# Patient Record
Sex: Male | Born: 1950 | Race: White | Hispanic: No | Marital: Single | Smoking: Former smoker
Health system: Southern US, Community
[De-identification: ages and names within clinical notes are randomized; demographics above are authoritative.]

## PROBLEM LIST (undated history)

## (undated) DIAGNOSIS — J449 Chronic obstructive pulmonary disease, unspecified: Secondary | ICD-10-CM

## (undated) DIAGNOSIS — I639 Cerebral infarction, unspecified: Secondary | ICD-10-CM

## (undated) DIAGNOSIS — E119 Type 2 diabetes mellitus without complications: Secondary | ICD-10-CM

## (undated) DIAGNOSIS — Z8619 Personal history of other infectious and parasitic diseases: Secondary | ICD-10-CM

## (undated) DIAGNOSIS — I2699 Other pulmonary embolism without acute cor pulmonale: Secondary | ICD-10-CM

## (undated) DIAGNOSIS — I10 Essential (primary) hypertension: Secondary | ICD-10-CM

## (undated) HISTORY — PX: CATARACT EXTRACTION: SUR2

## (undated) HISTORY — PX: BACK SURGERY: SHX140

## (undated) HISTORY — PX: KNEE SURGERY: SHX244

## (undated) HISTORY — PX: CERVICAL FUSION: SHX112

## (undated) HISTORY — PX: HIP PINNING: SHX1757

---

## 2006-06-12 ENCOUNTER — Ambulatory Visit: Payer: Self-pay | Admitting: Cardiology

## 2008-03-19 ENCOUNTER — Inpatient Hospital Stay (HOSPITAL_COMMUNITY): Admission: EM | Admit: 2008-03-19 | Discharge: 2008-03-21 | Payer: Self-pay | Admitting: Emergency Medicine

## 2008-03-21 ENCOUNTER — Encounter (INDEPENDENT_AMBULATORY_CARE_PROVIDER_SITE_OTHER): Payer: Self-pay | Admitting: Emergency Medicine

## 2010-01-07 IMAGING — CR DG CHEST 1V PORT
1 series · 1 of 1 positions shown · non-contrast
Comparison: None.

CLINICAL DATA: Chest pain.  History of hypertension and diabetes.
Smoker.

PORTABLE CHEST - 1 VIEW [DATE]/2110 1111 hours:

[AP]
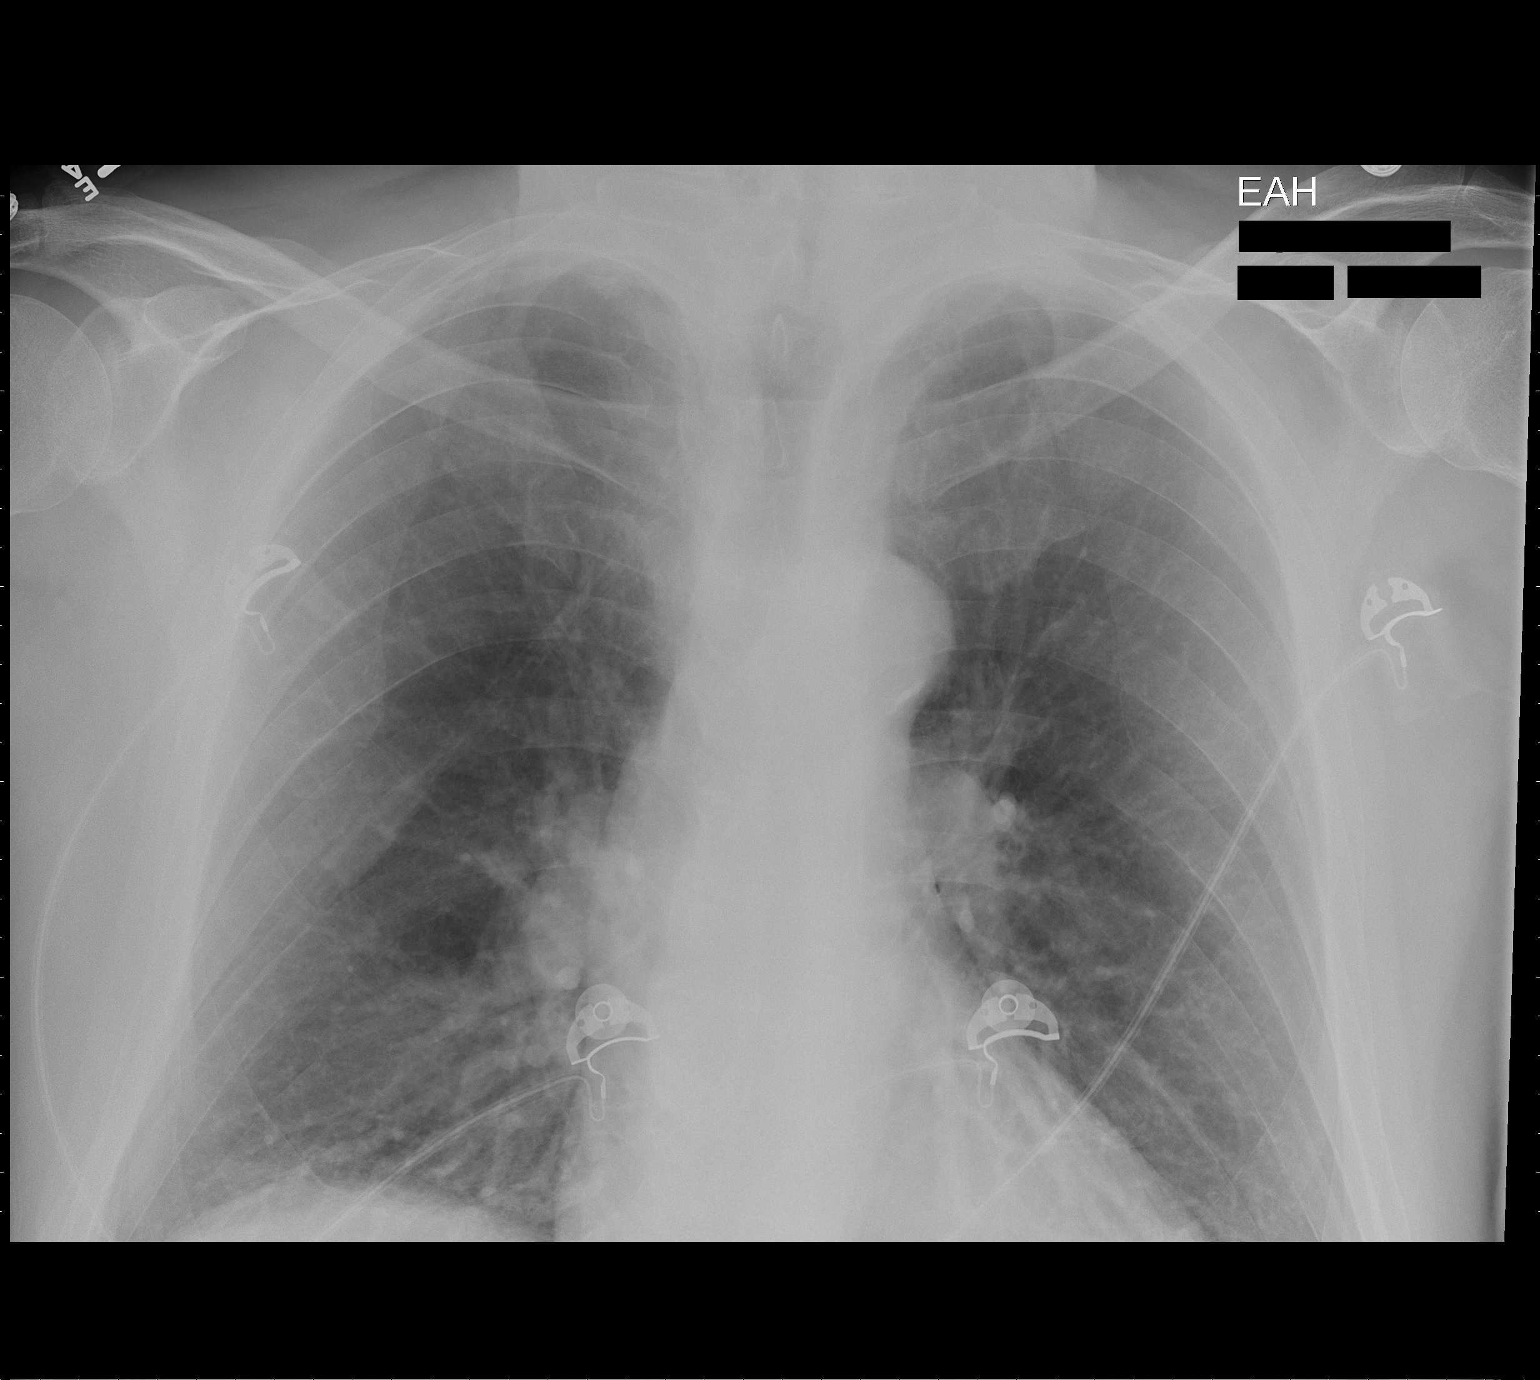

[1 of 1 positions shown; findings below may reference images not displayed]

FINDINGS: Heart size upper normal to perhaps slightly enlarged for
the AP portable technique.  Thoracic aorta mildly atherosclerotic.
Hilar and mediastinal contours otherwise unremarkable.  Pulmonary
vascularity normal.  Early emphysematous changes suspected in the
upper lobes, right greater than left, with vascular redistribution
to the mid and lower lungs.  No localized airspace consolidation.
No visible pleural effusions.
IMPRESSION: Probable COPD/emphysema.  No acute cardiopulmonary disease.

## 2010-08-07 NOTE — H&P (Signed)
NAME:  KYLAR, LEONHARDT                 ACCOUNT NO.:  [id number]   MEDICAL RECORD NO.:  [id number]          PATIENT TYPE:  INP   LOCATION:  2001                         FACILITY:  [facility]   PHYSICIAN:  Lonia Blood, M.D.      DATE OF BIRTH:  02-Mar-1951   DATE OF ADMISSION:  03/18/2008  DATE OF DISCHARGE:                              HISTORY & PHYSICAL   PRIMARY CARE PHYSICIAN:  Production assistant, radio.   PRESENTING COMPLAINT:  Chest pain.   HISTORY OF PRESENT ILLNESS:  The patient is a 60 year old gentleman with  multiple risk factors for coronary artery disease including  hypertension, dyslipidemia, diabetes according to the patient as well as  some tobacco abuse who presented with bilateral chest pain.  Pain was  rated as 6 to 7 out of 10.  Involved left and right side of the chest.  No aggravating or relieving factor.  Also associated with some shortness  of breath.  No fever, no cough.  He reportedly had a similar episode 14  years ago where he was cathed, we have no records of that.  He denied  any nausea, vomiting or diaphoresis.  He denied any abdominal pain, he  denied any diarrhea, constipation.  Patient has history of restless leg  syndrome and apparently has had problem with that with some swelling of  his lower extremities.  His chest pain has been relieved with the use of  aspirin and sublingual nitroglycerin.   PAST MEDICAL HISTORY:  1. Dyslipidemia.  2. Hypertension.  3. Tobacco abuse.  4. Prior history of previous coronary artery disease although the      patient said there was no stents or any blockage seen back 14 years      ago when he had a cath.   ALLERGIES:  He has NO KNOWN DRUG ALLERGIES.   MEDICATIONS:  Include simvastatin and some blood pressure medicine the  patient is not aware of.  The patient also reportedly has diabetes but  not on any medication.   SOCIAL HISTORY:  Patient is married and lives in [place] with his wife.  He smokes about a half to  one pack per day.  Denied any alcohol or IV  drug use.   FAMILY HISTORY:  No significant history of coronary artery disease.   REVIEW OF SYSTEMS:  A 14-point review of system was negative except per  HPI.   EXAM:  Temperature 97.4, blood pressure 125/76, pulse 78, respiratory  rate 18, sats 96% on room air.  GENERAL:  He is awake, alert, oriented, pleasant man in no acute  distress.  HEENT:  PERRL, EOMI.  NECK:  Supple, no JVD, no lymphadenopathy.  RESPIRATORY:  He has good air entry bilaterally, no wheezes, no rales.  CARDIOVASCULAR:  He has S1-S2, no murmur.  ABDOMEN:  Soft and nontender with positive bowel sounds.  EXTREMITIES:  No edema, cyanosis or clubbing.   LABS:  White count 7.2, hemoglobin 14.5, platelet count 317.  PT 12.4,  INR 0.9.  Sodium 138, potassium 4, chloride 105, CO2 26, glucose 100,  BUN 14, creatinine  0.17 and calcium 9.2.  CK 133, MB 2.8.  Magnesium  2.1, troponin 0.02.  Urine drug screen is negative.  Chest x-ray shows  probable COPD and emphysema, no acute cardiopulmonary disease.  EKG  showed no acute changes.   ASSESSMENT:  Therefore this a 60 year old gentleman with risk factors  significant for coronary artery disease presenting with what appears to  be chest pain.  Other possible differentials is pulmonary embolism or  esophagitis.   PLAN:  1. Chest pain.  Will admit the patient for MI rule out.  Will keep him      as an observation.  I will also rule out PE by doing a chest CT      with contrast for PE protocol.  Also put on nitroglycerin, pain      control.  I will also put him some Lovenox.  If patient's enzymes      are negative he is a high risk patient that still will require a      stress test at this point at minimum.  2. Tobacco abuse.  The patient has been counseled and will be offered      nicotine patch as needed.  3. Dyslipidemia.  Will check fasting lipid panel and continue with      simvastatin in the hospital.  4. Hypertension,  blood pressure seems well controlled now.  Not clear      what the patient is taking at home but we will get his drug list      and continue with his home therapy as much as possible.  In the      meantime I will put him some beta blockers.  Otherwise, further      treatment will depend on how the patient does in the hospital.      Lonia Blood, M.D.  Electronically Signed     LG/MEDQ  D:  03/19/2008  T:  03/19/2008  Job:  [id number]

## 2010-08-07 NOTE — Discharge Summary (Signed)
Aaron Leon, Aaron Leon                 ACCOUNT NO.:  [id number]   MEDICAL RECORD NO.:  [id number]          PATIENT TYPE:  INP   LOCATION:  2001                         FACILITY:  [facility]   PHYSICIAN:  Richarda Overlie, MD       DATE OF BIRTH:  08-Jul-1950   DATE OF ADMISSION:  03/18/2008  DATE OF DISCHARGE:  03/21/2008                               DISCHARGE SUMMARY   DISCHARGE DIAGNOSES:  1. Chest pain, ruled out for acute coronary syndrome.  2. Nicotine dependence.  3. Dyslipidemia.  4. Hypertension.   SUBJECTIVE:  This is a 60 year old male with multiple cardiovascular  risk factors who presents to the ER with a chief complaint of pain 7/10,  in the left and the right side of his chest, described as crampy  throbbing pain without any aggravating or relieving factors, not  associated with any fever.  He did not describe any increasing frequency  or duration of this pain.  He did state that he had a similar episode 14  years ago for which he had a cardiac cath of which no records are  available.  In the ER, the patient was found to be hemodynamically  stable.  Chest x-ray showed COPD with emphysema.  No acute  cardiopulmonary disease.  EKG was normal.  Because of his history of  smoking, the patient had a CT angio that did not show any evidence of  pulmonary embolism, just mild cardiomegaly and a small hiatal hernia.  [facility] was consulted who recommended a  cardiac cath because of his multiple risk factors.  The patient was  found to have an ejection fraction of 55%, no mitral regurgitation or  aortic stenosis.  No significant coronary artery disease, just moderate  RCA/LAD disease and mild disease of the LAD and the diagonal.  Medical  management was recommended and the patient was advised to be observed  for 4-5 hours and then to be sent home.  The patient has done well post  cardiac cath without any obvious bleeding or complications.  He is  hemodynamically stable at the time of discharge.  Discharge vital signs:  139/77, pulse 68, respirations 18, temperature 98.4, 92% on room air.   DISCHARGE MEDICATIONS:  1. Simvastatin 80 mg p.o. daily.  2. Norvasc 10 mg p.o. daily.  3. Nicotine patch 21 mg per day x6 weeks, 40 mg per day x2 weeks and 7      mg per day x2 weeks.  4. Toprol XL 50 mg p.o. daily.  5. Diclofenac 75 mg p.o. twice a day.  6. Zolpidem 10 mg p.o. daily.  7. Gabapentin 300 mg four times a day.  8. Oxybutynin 5 mg p.o. daily.  9. Nexium 40 mg p.o. daily.  10.Plavix 75 mg p.o. daily.  11.Zoloft 10 mg p.o. daily.  12.Famotidine 40 mg p.o. daily.  13.Moexipril 50 mg twice a day.  14.ProAir as needed.  15.Hold glyburide and metformin until March 24, 2008.   Follow up with [facility] in 5-7 days.  The patient  advised to call his  PCP and set up this appointment because of late  discharge.      Richarda Overlie, MD  Electronically Signed     NA/MEDQ  D:  03/21/2008  T:  03/21/2008  Job:  [id number]

## 2010-08-07 NOTE — Cardiovascular Report (Signed)
NAME:  HART, HAAS NO.:  [id number]   MEDICAL RECORD NO.:  [id number]          PATIENT TYPE:  INP   LOCATION:  2001                         FACILITY:  [facility]   PHYSICIAN:  Antonieta Iba, MD   DATE OF BIRTH:  1950/12/19   DATE OF PROCEDURE:  03/19/2008  DATE OF DISCHARGE:  03/21/2008                            CARDIAC CATHETERIZATION   REFERRING PHYSICIAN:  Vonna Kotyk R. Jacinto Halim, MD   PRIMARY CARE PHYSICIAN:  Ursula Beath, MD   PERFORMING PHYSICIAN:  Antonieta Iba, MD   INDICATIONS FOR PROCEDURE:  Aaron Leon is a very pleasant 60 year old  gentleman with a history of diabetes, hyperlipidemia with recent  episodes of chest discomfort.  He presents to the cardiac  catheterization lab for evaluation of his coronary anatomy.   PROCEDURE DETAILS:  The risks and benefits of the procedure was  discussed with [name] and consent was obtained.  He was brought to  the catheterization lab and prepped and draped in usual sterile fashion.  A modified Seldinger technique was used to engage the right femoral  artery and a 6-French Judkins left #4 and right #4 catheter were used to  engage the left main and right coronary artery respectively.  Hand  injection of contrast was used to visualize the coronary anatomy and  recorded with cinematography.  A 6-French pigtail catheter was used to  cross the aortic valve into the left ventricle and injection under  cinematography was used to record the left ventricular systolic  function.  At the end of the case, the catheters and sheaths were  removed and manual compression was held to obtain hemostasis.  No  complications were reported.   CORONARY ANATOMY:  Left main:  The left main is a moderate-to-large size  vessel that bifurcates into the LAD and the left circumflex.  There is  no significant disease noted.   Left anterior descending:  The LAD is a moderate-to-large sized vessel  that extends to the apex.  There is one  diagonal branch.  The LAD has  30% mid and 60% mid to distal LAD disease.  There is 40% proximal  diagonal disease that is a long tubular lesion.   Left circumflex:  The left circumflex is a moderate-sized vessel that  has 2 obtuse marginal branches.  There is no significant disease noted.   Ramus/high obtuse marginal branch:  Moderate-sized vessel that has no  significant disease.   Right coronary artery:  The RCA is a dominant branch that extends  distally and bifurcates with a PDA and PO branch.  There is 50% mid to  distal RCA disease prior to bifurcation of the PO and PD branches.   On LV gram:  The ejection fraction estimated at 55%.  There is no  significant mitral regurgitation or aortic stenosis.   FINAL IMPRESSION:  Mild-to-moderate right coronary artery and left  anterior descending disease with mid left anterior descending and the  proximal diagonal disease.  Medical management is recommended.  The  patient will remain flat for 4-5 hours and will be discharged today.  He  will followup in 1 weeks' time with Dr. Jacinto Halim for further evaluation of  his medications.      Antonieta Iba, MD  Electronically Signed     TJG/MEDQ  D:  03/23/2008  T:  03/24/2008  Job:  [id number]   cc:   Ursula Beath, MD

## 2010-12-28 LAB — CBC
HCT: 44.6 % (ref 39.0–52.0)
Hemoglobin: 14.5 g/dL (ref 13.0–17.0)
MCHC: 32.9 g/dL (ref 30.0–36.0)
MCHC: 33.2 g/dL (ref 30.0–36.0)
MCV: 80 fL (ref 78.0–100.0)
MCV: 80.5 fL (ref 78.0–100.0)
Platelets: 281 10*3/uL (ref 150–400)
Platelets: 317 10*3/uL (ref 150–400)
RBC: 5.2 MIL/uL (ref 4.22–5.81)
RDW: 18.2 % — ABNORMAL HIGH (ref 11.5–15.5)
WBC: 7.2 10*3/uL (ref 4.0–10.5)

## 2010-12-28 LAB — GLUCOSE, CAPILLARY
Glucose-Capillary: 132 mg/dL — ABNORMAL HIGH (ref 70–99)
Glucose-Capillary: 137 mg/dL — ABNORMAL HIGH (ref 70–99)
Glucose-Capillary: 155 mg/dL — ABNORMAL HIGH (ref 70–99)
Glucose-Capillary: 163 mg/dL — ABNORMAL HIGH (ref 70–99)
Glucose-Capillary: 207 mg/dL — ABNORMAL HIGH (ref 70–99)

## 2010-12-28 LAB — CARDIAC PANEL(CRET KIN+CKTOT+MB+TROPI)
Relative Index: INVALID (ref 0.0–2.5)
Troponin I: 0.01 ng/mL (ref 0.00–0.06)

## 2010-12-28 LAB — COMPREHENSIVE METABOLIC PANEL
AST: 14 U/L (ref 0–37)
CO2: 25 mEq/L (ref 19–32)
Calcium: 8.5 mg/dL (ref 8.4–10.5)
Creatinine, Ser: 0.73 mg/dL (ref 0.4–1.5)
GFR calc Af Amer: >60 mL/min (ref >60)
GFR calc non Af Amer: >60 mL/min (ref >60)

## 2010-12-28 LAB — HEMOGLOBIN A1C
Hgb A1c MFr Bld: 6.8 % — ABNORMAL HIGH (ref 4.6–6.1)
Mean Plasma Glucose: 148 mg/dL

## 2010-12-28 LAB — POCT I-STAT, CHEM 8
BUN: 16 mg/dL (ref 6–23)
HCT: 46 % (ref 39.0–52.0)
Hemoglobin: 15.6 g/dL (ref 13.0–17.0)
Sodium: 140 mEq/L (ref 135–145)
TCO2: 27 mmol/L (ref 0–100)

## 2010-12-28 LAB — BASIC METABOLIC PANEL
BUN: 7 mg/dL (ref 6–23)
CO2: 25 mEq/L (ref 19–32)
Chloride: 108 mEq/L (ref 96–112)
Creatinine, Ser: 0.65 mg/dL (ref 0.4–1.5)
GFR calc Af Amer: >60 mL/min (ref >60)
GFR calc non Af Amer: >60 mL/min (ref >60)
Glucose, Bld: 142 mg/dL — ABNORMAL HIGH (ref 70–99)
Potassium: 4 mEq/L (ref 3.5–5.1)
Sodium: 138 mEq/L (ref 135–145)

## 2010-12-28 LAB — CK TOTAL AND CKMB (NOT AT ARMC)
CK, MB: 1.9 ng/mL (ref 0.3–4.0)
Relative Index: 2.1 (ref 0.0–2.5)
Relative Index: 2.3 (ref 0.0–2.5)
Relative Index: INVALID (ref 0.0–2.5)
Total CK: 87 U/L (ref 7–232)

## 2010-12-28 LAB — POCT CARDIAC MARKERS: Myoglobin, poc: 49.8 ng/mL (ref 12–200)

## 2010-12-28 LAB — TSH: TSH: 1.694 u[IU]/mL (ref 0.350–4.500)

## 2010-12-28 LAB — RAPID URINE DRUG SCREEN, HOSP PERFORMED
Cocaine: NOT DETECTED
Tetrahydrocannabinol: NOT DETECTED

## 2010-12-28 LAB — TROPONIN I: Troponin I: <0.01 ng/mL (ref 0.00–0.06)

## 2010-12-28 LAB — DIFFERENTIAL
Eosinophils Relative: 2 % (ref 0–5)
Lymphocytes Relative: 23 % (ref 12–46)
Lymphs Abs: 1.8 10*3/uL (ref 0.7–4.0)
Neutrophils Relative %: 65 % (ref 43–77)

## 2010-12-28 LAB — B-NATRIURETIC PEPTIDE (CONVERTED LAB): Pro B Natriuretic peptide (BNP): <30 pg/mL (ref 0.0–100.0)

## 2010-12-28 LAB — APTT: aPTT: 28 seconds (ref 24–37)

## 2010-12-28 LAB — MAGNESIUM
Magnesium: 1.9 mg/dL (ref 1.5–2.5)
Magnesium: 2.1 mg/dL (ref 1.5–2.5)

## 2018-06-01 ENCOUNTER — Other Ambulatory Visit: Payer: Self-pay | Admitting: Internal Medicine

## 2018-06-01 DIAGNOSIS — Z0189 Encounter for other specified special examinations: Secondary | ICD-10-CM

## 2018-07-08 ENCOUNTER — Other Ambulatory Visit: Payer: Self-pay

## 2018-07-08 ENCOUNTER — Ambulatory Visit (INDEPENDENT_AMBULATORY_CARE_PROVIDER_SITE_OTHER): Payer: No Typology Code available for payment source

## 2018-07-08 DIAGNOSIS — Z0189 Encounter for other specified special examinations: Secondary | ICD-10-CM | POA: Diagnosis not present

## 2019-06-19 ENCOUNTER — Encounter (HOSPITAL_COMMUNITY): Payer: Self-pay | Admitting: Emergency Medicine

## 2019-06-19 ENCOUNTER — Other Ambulatory Visit: Payer: Self-pay

## 2019-06-19 ENCOUNTER — Emergency Department (HOSPITAL_COMMUNITY)
Admission: EM | Admit: 2019-06-19 | Discharge: 2019-06-19 | Disposition: A | Discharge status: Home / Self Care | Payer: No Typology Code available for payment source | Attending: Emergency Medicine | Admitting: Emergency Medicine

## 2019-06-19 DIAGNOSIS — I10 Essential (primary) hypertension: Secondary | ICD-10-CM | POA: Diagnosis not present

## 2019-06-19 DIAGNOSIS — Z87891 Personal history of nicotine dependence: Secondary | ICD-10-CM | POA: Insufficient documentation

## 2019-06-19 DIAGNOSIS — Z794 Long term (current) use of insulin: Secondary | ICD-10-CM | POA: Diagnosis not present

## 2019-06-19 DIAGNOSIS — R1031 Right lower quadrant pain: Secondary | ICD-10-CM | POA: Diagnosis present

## 2019-06-19 DIAGNOSIS — J449 Chronic obstructive pulmonary disease, unspecified: Secondary | ICD-10-CM | POA: Insufficient documentation

## 2019-06-19 DIAGNOSIS — K439 Ventral hernia without obstruction or gangrene: Secondary | ICD-10-CM | POA: Insufficient documentation

## 2019-06-19 DIAGNOSIS — Z79899 Other long term (current) drug therapy: Secondary | ICD-10-CM | POA: Insufficient documentation

## 2019-06-19 DIAGNOSIS — E119 Type 2 diabetes mellitus without complications: Secondary | ICD-10-CM | POA: Diagnosis not present

## 2019-06-19 DIAGNOSIS — Z7982 Long term (current) use of aspirin: Secondary | ICD-10-CM | POA: Diagnosis not present

## 2019-06-19 HISTORY — DX: Chronic obstructive pulmonary disease, unspecified: J44.9

## 2019-06-19 HISTORY — DX: Essential (primary) hypertension: I10

## 2019-06-19 HISTORY — DX: Type 2 diabetes mellitus without complications: E11.9

## 2019-06-19 HISTORY — DX: Cerebral infarction, unspecified: I63.9

## 2019-06-19 NOTE — ED Triage Notes (Signed)
Pt c/o right sided groin pain that started 1 month ago. Pt states the pain has gotten worse the last 2 weeks. Pt has known hernia. Pt stating that it is tender to touch.

## 2019-06-19 NOTE — Discharge Instructions (Signed)
Follow up with the surgeon on Monday as scheduled

## 2019-06-19 NOTE — ED Provider Notes (Signed)
[facility] EMERGENCY DEPARTMENT Provider Note   CSN: [id number] Arrival date & time: 06/19/19  2011     History Chief Complaint  Patient presents with  . Groin Pain    Aaron Leon is a 69 y.o. male.  The history is provided by the patient. No language interpreter was used.  Groin Pain This is a recurrent problem. The problem occurs constantly. The problem has been gradually worsening. Nothing aggravates the symptoms. Nothing relieves the symptoms. He has tried nothing for the symptoms. The treatment provided no relief.   Pt complains of a hernia to right groin.  Pt reports if he stands up it come out.  If he lays down it goes back in.  Pt is scheduled for a consultation with the surgeon at the [facility] on Monday.  Pt recently released from hospital after a fall.     Past Medical History:  Diagnosis Date  . COPD (chronic obstructive pulmonary disease) (HCC)   . Diabetes mellitus without complication (HCC)    "type 2"  . Hypertension   . Stroke [facility])     There are no problems to display for this patient.   Past Surgical History:  Procedure Laterality Date  . BACK SURGERY    . CATARACT EXTRACTION    . CERVICAL FUSION    . KNEE SURGERY         No family history on file.  Social History   Tobacco Use  . Smoking status: Former Smoker    Types: E-cigarettes  . Smokeless tobacco: Never Used  Substance Use Topics  . Alcohol use: Not Currently  . Drug use: Never    Home Medications Prior to Admission medications   Medication Sig Start Date End Date Taking? Authorizing Provider  acetaminophen (TYLENOL) 500 MG tablet Take 500 mg by mouth in the morning and at bedtime.   Yes [provider]  albuterol (PROVENTIL) (2.5 MG/3ML) 0.083% nebulizer solution Take 2.5 mg by nebulization every 6 (six) hours as needed for wheezing or shortness of breath.   Yes [provider]  albuterol (VENTOLIN HFA) 108 (90 Base) MCG/ACT inhaler Inhale 1 puff into the  lungs every 6 (six) hours as needed for wheezing or shortness of breath.   Yes [provider]  amLODipine (NORVASC) 5 MG tablet Take 5 mg by mouth daily.   Yes [provider]  ascorbic acid (VITAMIN C) 500 MG tablet Take 500 mg by mouth daily.   Yes [provider]  aspirin 325 MG tablet Take 325 mg by mouth daily.   Yes [provider]  atorvastatin (LIPITOR) 80 MG tablet Take 80 mg by mouth at bedtime.    Yes [provider]  budesonide-formoterol (SYMBICORT) 80-4.5 MCG/ACT inhaler Inhale 2 puffs into the lungs 2 (two) times daily.   Yes [provider]  cetirizine (ZYRTEC) 10 MG tablet Take 10 mg by mouth in the morning.    Yes [provider]  cholecalciferol (VITAMIN D3) 25 MCG (1000 UNIT) tablet Take 1,000 Units by mouth in the morning and at bedtime.   Yes [provider]  ferrous sulfate 325 (65 FE) MG EC tablet Take 325 mg by mouth in the morning.    Yes [provider]  flunisolide (NASALIDE) 25 MCG/ACT (0.025%) SOLN Place 2 sprays into the nose 2 (two) times daily.   Yes [provider]  gabapentin (NEURONTIN) 300 MG capsule Take 600-900 mg by mouth See admin instructions. 600mg  in the morning and  at noon, then take 900mg  in the evening   Yes [provider]  insulin aspart (NOVOLOG) 100 UNIT/ML injection Inject 1-2 Units into the skin 3 (three) times daily before meals. Per sliding scale   Yes [provider]  insulin detemir (LEVEMIR) 100 UNIT/ML injection Inject 40-50 Units into the skin at bedtime.   Yes [provider]  lisinopril (ZESTRIL) 10 MG tablet Take 10 mg by mouth daily.   Yes [provider]  melatonin 3 MG TABS tablet Take 3 mg by mouth at bedtime.   Yes [provider]  metFORMIN (GLUCOPHAGE) 1000 MG tablet Take 1,000 mg by mouth 2 (two) times daily with a meal.   Yes [provider]  methocarbamol (ROBAXIN) 500 MG tablet Take  500 mg by mouth 4 (four) times daily.   Yes [provider]  metoprolol tartrate (LOPRESSOR) 25 MG tablet Take 25 mg by mouth 2 (two) times daily.   Yes [provider]  omeprazole (PRILOSEC) 20 MG capsule Take 20 mg by mouth in the morning.    Yes [provider]  oxybutynin (DITROPAN) 5 MG tablet Take 5 mg by mouth at bedtime.    Yes [provider]  oxyCODONE-acetaminophen (PERCOCET) 10-325 MG tablet Take 1 tablet by mouth every 6 (six) hours as needed for pain.   Yes [provider]  polyethylene glycol powder (GLYCOLAX/MIRALAX) 17 GM/SCOOP powder Take 17 g by mouth daily as needed for mild constipation or moderate constipation.   Yes [provider]  sertraline (ZOLOFT) 100 MG tablet Take 100 mg by mouth in the morning.    Yes [provider]  tamsulosin (FLOMAX) 0.4 MG CAPS capsule Take 0.4 mg by mouth at bedtime.   Yes [provider]  tiotropium (SPIRIVA) 18 MCG inhalation capsule Place 18 mcg into inhaler and inhale in the morning.    Yes [provider]    Allergies    Barbiturates, Seroquel  [quetiapine], and Shellfish allergy  Review of Systems   Review of Systems  All other systems reviewed and are negative.   Physical Exam Updated Vital Signs BP (!) 149/78 (BP Location: Left Arm)   Pulse 73   Temp 98.2 F (36.8 C) (Oral)   Resp 15   Ht 6' (1.829 m)   Wt 74.8 kg   SpO2 95%   BMI 22.38 kg/m   Physical Exam Vitals and nursing note reviewed.  Constitutional:      Appearance: He is well-developed.  HENT:     Head: Normocephalic and atraumatic.  Eyes:     Conjunctiva/sclera: Conjunctivae normal.  Cardiovascular:     Rate and Rhythm: Normal rate and regular rhythm.  Pulmonary:     Effort: Pulmonary effort is normal. No respiratory distress.  Abdominal:     Palpations: Abdomen is soft.     Tenderness: There is abdominal tenderness.     Comments: Palpable defect right lower abdomen,   No evidence of incarceration   Musculoskeletal:        General: Normal range of motion.  Skin:    General: Skin is warm.  Neurological:     General: No focal deficit present.     Mental Status: He is alert.  Psychiatric:        Mood and Affect: Mood normal.     ED Results / Procedures / Treatments   Labs (all labs ordered are listed, but only abnormal results are displayed) Labs Reviewed - No data to display  EKG None  Radiology No results found.  Procedures Procedures (including critical care time)  Medications Ordered in ED Medications - No data to display  ED Course  I have reviewed the triage vital signs and the nursing notes.  Pertinent labs & imaging results that were available during my care of the patient were reviewed by me and considered in my medical decision making (see chart for details).    MDM Rules/Calculators/A&P                       Final Clinical Impression(s) / ED Diagnoses Final diagnoses:  Hernia of abdominal wall    Rx / DC Orders ED Discharge Orders    None    An After Visit Summary was printed and given to the patient.    Osie Cheeks 06/19/19 2130    Raeford Razor, MD 06/20/19 [id number]

## 2019-06-19 NOTE — ED Notes (Signed)
Pt ambulated out of room before signing.

## 2019-06-30 ENCOUNTER — Other Ambulatory Visit (HOSPITAL_COMMUNITY): Payer: Self-pay | Admitting: Specialist

## 2019-06-30 DIAGNOSIS — G20 Parkinson's disease: Secondary | ICD-10-CM

## 2019-06-30 DIAGNOSIS — R2689 Other abnormalities of gait and mobility: Secondary | ICD-10-CM

## 2019-07-22 ENCOUNTER — Ambulatory Visit (HOSPITAL_COMMUNITY)
Admission: RE | Admit: 2019-07-22 | Discharge: 2019-07-22 | Disposition: A | Discharge status: Home / Self Care | Payer: No Typology Code available for payment source | Source: Ambulatory Visit | Attending: Specialist | Admitting: Specialist

## 2019-07-22 ENCOUNTER — Other Ambulatory Visit: Payer: Self-pay

## 2019-07-22 DIAGNOSIS — R2689 Other abnormalities of gait and mobility: Secondary | ICD-10-CM | POA: Diagnosis not present

## 2019-07-22 DIAGNOSIS — G20 Parkinson's disease: Secondary | ICD-10-CM | POA: Diagnosis present

## 2019-07-22 MED ORDER — IODINE STRONG (LUGOLS) 5 % PO SOLN
0.8000 mL | Freq: Once | ORAL | Status: AC
  Administered 2019-07-22: 09:00:00 0.8 mL via ORAL

## 2019-07-22 MED ORDER — IOFLUPANE I 123 185 MBQ/2.5ML IV SOLN
4.6000 | Freq: Once | INTRAVENOUS | Status: AC | PRN
  Administered 2019-07-22: 15:00:00 4.6 via INTRAVENOUS
  Filled 2019-07-22: qty 5

## 2019-07-22 MED ORDER — IODINE STRONG (LUGOLS) 5 % PO SOLN
ORAL | Status: AC
  Filled 2019-07-22: qty 1

## 2021-02-22 HISTORY — PX: TOTAL HIP ARTHROPLASTY: SHX124

## 2021-05-11 IMAGING — NM NM DATSCAN
4 series · 33 of 40 positions shown · non-contrast
Comparison: None.

CLINICAL DATA: 69-year-old male with RIGHT hand tremors. Short-term
memory deficit

EXAM:
NUCLEAR MEDICINE BRAIN IMAGING WITH SPECT  (DaTscan )
TECHNIQUE: SPECT images of the brain were obtained after intravenous injection
of radiopharmaceutical. 4 hour post injection imaging. Appropriate
positioning. 0.8 ml lugols solution administered in a.m
RADIOPHARMACEUTICALS:  4.6 millicuries I 123 Ioflupane

[Series 1: spect - (id) _(id)_cor · 4.1mm · 4.14mm/px · 5 of 128 frames shown]
[frame 11/128]
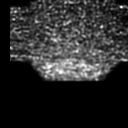
[frame 32/128]
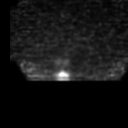
[frame 75/128]
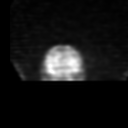
[frame 96/128]
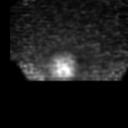
[frame 118/128]
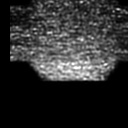

[Series 1: spect - (id) _(id) · 4.1mm · 4.14mm/px · 5 of 128 frames shown]
[frame 11/128]
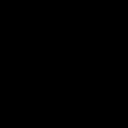
[frame 32/128]
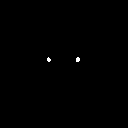
[frame 75/128]
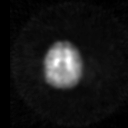
[frame 96/128]
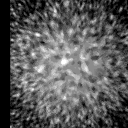
[frame 118/128]
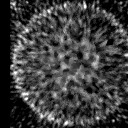

[Series 1: brain spect · 4.14mm/px · 5 of 120 frames shown]
[frame 11/120  full-range]
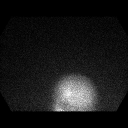
[frame 31/120  full-range]
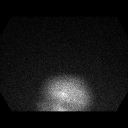
[frame 71/120  full-range]
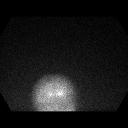
[frame 91/120  full-range]
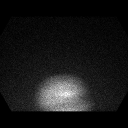
[frame 111/120  full-range]
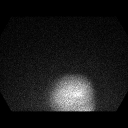

[Series 1013: mpr tra (id) range · 0.97mm/px · 18 of 28 slices shown]
[im 1/28]
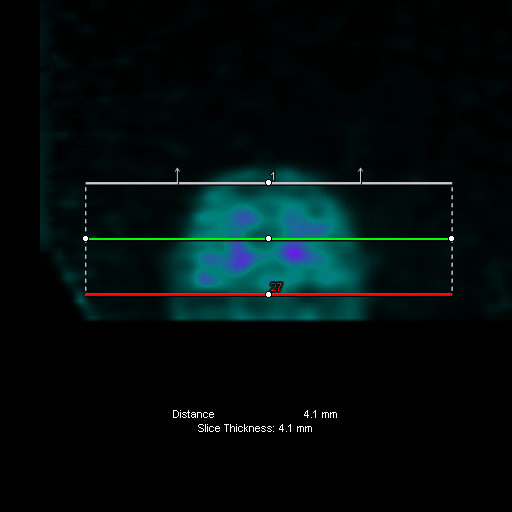
[im 3/28]
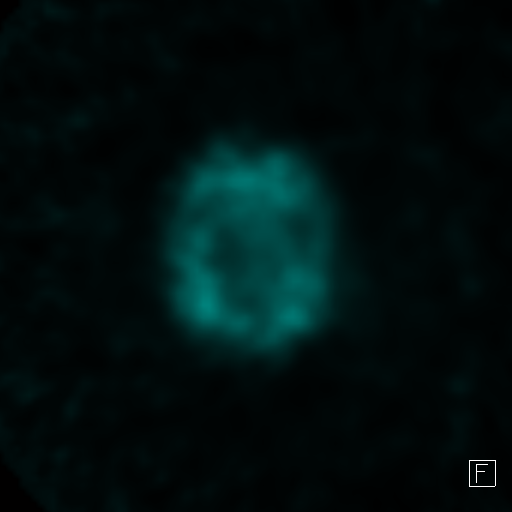
[im 4/28]
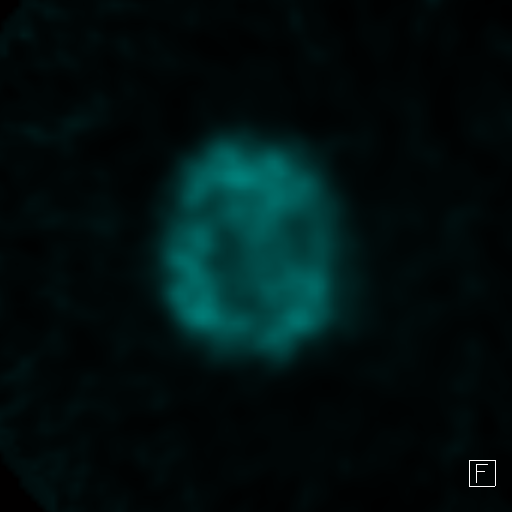
[im 6/28]
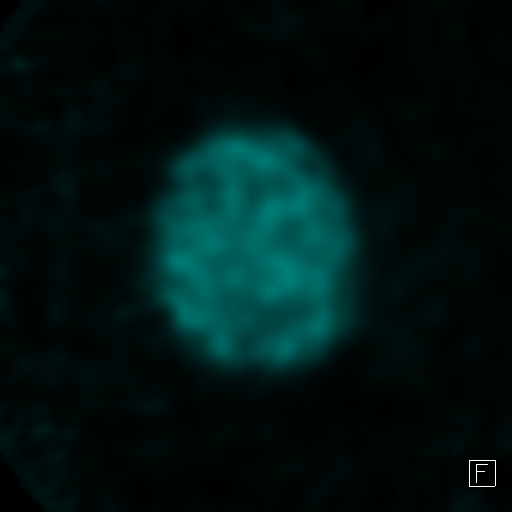
[im 7/28]
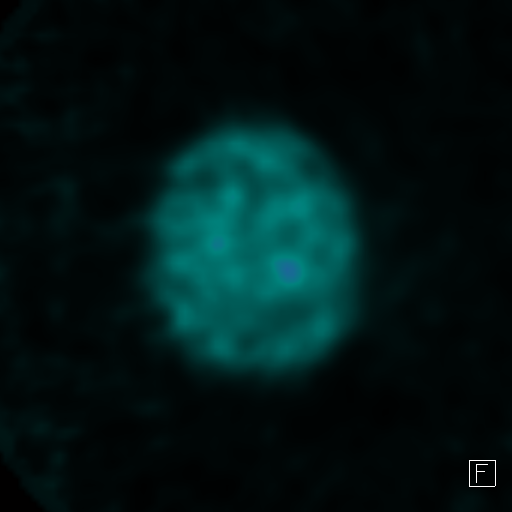
[im 8/28]
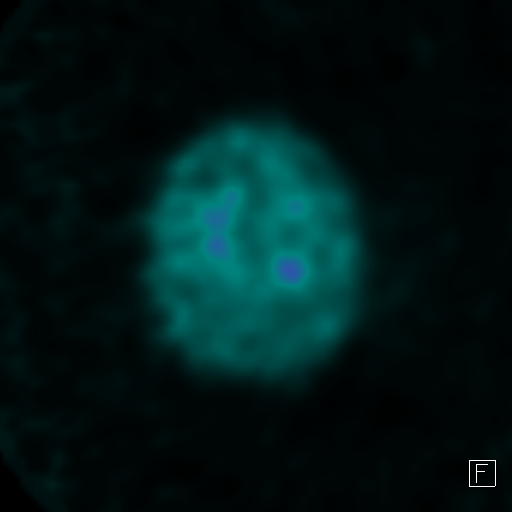
[im 11/28]
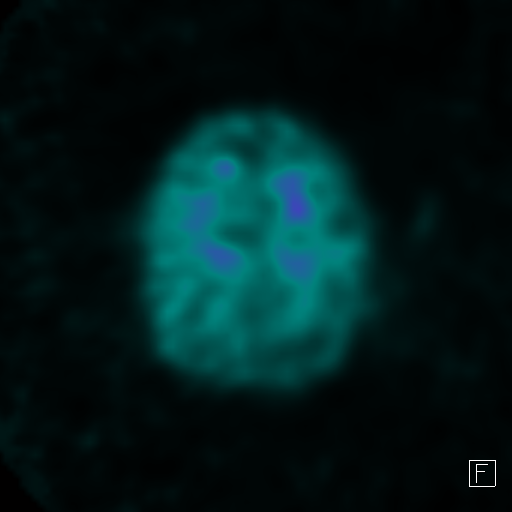
[im 12/28]
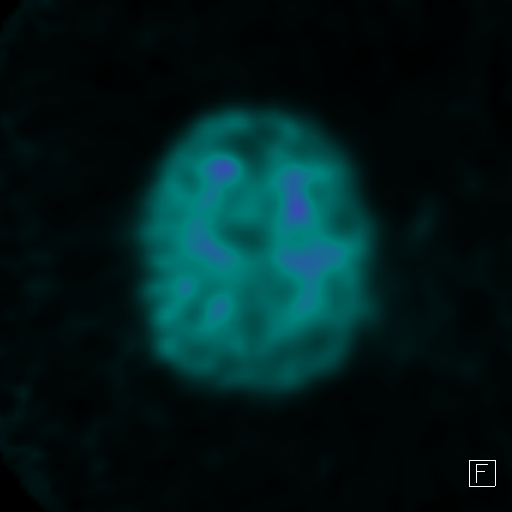
[im 13/28]
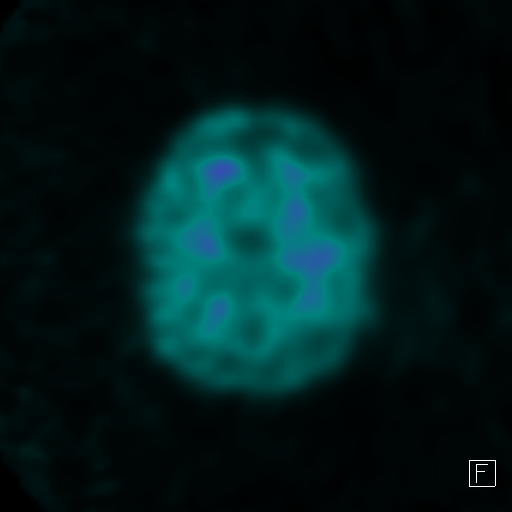
[im 15/28]
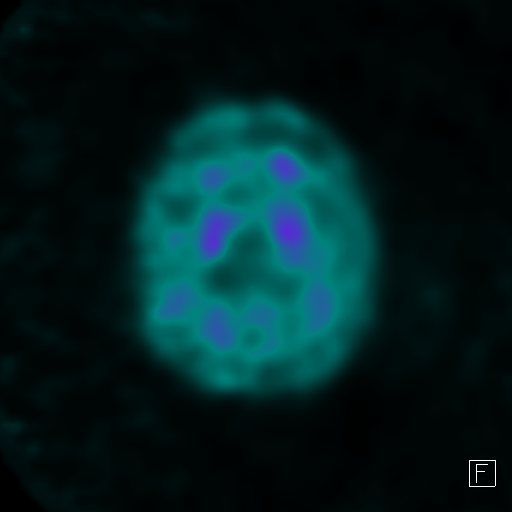
[im 16/28]
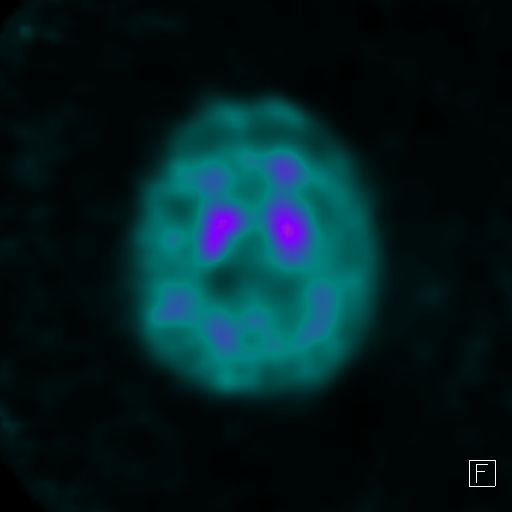
[im 19/28]
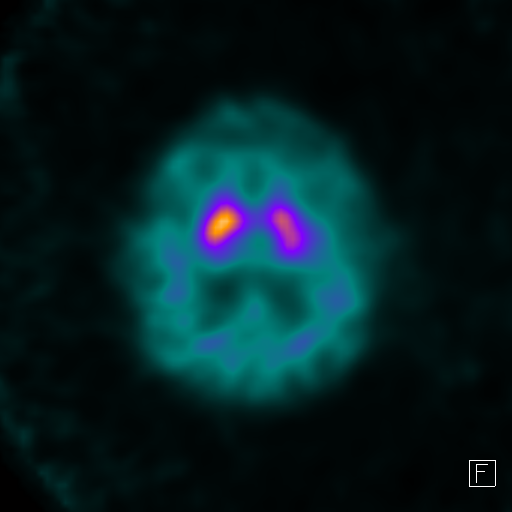
[im 20/28]
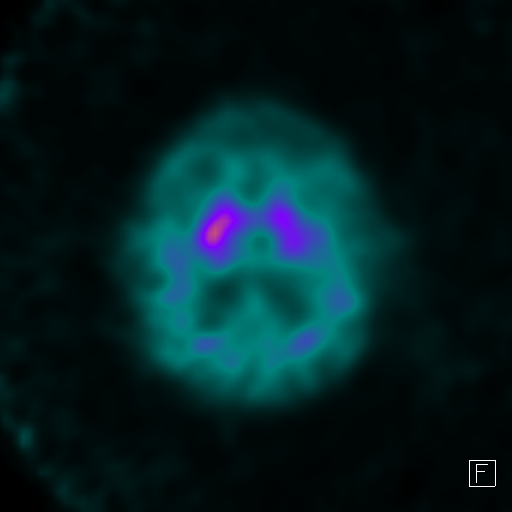
[im 21/28]
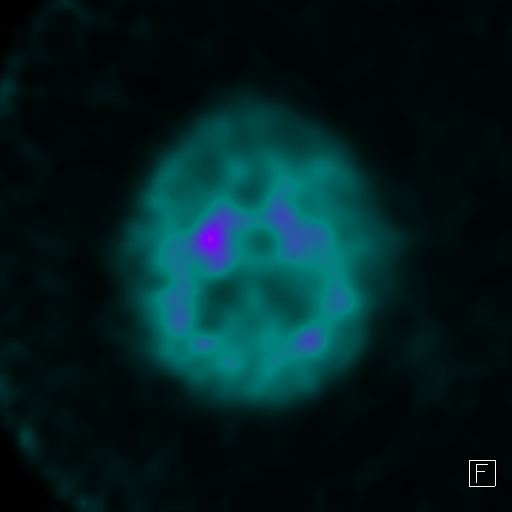
[im 22/28]
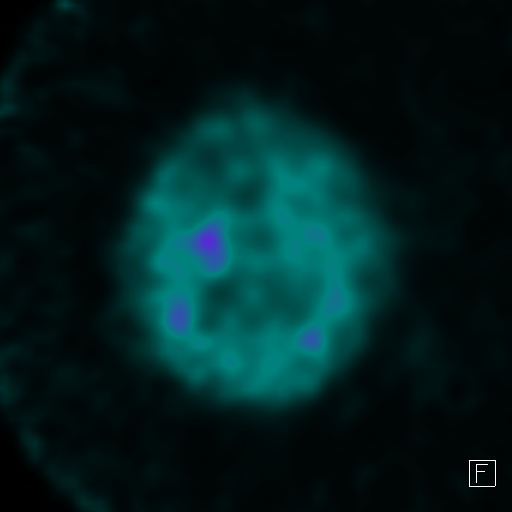
[im 24/28]
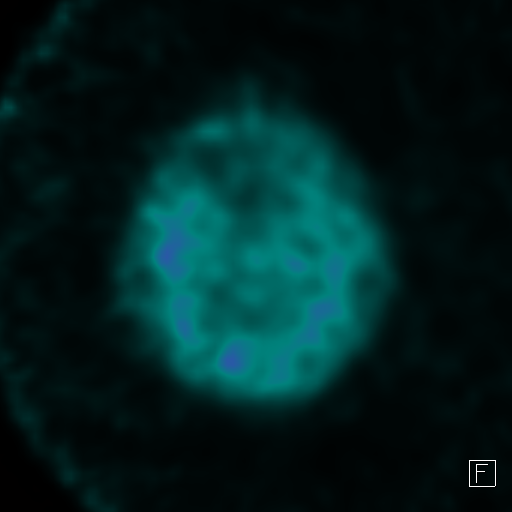
[im 26/28]
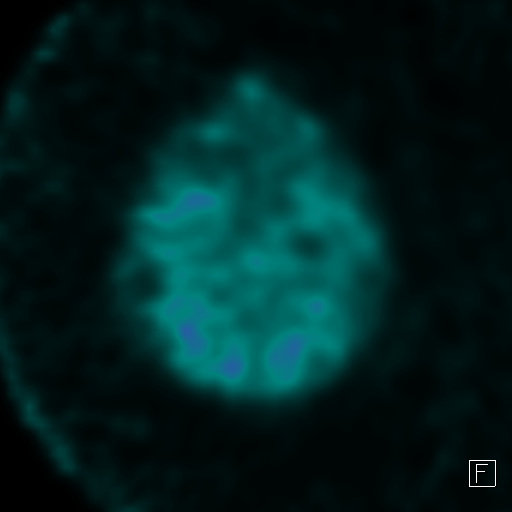
[im 28/28]
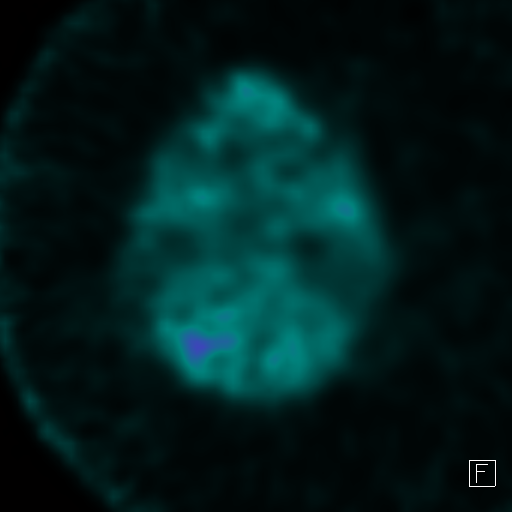

[33 of 40 positions shown; findings below may reference images not displayed]

FINDINGS: The LEFT and RIGHT striata are normal "comma" shaped. However, the
activity in the LEFT striatum is decreased relative to the RIGHT.
This decreases activity favored significant.
IMPRESSION: Asymmetric activity in normal-shaped striatum with LEFT striatal
activity decreased compared to RIGHT. Findings are concerning for
Parkinson's syndrome pathology.

## 2022-06-17 ENCOUNTER — Encounter (HOSPITAL_COMMUNITY): Payer: Self-pay | Admitting: Radiology

## 2022-06-17 ENCOUNTER — Emergency Department (HOSPITAL_COMMUNITY): Payer: No Typology Code available for payment source

## 2022-06-17 ENCOUNTER — Inpatient Hospital Stay (HOSPITAL_COMMUNITY)
Admission: EM | Admit: 2022-06-17 | Discharge: 2022-06-19 | DRG: 175 | Disposition: A | Discharge status: Home Health | Payer: No Typology Code available for payment source | Attending: Family Medicine | Admitting: Family Medicine

## 2022-06-17 ENCOUNTER — Other Ambulatory Visit: Payer: Self-pay

## 2022-06-17 DIAGNOSIS — Z8673 Personal history of transient ischemic attack (TIA), and cerebral infarction without residual deficits: Secondary | ICD-10-CM

## 2022-06-17 DIAGNOSIS — F03918 Unspecified dementia, unspecified severity, with other behavioral disturbance: Secondary | ICD-10-CM | POA: Diagnosis present

## 2022-06-17 DIAGNOSIS — E785 Hyperlipidemia, unspecified: Secondary | ICD-10-CM | POA: Diagnosis present

## 2022-06-17 DIAGNOSIS — I2699 Other pulmonary embolism without acute cor pulmonale: Secondary | ICD-10-CM

## 2022-06-17 DIAGNOSIS — E1165 Type 2 diabetes mellitus with hyperglycemia: Secondary | ICD-10-CM | POA: Diagnosis present

## 2022-06-17 DIAGNOSIS — G8929 Other chronic pain: Secondary | ICD-10-CM | POA: Diagnosis present

## 2022-06-17 DIAGNOSIS — G20A1 Parkinson's disease without dyskinesia, without mention of fluctuations: Secondary | ICD-10-CM | POA: Diagnosis present

## 2022-06-17 DIAGNOSIS — Z6821 Body mass index (BMI) 21.0-21.9, adult: Secondary | ICD-10-CM

## 2022-06-17 DIAGNOSIS — N319 Neuromuscular dysfunction of bladder, unspecified: Secondary | ICD-10-CM | POA: Diagnosis present

## 2022-06-17 DIAGNOSIS — Z794 Long term (current) use of insulin: Secondary | ICD-10-CM

## 2022-06-17 DIAGNOSIS — Z7189 Other specified counseling: Secondary | ICD-10-CM | POA: Diagnosis not present

## 2022-06-17 DIAGNOSIS — Z66 Do not resuscitate: Secondary | ICD-10-CM | POA: Diagnosis present

## 2022-06-17 DIAGNOSIS — R630 Anorexia: Secondary | ICD-10-CM | POA: Diagnosis present

## 2022-06-17 DIAGNOSIS — D75839 Thrombocytosis, unspecified: Secondary | ICD-10-CM | POA: Diagnosis present

## 2022-06-17 DIAGNOSIS — Y846 Urinary catheterization as the cause of abnormal reaction of the patient, or of later complication, without mention of misadventure at the time of the procedure: Secondary | ICD-10-CM | POA: Diagnosis present

## 2022-06-17 DIAGNOSIS — J440 Chronic obstructive pulmonary disease with acute lower respiratory infection: Secondary | ICD-10-CM | POA: Diagnosis present

## 2022-06-17 DIAGNOSIS — Z515 Encounter for palliative care: Secondary | ICD-10-CM | POA: Diagnosis not present

## 2022-06-17 DIAGNOSIS — T83511A Infection and inflammatory reaction due to indwelling urethral catheter, initial encounter: Secondary | ICD-10-CM | POA: Diagnosis present

## 2022-06-17 DIAGNOSIS — Z888 Allergy status to other drugs, medicaments and biological substances status: Secondary | ICD-10-CM

## 2022-06-17 DIAGNOSIS — R4189 Other symptoms and signs involving cognitive functions and awareness: Secondary | ICD-10-CM | POA: Diagnosis present

## 2022-06-17 DIAGNOSIS — R5381 Other malaise: Secondary | ICD-10-CM | POA: Diagnosis present

## 2022-06-17 DIAGNOSIS — E1140 Type 2 diabetes mellitus with diabetic neuropathy, unspecified: Secondary | ICD-10-CM | POA: Diagnosis present

## 2022-06-17 DIAGNOSIS — J189 Pneumonia, unspecified organism: Principal | ICD-10-CM

## 2022-06-17 DIAGNOSIS — J9611 Chronic respiratory failure with hypoxia: Secondary | ICD-10-CM | POA: Diagnosis present

## 2022-06-17 DIAGNOSIS — Z91013 Allergy to seafood: Secondary | ICD-10-CM

## 2022-06-17 DIAGNOSIS — E119 Type 2 diabetes mellitus without complications: Secondary | ICD-10-CM

## 2022-06-17 DIAGNOSIS — Z9981 Dependence on supplemental oxygen: Secondary | ICD-10-CM

## 2022-06-17 DIAGNOSIS — B965 Pseudomonas (aeruginosa) (mallei) (pseudomallei) as the cause of diseases classified elsewhere: Secondary | ICD-10-CM | POA: Diagnosis present

## 2022-06-17 DIAGNOSIS — R319 Hematuria, unspecified: Secondary | ICD-10-CM | POA: Diagnosis not present

## 2022-06-17 DIAGNOSIS — N50819 Testicular pain, unspecified: Secondary | ICD-10-CM | POA: Diagnosis present

## 2022-06-17 DIAGNOSIS — Z7951 Long term (current) use of inhaled steroids: Secondary | ICD-10-CM

## 2022-06-17 DIAGNOSIS — Z7984 Long term (current) use of oral hypoglycemic drugs: Secondary | ICD-10-CM

## 2022-06-17 DIAGNOSIS — I2693 Single subsegmental pulmonary embolism without acute cor pulmonale: Secondary | ICD-10-CM

## 2022-06-17 DIAGNOSIS — N433 Hydrocele, unspecified: Secondary | ICD-10-CM | POA: Diagnosis present

## 2022-06-17 DIAGNOSIS — J181 Lobar pneumonia, unspecified organism: Secondary | ICD-10-CM | POA: Diagnosis present

## 2022-06-17 DIAGNOSIS — N390 Urinary tract infection, site not specified: Secondary | ICD-10-CM

## 2022-06-17 DIAGNOSIS — D649 Anemia, unspecified: Secondary | ICD-10-CM | POA: Diagnosis present

## 2022-06-17 DIAGNOSIS — Z79899 Other long term (current) drug therapy: Secondary | ICD-10-CM

## 2022-06-17 DIAGNOSIS — N3289 Other specified disorders of bladder: Secondary | ICD-10-CM | POA: Diagnosis present

## 2022-06-17 DIAGNOSIS — Z1152 Encounter for screening for COVID-19: Secondary | ICD-10-CM

## 2022-06-17 DIAGNOSIS — R339 Retention of urine, unspecified: Secondary | ICD-10-CM | POA: Diagnosis present

## 2022-06-17 DIAGNOSIS — Z993 Dependence on wheelchair: Secondary | ICD-10-CM

## 2022-06-17 DIAGNOSIS — Z87891 Personal history of nicotine dependence: Secondary | ICD-10-CM

## 2022-06-17 DIAGNOSIS — L729 Follicular cyst of the skin and subcutaneous tissue, unspecified: Secondary | ICD-10-CM | POA: Diagnosis present

## 2022-06-17 DIAGNOSIS — I10 Essential (primary) hypertension: Secondary | ICD-10-CM | POA: Diagnosis present

## 2022-06-17 DIAGNOSIS — Z7982 Long term (current) use of aspirin: Secondary | ICD-10-CM

## 2022-06-17 DIAGNOSIS — I2510 Atherosclerotic heart disease of native coronary artery without angina pectoris: Secondary | ICD-10-CM | POA: Diagnosis present

## 2022-06-17 DIAGNOSIS — J188 Other pneumonia, unspecified organism: Secondary | ICD-10-CM

## 2022-06-17 DIAGNOSIS — F02818 Dementia in other diseases classified elsewhere, unspecified severity, with other behavioral disturbance: Secondary | ICD-10-CM | POA: Diagnosis present

## 2022-06-17 LAB — URINALYSIS, W/ REFLEX TO CULTURE (INFECTION SUSPECTED)
Bacteria, UA: NONE SEEN
Bilirubin Urine: NEGATIVE
Glucose, UA: NEGATIVE mg/dL
Ketones, ur: NEGATIVE mg/dL
Nitrite: NEGATIVE
Protein, ur: 100 mg/dL — AB
RBC / HPF: >50 RBC/hpf (ref 0–5)
Specific Gravity, Urine: 1.015 (ref 1.005–1.030)
WBC, UA: >50 WBC/hpf (ref 0–5)
pH: 5 (ref 5.0–8.0)

## 2022-06-17 LAB — CBC WITH DIFFERENTIAL/PLATELET
Abs Immature Granulocytes: 0.07 10*3/uL (ref 0.00–0.07)
Basophils Absolute: 0 10*3/uL (ref 0.0–0.1)
Basophils Relative: 0 %
Eosinophils Absolute: 0.2 10*3/uL (ref 0.0–0.5)
Eosinophils Relative: 2 %
HCT: 33.2 % — ABNORMAL LOW (ref 39.0–52.0)
Hemoglobin: 10.4 g/dL — ABNORMAL LOW (ref 13.0–17.0)
Immature Granulocytes: 1 %
Lymphocytes Relative: 13 %
Lymphs Abs: 1.3 10*3/uL (ref 0.7–4.0)
MCH: 27.2 pg (ref 26.0–34.0)
MCHC: 31.3 g/dL (ref 30.0–36.0)
MCV: 86.9 fL (ref 80.0–100.0)
Monocytes Absolute: 0.4 10*3/uL (ref 0.1–1.0)
Monocytes Relative: 4 %
Neutro Abs: 8 10*3/uL — ABNORMAL HIGH (ref 1.7–7.7)
Neutrophils Relative %: 80 %
Platelets: 411 10*3/uL — ABNORMAL HIGH (ref 150–400)
RBC: 3.82 MIL/uL — ABNORMAL LOW (ref 4.22–5.81)
RDW: 14.6 % (ref 11.5–15.5)
WBC: 9.9 10*3/uL (ref 4.0–10.5)
nRBC: 0 % (ref 0.0–0.2)

## 2022-06-17 LAB — COMPREHENSIVE METABOLIC PANEL
ALT: 8 U/L (ref 0–44)
AST: 12 U/L — ABNORMAL LOW (ref 15–41)
Albumin: 2.6 g/dL — ABNORMAL LOW (ref 3.5–5.0)
Alkaline Phosphatase: 86 U/L (ref 38–126)
Anion gap: 10 (ref 5–15)
BUN: 10 mg/dL (ref 8–23)
CO2: 26 mmol/L (ref 22–32)
Calcium: 8.2 mg/dL — ABNORMAL LOW (ref 8.9–10.3)
Chloride: 103 mmol/L (ref 98–111)
Creatinine, Ser: 0.58 mg/dL — ABNORMAL LOW (ref 0.61–1.24)
GFR, Estimated: >60 mL/min (ref >60)
Glucose, Bld: 165 mg/dL — ABNORMAL HIGH (ref 70–99)
Potassium: 3.9 mmol/L (ref 3.5–5.1)
Sodium: 139 mmol/L (ref 135–145)
Total Bilirubin: 0.5 mg/dL (ref 0.3–1.2)
Total Protein: 6.1 g/dL — ABNORMAL LOW (ref 6.5–8.1)

## 2022-06-17 LAB — GLUCOSE, CAPILLARY
Glucose-Capillary: 192 mg/dL — ABNORMAL HIGH (ref 70–99)
Glucose-Capillary: 206 mg/dL — ABNORMAL HIGH (ref 70–99)

## 2022-06-17 LAB — TROPONIN I (HIGH SENSITIVITY)
Troponin I (High Sensitivity): 3 ng/L (ref <18)
Troponin I (High Sensitivity): 3 ng/L (ref <18)

## 2022-06-17 LAB — RESP PANEL BY RT-PCR (RSV, FLU A&B, COVID)  RVPGX2
Influenza A by PCR: NEGATIVE
Influenza B by PCR: NEGATIVE
Resp Syncytial Virus by PCR: NEGATIVE
SARS Coronavirus 2 by RT PCR: NEGATIVE

## 2022-06-17 LAB — LACTIC ACID, PLASMA: Lactic Acid, Venous: 1.3 mmol/L (ref 0.5–1.9)

## 2022-06-17 LAB — MAGNESIUM: Magnesium: 1.4 mg/dL — ABNORMAL LOW (ref 1.7–2.4)

## 2022-06-17 LAB — PHOSPHORUS: Phosphorus: 3 mg/dL (ref 2.5–4.6)

## 2022-06-17 MED ORDER — IPRATROPIUM BROMIDE 0.02 % IN SOLN: 0.5000 mg | Freq: Four times a day (QID) | RESPIRATORY_TRACT | Status: DC | PRN

## 2022-06-17 MED ORDER — INSULIN ASPART 100 UNIT/ML IJ SOLN
0.0000 [IU] | Freq: Three times a day (TID) | INTRAMUSCULAR | Status: DC
  Administered 2022-06-17 – 2022-06-18 (×2): 1 [IU] via SUBCUTANEOUS
  Administered 2022-06-18: 2 [IU] via SUBCUTANEOUS
  Administered 2022-06-19 (×2): 1 [IU] via SUBCUTANEOUS

## 2022-06-17 MED ORDER — ATORVASTATIN CALCIUM 40 MG PO TABS
40.0000 mg | ORAL_TABLET | Freq: Every day | ORAL | Status: DC
  Administered 2022-06-17 – 2022-06-18 (×2): 40 mg via ORAL
  Filled 2022-06-17 (×2): qty 1

## 2022-06-17 MED ORDER — HEPARIN BOLUS VIA INFUSION: 4000.0000 [IU] | Freq: Once | INTRAVENOUS | Status: DC

## 2022-06-17 MED ORDER — ATORVASTATIN CALCIUM 40 MG PO TABS: 80.0000 mg | ORAL_TABLET | Freq: Every day | ORAL | Status: DC

## 2022-06-17 MED ORDER — SODIUM CHLORIDE 0.9% FLUSH
3.0000 mL | Freq: Two times a day (BID) | INTRAVENOUS | Status: DC
  Administered 2022-06-17 – 2022-06-19 (×4): 3 mL via INTRAVENOUS

## 2022-06-17 MED ORDER — VITAMIN C 500 MG PO TABS
500.0000 mg | ORAL_TABLET | Freq: Every day | ORAL | Status: DC
  Administered 2022-06-17 – 2022-06-19 (×3): 500 mg via ORAL
  Filled 2022-06-17 (×3): qty 1

## 2022-06-17 MED ORDER — OXYCODONE-ACETAMINOPHEN 5-325 MG PO TABS
1.0000 | ORAL_TABLET | Freq: Once | ORAL | Status: AC
  Administered 2022-06-17: 1 via ORAL
  Filled 2022-06-17: qty 1

## 2022-06-17 MED ORDER — MAGNESIUM OXIDE -MG SUPPLEMENT 400 (240 MG) MG PO TABS
800.0000 mg | ORAL_TABLET | Freq: Every day | ORAL | Status: DC
  Administered 2022-06-17 – 2022-06-19 (×3): 800 mg via ORAL
  Filled 2022-06-17 (×3): qty 2

## 2022-06-17 MED ORDER — FLEET ENEMA 7-19 GM/118ML RE ENEM: 1.0000 | ENEMA | Freq: Once | RECTAL | Status: DC | PRN

## 2022-06-17 MED ORDER — BACLOFEN 10 MG PO TABS: 10.0000 mg | ORAL_TABLET | Freq: Two times a day (BID) | ORAL | Status: DC | PRN

## 2022-06-17 MED ORDER — ONDANSETRON HCL 4 MG PO TABS: 4.0000 mg | ORAL_TABLET | Freq: Four times a day (QID) | ORAL | Status: DC | PRN

## 2022-06-17 MED ORDER — TRAZODONE HCL 50 MG PO TABS
25.0000 mg | ORAL_TABLET | Freq: Every evening | ORAL | Status: DC | PRN
  Administered 2022-06-17: 25 mg via ORAL
  Filled 2022-06-17: qty 1

## 2022-06-17 MED ORDER — GALANTAMINE HYDROBROMIDE ER 8 MG PO CP24
24.0000 mg | ORAL_CAPSULE | Freq: Every evening | ORAL | Status: DC
  Administered 2022-06-18: 24 mg via ORAL
  Filled 2022-06-17 (×4): qty 3

## 2022-06-17 MED ORDER — HYDRALAZINE HCL 20 MG/ML IJ SOLN: 10.0000 mg | INTRAMUSCULAR | Status: DC | PRN

## 2022-06-17 MED ORDER — HEPARIN (PORCINE) 25000 UT/250ML-% IV SOLN: 1200.0000 [IU]/h | INTRAVENOUS | Status: DC

## 2022-06-17 MED ORDER — APIXABAN 5 MG PO TABS: 5.0000 mg | ORAL_TABLET | Freq: Two times a day (BID) | ORAL | Status: DC

## 2022-06-17 MED ORDER — MAGNESIUM SULFATE 2 GM/50ML IV SOLN
2.0000 g | Freq: Once | INTRAVENOUS | Status: AC
  Administered 2022-06-17: 2 g via INTRAVENOUS
  Filled 2022-06-17: qty 50

## 2022-06-17 MED ORDER — ACETAMINOPHEN 650 MG RE SUPP: 650.0000 mg | Freq: Four times a day (QID) | RECTAL | Status: DC | PRN

## 2022-06-17 MED ORDER — SERTRALINE HCL 50 MG PO TABS
100.0000 mg | ORAL_TABLET | Freq: Every morning | ORAL | Status: DC
  Administered 2022-06-18 – 2022-06-19 (×2): 100 mg via ORAL
  Filled 2022-06-17 (×2): qty 2

## 2022-06-17 MED ORDER — MELATONIN 3 MG PO TABS
3.0000 mg | ORAL_TABLET | Freq: Every day | ORAL | Status: DC
  Administered 2022-06-17 – 2022-06-18 (×2): 3 mg via ORAL
  Filled 2022-06-17 (×2): qty 1

## 2022-06-17 MED ORDER — SODIUM CHLORIDE 0.9% FLUSH: 3.0000 mL | Freq: Two times a day (BID) | INTRAVENOUS | Status: DC

## 2022-06-17 MED ORDER — METHOCARBAMOL 500 MG PO TABS: 500.0000 mg | ORAL_TABLET | Freq: Three times a day (TID) | ORAL | Status: DC | PRN

## 2022-06-17 MED ORDER — HEPARIN SODIUM (PORCINE) 5000 UNIT/ML IJ SOLN: 5000.0000 [IU] | Freq: Three times a day (TID) | INTRAMUSCULAR | Status: DC

## 2022-06-17 MED ORDER — MOMETASONE FURO-FORMOTEROL FUM 100-5 MCG/ACT IN AERO
2.0000 | INHALATION_SPRAY | Freq: Two times a day (BID) | RESPIRATORY_TRACT | Status: DC
  Administered 2022-06-17 – 2022-06-19 (×4): 2 via RESPIRATORY_TRACT
  Filled 2022-06-17: qty 8.8

## 2022-06-17 MED ORDER — SODIUM CHLORIDE 0.9% FLUSH: 3.0000 mL | INTRAVENOUS | Status: DC | PRN

## 2022-06-17 MED ORDER — GABAPENTIN 300 MG PO CAPS
600.0000 mg | ORAL_CAPSULE | Freq: Two times a day (BID) | ORAL | Status: DC
  Administered 2022-06-18 – 2022-06-19 (×4): 600 mg via ORAL
  Filled 2022-06-17 (×4): qty 2

## 2022-06-17 MED ORDER — TIOTROPIUM BROMIDE MONOHYDRATE 18 MCG IN CAPS: 18.0000 ug | ORAL_CAPSULE | Freq: Every morning | RESPIRATORY_TRACT | Status: DC

## 2022-06-17 MED ORDER — AMLODIPINE BESYLATE 5 MG PO TABS
5.0000 mg | ORAL_TABLET | Freq: Every day | ORAL | Status: DC
  Administered 2022-06-17: 5 mg via ORAL
  Filled 2022-06-17 (×2): qty 1

## 2022-06-17 MED ORDER — FERROUS SULFATE 325 (65 FE) MG PO TABS
325.0000 mg | ORAL_TABLET | Freq: Every morning | ORAL | Status: DC
  Administered 2022-06-18 – 2022-06-19 (×2): 325 mg via ORAL
  Filled 2022-06-17 (×2): qty 1

## 2022-06-17 MED ORDER — FUROSEMIDE 20 MG PO TABS
20.0000 mg | ORAL_TABLET | Freq: Every day | ORAL | Status: DC
  Filled 2022-06-17: qty 1

## 2022-06-17 MED ORDER — LACTATED RINGERS IV BOLUS (SEPSIS)
1000.0000 mL | Freq: Once | INTRAVENOUS | Status: AC
  Administered 2022-06-17: 1000 mL via INTRAVENOUS

## 2022-06-17 MED ORDER — LORATADINE 10 MG PO TABS
10.0000 mg | ORAL_TABLET | Freq: Every day | ORAL | Status: DC
  Administered 2022-06-17 – 2022-06-19 (×3): 10 mg via ORAL
  Filled 2022-06-17 (×3): qty 1

## 2022-06-17 MED ORDER — CARBIDOPA-LEVODOPA ER 50-200 MG PO TBCR
1.0000 | EXTENDED_RELEASE_TABLET | Freq: Every day | ORAL | Status: DC
  Administered 2022-06-17 – 2022-06-18 (×2): 1 via ORAL
  Filled 2022-06-17 (×4): qty 1

## 2022-06-17 MED ORDER — LISINOPRIL 5 MG PO TABS
5.0000 mg | ORAL_TABLET | Freq: Every day | ORAL | Status: DC
  Administered 2022-06-17: 5 mg via ORAL
  Filled 2022-06-17 (×2): qty 1

## 2022-06-17 MED ORDER — UMECLIDINIUM BROMIDE 62.5 MCG/ACT IN AEPB
1.0000 | INHALATION_SPRAY | Freq: Every day | RESPIRATORY_TRACT | Status: DC
  Administered 2022-06-18 – 2022-06-19 (×2): 1 via RESPIRATORY_TRACT
  Filled 2022-06-17: qty 7

## 2022-06-17 MED ORDER — BISACODYL 5 MG PO TBEC: 5.0000 mg | DELAYED_RELEASE_TABLET | Freq: Every day | ORAL | Status: DC | PRN

## 2022-06-17 MED ORDER — MAGNESIUM OXIDE -MG SUPPLEMENT 400 (240 MG) MG PO TABS
400.0000 mg | ORAL_TABLET | Freq: Every day | ORAL | Status: DC
  Administered 2022-06-17 – 2022-06-18 (×2): 400 mg via ORAL
  Filled 2022-06-17 (×2): qty 1

## 2022-06-17 MED ORDER — TAMSULOSIN HCL 0.4 MG PO CAPS
0.4000 mg | ORAL_CAPSULE | Freq: Every day | ORAL | Status: DC
  Administered 2022-06-17 – 2022-06-18 (×2): 0.4 mg via ORAL
  Filled 2022-06-17 (×2): qty 1

## 2022-06-17 MED ORDER — METHOCARBAMOL 500 MG PO TABS: 500.0000 mg | ORAL_TABLET | Freq: Four times a day (QID) | ORAL | Status: DC

## 2022-06-17 MED ORDER — LEVALBUTEROL HCL 0.63 MG/3ML IN NEBU: 0.6300 mg | INHALATION_SOLUTION | Freq: Four times a day (QID) | RESPIRATORY_TRACT | Status: DC | PRN

## 2022-06-17 MED ORDER — CARBIDOPA-LEVODOPA 25-100 MG PO TABS
2.0000 | ORAL_TABLET | Freq: Three times a day (TID) | ORAL | Status: DC
  Administered 2022-06-17 – 2022-06-19 (×6): 2 via ORAL
  Filled 2022-06-17 (×12): qty 2

## 2022-06-17 MED ORDER — METOPROLOL TARTRATE 25 MG PO TABS
25.0000 mg | ORAL_TABLET | Freq: Two times a day (BID) | ORAL | Status: DC
  Administered 2022-06-17 – 2022-06-19 (×4): 25 mg via ORAL
  Filled 2022-06-17 (×5): qty 1

## 2022-06-17 MED ORDER — DOXYCYCLINE HYCLATE 100 MG PO TABS
100.0000 mg | ORAL_TABLET | Freq: Two times a day (BID) | ORAL | Status: DC
  Administered 2022-06-17 – 2022-06-18 (×3): 100 mg via ORAL
  Filled 2022-06-17 (×3): qty 1

## 2022-06-17 MED ORDER — ACETAMINOPHEN 325 MG PO TABS
650.0000 mg | ORAL_TABLET | Freq: Four times a day (QID) | ORAL | Status: DC | PRN
  Administered 2022-06-17: 650 mg via ORAL
  Filled 2022-06-17: qty 2

## 2022-06-17 MED ORDER — HYDROMORPHONE HCL 1 MG/ML IJ SOLN
0.5000 mg | INTRAMUSCULAR | Status: DC | PRN
  Administered 2022-06-18: 1 mg via INTRAVENOUS
  Filled 2022-06-17: qty 1

## 2022-06-17 MED ORDER — SENNOSIDES-DOCUSATE SODIUM 8.6-50 MG PO TABS: 1.0000 | ORAL_TABLET | Freq: Every evening | ORAL | Status: DC | PRN

## 2022-06-17 MED ORDER — OXYCODONE HCL 5 MG PO TABS
5.0000 mg | ORAL_TABLET | ORAL | Status: DC | PRN
  Administered 2022-06-18: 5 mg via ORAL
  Filled 2022-06-17 (×2): qty 1

## 2022-06-17 MED ORDER — SODIUM CHLORIDE 0.9 % IV SOLN
2.0000 g | Freq: Three times a day (TID) | INTRAVENOUS | Status: AC
  Administered 2022-06-17 – 2022-06-18 (×3): 2 g via INTRAVENOUS
  Filled 2022-06-17 (×3): qty 12.5

## 2022-06-17 MED ORDER — VITAMIN D 25 MCG (1000 UNIT) PO TABS
1000.0000 [IU] | ORAL_TABLET | Freq: Every day | ORAL | Status: DC
  Administered 2022-06-17 – 2022-06-19 (×3): 1000 [IU] via ORAL
  Filled 2022-06-17 (×3): qty 1

## 2022-06-17 MED ORDER — FUROSEMIDE 40 MG PO TABS: 20.0000 mg | ORAL_TABLET | Freq: Every day | ORAL | Status: DC

## 2022-06-17 MED ORDER — IOHEXOL 350 MG/ML SOLN
100.0000 mL | Freq: Once | INTRAVENOUS | Status: AC | PRN
  Administered 2022-06-17: 100 mL via INTRAVENOUS

## 2022-06-17 MED ORDER — SODIUM CHLORIDE 0.9 % IV SOLN: 250.0000 mL | INTRAVENOUS | Status: DC | PRN

## 2022-06-17 MED ORDER — OXYCODONE-ACETAMINOPHEN 5-325 MG PO TABS
2.0000 | ORAL_TABLET | Freq: Once | ORAL | Status: AC
  Administered 2022-06-17: 2 via ORAL
  Filled 2022-06-17: qty 2

## 2022-06-17 MED ORDER — OXYBUTYNIN CHLORIDE 5 MG PO TABS
5.0000 mg | ORAL_TABLET | Freq: Every day | ORAL | Status: DC
  Administered 2022-06-17: 5 mg via ORAL
  Filled 2022-06-17: qty 1

## 2022-06-17 MED ORDER — INSULIN DETEMIR 100 UNIT/ML ~~LOC~~ SOLN
12.0000 [IU] | Freq: Every morning | SUBCUTANEOUS | Status: DC
  Administered 2022-06-18 – 2022-06-19 (×2): 12 [IU] via SUBCUTANEOUS
  Filled 2022-06-17 (×5): qty 0.12

## 2022-06-17 MED ORDER — SODIUM CHLORIDE 0.9 % IV SOLN: INTRAVENOUS | Status: DC

## 2022-06-17 MED ORDER — SODIUM CHLORIDE 0.9 % IV SOLN
2.0000 g | Freq: Once | INTRAVENOUS | Status: AC
  Administered 2022-06-17: 2 g via INTRAVENOUS
  Filled 2022-06-17: qty 12.5

## 2022-06-17 MED ORDER — MEMANTINE HCL 10 MG PO TABS
10.0000 mg | ORAL_TABLET | Freq: Two times a day (BID) | ORAL | Status: DC
  Administered 2022-06-17 – 2022-06-19 (×5): 10 mg via ORAL
  Filled 2022-06-17 (×5): qty 1

## 2022-06-17 MED ORDER — GABAPENTIN 300 MG PO CAPS
900.0000 mg | ORAL_CAPSULE | Freq: Every day | ORAL | Status: DC
  Administered 2022-06-17 – 2022-06-18 (×2): 900 mg via ORAL
  Filled 2022-06-17 (×2): qty 3

## 2022-06-17 MED ORDER — APIXABAN 5 MG PO TABS
10.0000 mg | ORAL_TABLET | Freq: Two times a day (BID) | ORAL | Status: DC
  Administered 2022-06-17 – 2022-06-19 (×4): 10 mg via ORAL
  Filled 2022-06-17 (×4): qty 2

## 2022-06-17 MED ORDER — ONDANSETRON HCL 4 MG/2ML IJ SOLN
4.0000 mg | Freq: Four times a day (QID) | INTRAMUSCULAR | Status: DC | PRN
  Administered 2022-06-18: 4 mg via INTRAVENOUS
  Filled 2022-06-17: qty 2

## 2022-06-17 NOTE — Assessment & Plan Note (Signed)
-   Continue home dose aspirin, statins,

## 2022-06-17 NOTE — Progress Notes (Addendum)
ANTICOAGULATION CONSULT NOTE - Initial Consult  Pharmacy Consult for apixaban Indication: pulmonary embolus  Allergies  Allergen Reactions   Barbiturates Other (See Comments)    Blacks out Blacks out    Seroquel  [Quetiapine]     Other reaction(s): Hallucination   Shellfish Allergy Hives    Patient Measurements: Weight: 73 kg (160 lb 15 oz) Heparin Dosing Weight:   Vital Signs: Temp: 97.7 F (36.5 C) (03/25 1335) Temp Source: Oral (03/25 1335) BP: 135/71 (03/25 1330) Pulse Rate: 65 (03/25 1330)  Labs: Recent Labs    06/17/22 0901 06/17/22 1115  HGB 10.4*  --   HCT 33.2*  --   PLT 411*  --   CREATININE 0.58*  --   TROPONINIHS 3 3    CrCl cannot be calculated (Unknown ideal weight.).   Medical History: Past Medical History:  Diagnosis Date   COPD (chronic obstructive pulmonary disease) ([place])    Diabetes mellitus without complication ([place])    "type 2"   Hypertension    Stroke [facility])     Medications:  (Not in a hospital admission)   Assessment: Pharmacy consulted to dose heparin in patient with pulmonary embolism confirmed by CT.  Patient is not on anticoagulation prior to admission.  Hgb 10.4  Goal of Therapy:  Heparin level 0.3-0.7 units/ml Monitor platelets by anticoagulation protocol: Yes   Plan:  Start apixaban 10 mg twice daily x 7 days followed by apixaban 5 mg twice daily.  Margot Ables, PharmD Clinical Pharmacist 06/17/2022 2:26 PM

## 2022-06-17 NOTE — Discharge Instructions (Signed)
Information on my medicine - ELIQUIS® (apixaban) ° °This medication education was reviewed with me or my healthcare representative as part of my discharge preparation.   ° °Why was Eliquis® prescribed for you? °Eliquis® was prescribed to treat blood clots that may have been found in the veins of your legs (deep vein thrombosis) or in your lungs (pulmonary embolism) and to reduce the risk of them occurring again. ° °What do You need to know about Eliquis® ? °The starting dose is 10 mg (two 5 mg tablets) taken TWICE daily for the FIRST SEVEN (7) DAYS, then on (enter date)  ***  the dose is reduced to ONE 5 mg tablet taken TWICE daily.  Eliquis® may be taken with or without food.  ° °Try to take the dose about the same time in the morning and in the evening. If you have difficulty swallowing the tablet whole please discuss with your pharmacist how to take the medication safely. ° °Take Eliquis® exactly as prescribed and DO NOT stop taking Eliquis® without talking to the doctor who prescribed the medication.  Stopping may increase your risk of developing a new blood clot.  Refill your prescription before you run out. ° °After discharge, you should have regular check-up appointments with your healthcare provider that is prescribing your Eliquis®. °   °What do you do if you miss a dose? °If a dose of ELIQUIS® is not taken at the scheduled time, take it as soon as possible on the same day and twice-daily administration should be resumed. The dose should not be doubled to make up for a missed dose. ° °Important Safety Information °A possible side effect of Eliquis® is bleeding. You should call your healthcare provider right away if you experience any of the following: °? Bleeding from an injury or your nose that does not stop. °? Unusual colored urine (red or dark brown) or unusual colored stools (red or black). °? Unusual bruising for unknown reasons. °? A serious fall or if you hit your head (even if there is no  bleeding). ° °Some medicines may interact with Eliquis® and might increase your risk of bleeding or clotting while on Eliquis®. To help avoid this, consult your healthcare provider or pharmacist prior to using any new prescription or non-prescription medications, including herbals, vitamins, non-steroidal anti-inflammatory drugs (NSAIDs) and supplements. ° °This website has more information on Eliquis® (apixaban): http://www.eliquis.com/eliquis/home ° ° °

## 2022-06-17 NOTE — Assessment & Plan Note (Signed)
-  Ruling out SIRS, sepsis -Was treated for pneumonia and UTI at [facility] Current vitals Blood pressure (!) 107/48, pulse 62, temperature 98 F (36.7 C), resp. rate 18, weight 70.5 kg, SpO2 92 %.   CBC WBC 9.9,  lactic acid 1.3,  CTA: Abdomen pelvis/chest And pelvis organs within normal limits, Chest small PE peripheral left lower lobe branch artery Multiple ill-defined patchy opacities are noted in both lungs suggesting multifocal pneumonia.  Small left pleural effusion subsegmental atelectasis.  Extensive CAD   Status post IV fluid hydration, discontinuing IV fluids, encourage oral hydration  On  Broad-spectrum antibiotics of Cefepime and doxycycline  Blood and urine cultures >>> reporting no growth If cultures remain negative, will discontinue antibiotics

## 2022-06-17 NOTE — ED Notes (Signed)
ED TO INPATIENT HANDOFF REPORT  ED Nurse Name and Phone #: Carroll Kinds  S Name/Age/Gender Aaron Leon 72 y.o. male Room/Bed: APA18/APA18  Code Status   Code Status: Full Code  Home/SNF/Other Home Patient oriented to: self, place, time, and situation Is this baseline? Yes   Triage Complete: Triage complete  Chief Complaint Pulmonary embolism ([place]) [I26.99]  Triage Note Pt BIB [facility] c/o genital  pain, pt states "it feels like someone is sitting on my testicles". Pt thinks its time for his catheter to be changed, he believes it has been in for 16+ weeks.    Allergies Allergies  Allergen Reactions   Barbiturates Other (See Comments)    Blacks out Blacks out    Seroquel  [Quetiapine]     Other reaction(s): Hallucination   Shellfish Allergy Hives    Level of Care/Admitting Diagnosis ED Disposition     ED Disposition  Admit   Condition  --   [place]: [facility] [id number]  Level of Care: Telemetry [5]  Covid Evaluation: Asymptomatic - no recent exposure (last 10 days) testing not required  Diagnosis: Pulmonary embolism ([place]) [id number]  Admitting Physician: Acquanetta Sit  Attending Physician: Acquanetta Sit          B Medical/Surgery History Past Medical History:  Diagnosis Date   COPD (chronic obstructive pulmonary disease) ([place])    Diabetes mellitus without complication ([place])    "type 2"   Hypertension    Stroke [facility])    Past Surgical History:  Procedure Laterality Date   BACK SURGERY     CATARACT EXTRACTION     CERVICAL FUSION     KNEE SURGERY       A IV Location/Drains/Wounds Patient Lines/Drains/Airways Status     Active Line/Drains/Airways     Name Placement date Placement time Site Days   Peripheral IV 06/17/22 20 G Right;Lateral Forearm 06/17/22  0858  Forearm  less than 1   Urethral Catheter Tori Parrish Latex 16 Fr. 06/17/22  0929  Latex  less than 1             Intake/Output Last 24 hours No intake or output data in the 24 hours ending 06/17/22 1504  Labs/Imaging Results for orders placed or performed during the hospital encounter of 06/17/22 (from the past 48 hour(s))  Resp panel by RT-PCR (RSV, Flu A&B, Covid) Anterior Nasal Swab     Status: None   Collection Time: 06/17/22  8:57 AM   Specimen: Anterior Nasal Swab  Result Value Ref Range   SARS Coronavirus 2 by RT PCR NEGATIVE NEGATIVE    Comment: (NOTE) SARS-CoV-2 target nucleic acids are NOT DETECTED.  The SARS-CoV-2 RNA is generally detectable in upper respiratory specimens during the acute phase of infection. The lowest concentration of SARS-CoV-2 viral copies this assay can detect is 138 copies/mL. A negative result does not preclude SARS-Cov-2 infection and should not be used as the sole basis for treatment or other patient management decisions. A negative result may occur with  improper specimen collection/handling, submission of specimen other than nasopharyngeal swab, presence of viral mutation(s) within the areas targeted by this assay, and inadequate number of viral copies(<138 copies/mL). A negative result must be combined with clinical observations, patient history, and epidemiological information. The expected result is Negative.  Fact Sheet for Patients:  EntrepreneurPulse.com.au  Fact Sheet for Healthcare Providers:  IncredibleEmployment.be  This test is no t yet approved or cleared by the [place]  States FDA and  has been authorized for detection and/or diagnosis of SARS-CoV-2 by FDA under an Emergency Use Authorization (EUA). This EUA will remain  in effect (meaning this test can be used) for the duration of the COVID-19 declaration under Section 564(b)(1) of the Act, 21 U.S.C.section 360bbb-3(b)(1), unless the authorization is terminated  or revoked sooner.       Influenza A by PCR NEGATIVE NEGATIVE   Influenza B by PCR  NEGATIVE NEGATIVE    Comment: (NOTE) The Xpert Xpress SARS-CoV-2/FLU/RSV plus assay is intended as an aid in the diagnosis of influenza from Nasopharyngeal swab specimens and should not be used as a sole basis for treatment. Nasal washings and aspirates are unacceptable for Xpert Xpress SARS-CoV-2/FLU/RSV testing.  Fact Sheet for Patients: EntrepreneurPulse.com.au  Fact Sheet for Healthcare Providers: IncredibleEmployment.be  This test is not yet approved or cleared by the [place] FDA and has been authorized for detection and/or diagnosis of SARS-CoV-2 by FDA under an Emergency Use Authorization (EUA). This EUA will remain in effect (meaning this test can be used) for the duration of the COVID-19 declaration under Section 564(b)(1) of the Act, 21 U.S.C. section 360bbb-3(b)(1), unless the authorization is terminated or revoked.     Resp Syncytial Virus by PCR NEGATIVE NEGATIVE    Comment: (NOTE) Fact Sheet for Patients: EntrepreneurPulse.com.au  Fact Sheet for Healthcare Providers: IncredibleEmployment.be  This test is not yet approved or cleared by the [place] FDA and has been authorized for detection and/or diagnosis of SARS-CoV-2 by FDA under an Emergency Use Authorization (EUA). This EUA will remain in effect (meaning this test can be used) for the duration of the COVID-19 declaration under Section 564(b)(1) of the Act, 21 U.S.C. section 360bbb-3(b)(1), unless the authorization is terminated or revoked.  Performed at [facility], [address], [place], [place] [address]   Lactic acid, plasma     Status: None   Collection Time: 06/17/22  9:01 AM  Result Value Ref Range   Lactic Acid, Venous 1.3 0.5 - 1.9 mmol/L    Comment: Performed at [facility], [address] [place], [place] [address]  Comprehensive metabolic panel     Status: Abnormal   Collection Time: 06/17/22  9:01 AM   Result Value Ref Range   Sodium 139 135 - 145 mmol/L   Potassium 3.9 3.5 - 5.1 mmol/L   Chloride 103 98 - 111 mmol/L   CO2 26 22 - 32 mmol/L   Glucose, Bld 165 (H) 70 - 99 mg/dL    Comment: Glucose reference range applies only to samples taken after fasting for at least 8 hours.   BUN 10 8 - 23 mg/dL   Creatinine, Ser 0.58 (L) 0.61 - 1.24 mg/dL   Calcium 8.2 (L) 8.9 - 10.3 mg/dL   Total Protein 6.1 (L) 6.5 - 8.1 g/dL   Albumin 2.6 (L) 3.5 - 5.0 g/dL   AST 12 (L) 15 - 41 U/L   ALT 8 0 - 44 U/L   Alkaline Phosphatase 86 38 - 126 U/L   Total Bilirubin 0.5 0.3 - 1.2 mg/dL   GFR, Estimated >60 >60 mL/min    Comment: (NOTE) Calculated using the CKD-EPI Creatinine Equation (2021)    Anion gap 10 5 - 15    Comment: Performed at [facility], [address], [place], [place] [address]  CBC with Differential     Status: Abnormal   Collection Time: 06/17/22  9:01 AM  Result Value Ref Range   WBC  9.9 4.0 - 10.5 K/uL   RBC 3.82 (L) 4.22 - 5.81 MIL/uL   Hemoglobin 10.4 (L) 13.0 - 17.0 g/dL   HCT 33.2 (L) 39.0 - 52.0 %   MCV 86.9 80.0 - 100.0 fL   MCH 27.2 26.0 - 34.0 pg   MCHC 31.3 30.0 - 36.0 g/dL   RDW 14.6 11.5 - 15.5 %   Platelets 411 (H) 150 - 400 K/uL   nRBC 0.0 0.0 - 0.2 %   Neutrophils Relative % 80 %   Neutro Abs 8.0 (H) 1.7 - 7.7 K/uL   Lymphocytes Relative 13 %   Lymphs Abs 1.3 0.7 - 4.0 K/uL   Monocytes Relative 4 %   Monocytes Absolute 0.4 0.1 - 1.0 K/uL   Eosinophils Relative 2 %   Eosinophils Absolute 0.2 0.0 - 0.5 K/uL   Basophils Relative 0 %   Basophils Absolute 0.0 0.0 - 0.1 K/uL   Immature Granulocytes 1 %   Abs Immature Granulocytes 0.07 0.00 - 0.07 K/uL    Comment: Performed at [facility], [address], [place], [place] [address]  Blood Culture (routine x 2)     Status: None (Preliminary result)   Collection Time: 06/17/22  9:01 AM   Specimen: BLOOD LEFT HAND  Result Value Ref Range   Specimen Description BLOOD LEFT HAND    Special Requests       BOTTLES DRAWN AEROBIC AND ANAEROBIC Blood Culture adequate volume Performed at [facility], [address] [place], [place] [address]    Culture PENDING    Report Status PENDING   Troponin I (High Sensitivity)     Status: None   Collection Time: 06/17/22  9:01 AM  Result Value Ref Range   Troponin I (High Sensitivity) 3 <18 ng/L    Comment: (NOTE) Elevated high sensitivity troponin I (hsTnI) values and significant  changes across serial measurements may suggest ACS but many other  chronic and acute conditions are known to elevate hsTnI results.  Refer to the "Links" section for chest pain algorithms and additional  guidance. Performed at [facility], [address], [place], [place] [address]   Magnesium     Status: Abnormal   Collection Time: 06/17/22  9:01 AM  Result Value Ref Range   Magnesium 1.4 (L) 1.7 - 2.4 mg/dL    Comment: Performed at [facility], [address] [place], [place] [address]  Phosphorus     Status: None   Collection Time: 06/17/22  9:01 AM  Result Value Ref Range   Phosphorus 3.0 2.5 - 4.6 mg/dL    Comment: Performed at [facility], [address] [place], [place] [address]  Urinalysis, w/ Reflex to Culture (Infection Suspected) -Urine, Catheterized     Status: Abnormal   Collection Time: 06/17/22  9:15 AM  Result Value Ref Range   Specimen Source URINE, CATHETERIZED    Color, Urine YELLOW YELLOW   APPearance CLOUDY (A) CLEAR   Specific Gravity, Urine 1.015 1.005 - 1.030   pH 5.0 5.0 - 8.0   Glucose, UA NEGATIVE NEGATIVE mg/dL   Hgb urine dipstick LARGE (A) NEGATIVE   Bilirubin Urine NEGATIVE NEGATIVE   Ketones, ur NEGATIVE NEGATIVE mg/dL   Protein, ur 100 (A) NEGATIVE mg/dL   Nitrite NEGATIVE NEGATIVE   Leukocytes,Ua MODERATE (A) NEGATIVE   RBC / HPF >50 0 - 5 RBC/hpf   WBC, UA >50 0 - 5 WBC/hpf    Comment:        Reflex urine culture  not performed if WBC <=10, OR if Squamous epithelial cells >5. If Squamous epithelial cells >5 suggest  recollection.    Bacteria, UA NONE SEEN NONE SEEN   Squamous Epithelial / HPF 0-5 0 - 5 /HPF   WBC Clumps PRESENT    Mucus PRESENT     Comment: Performed at [facility], [address] [place], [place] [address]  Troponin I (High Sensitivity)     Status: None   Collection Time: 06/17/22 11:15 AM  Result Value Ref Range   Troponin I (High Sensitivity) 3 <18 ng/L    Comment: (NOTE) Elevated high sensitivity troponin I (hsTnI) values and significant  changes across serial measurements may suggest ACS but many other  chronic and acute conditions are known to elevate hsTnI results.  Refer to the "Links" section for chest pain algorithms and additional  guidance. Performed at [facility], [address] [place], [place] [address]    CT Angio Chest PE W and/or Wo Contrast  Result Date: 06/17/2022 CLINICAL DATA:  High probability of pulmonary embolus. Testicular pain. EXAM: CT ANGIOGRAPHY CHEST CT ABDOMEN AND PELVIS WITH CONTRAST TECHNIQUE: Multidetector CT imaging of the chest was performed using the standard protocol during bolus administration of intravenous contrast. Multiplanar CT image reconstructions and MIPs were obtained to evaluate the vascular anatomy. Multidetector CT imaging of the abdomen and pelvis was performed using the standard protocol during bolus administration of intravenous contrast. RADIATION DOSE REDUCTION: This exam was performed according to the departmental dose-optimization program which includes automated exposure control, adjustment of the mA and/or kV according to patient size and/or use of iterative reconstruction technique. CONTRAST:  170mL OMNIPAQUE IOHEXOL 350 MG/ML SOLN COMPARISON:  July 08, 2018. FINDINGS: CTA CHEST FINDINGS Cardiovascular: There is a very small filling defect seen in a peripheral lower lobe branch of the left pulmonary artery best seen on image number 152 of series 6. Normal cardiac size. No pericardial effusion. Extensive coronary artery  calcifications are noted. Mediastinum/Nodes: Small sliding-type hiatal hernia. No adenopathy. Thyroid gland is unremarkable. Lungs/Pleura: No pneumothorax is noted. Small left pleural effusion is noted with adjacent subsegmental atelectasis. Multiple patchy airspace opacities are noted throughout both lungs, most prominently seen in both upper lobes posteriorly, suggesting multifocal pneumonia. Musculoskeletal: Old right rib fractures are noted. No acute osseous abnormality is noted. Review of the MIP images confirms the above findings. CT ABDOMEN and PELVIS FINDINGS Hepatobiliary: No focal liver abnormality is seen. No gallstones, gallbladder wall thickening, or biliary dilatation. Pancreas: Unremarkable. No pancreatic ductal dilatation or surrounding inflammatory changes. Spleen: Normal in size without focal abnormality. Adrenals/Urinary Tract: Adrenal glands and kidneys appear normal. No hydronephrosis or renal obstruction is noted. Urinary bladder is decompressed secondary to Foley catheter. Stomach/Bowel: Stomach is within normal limits. Appendix appears normal. No evidence of bowel wall thickening, distention, or inflammatory changes. Vascular/Lymphatic: Aortic atherosclerosis. No enlarged abdominal or pelvic lymph nodes. Reproductive: Prostate gland is not well visualized due to scatter artifact arising from right hip prosthesis. Other: No abdominal wall hernia or abnormality. No abdominopelvic ascites. Musculoskeletal: Status post right total hip arthroplasty. Status post surgical internal fixation of old proximal left femoral fracture. Probable old T12 fracture is noted. No acute osseous abnormality is noted. Review of the MIP images confirms the above findings. IMPRESSION: Small pulmonary embolus seen in peripheral lower lobe branch of left pulmonary artery. Critical Value/emergent results were called by telephone at the time of interpretation on 06/17/2022 at 1:12 pm to provider Godfrey Pick , who verbally  acknowledged  these results. Multiple ill-defined patchy opacities are noted in both lungs suggesting multifocal pneumonia. Small left pleural effusion is noted with minimal adjacent subsegmental atelectasis. Extensive coronary artery calcifications are noted suggesting coronary disease. Small sliding-type hiatal hernia. No acute abnormality seen in the abdomen or pelvis. Aortic Atherosclerosis (ICD10-I70.0). Electronically Signed   By: Marijo Conception M.D.   On: 06/17/2022 13:12   CT ABDOMEN PELVIS W CONTRAST  Result Date: 06/17/2022 CLINICAL DATA:  High probability of pulmonary embolus. Testicular pain. EXAM: CT ANGIOGRAPHY CHEST CT ABDOMEN AND PELVIS WITH CONTRAST TECHNIQUE: Multidetector CT imaging of the chest was performed using the standard protocol during bolus administration of intravenous contrast. Multiplanar CT image reconstructions and MIPs were obtained to evaluate the vascular anatomy. Multidetector CT imaging of the abdomen and pelvis was performed using the standard protocol during bolus administration of intravenous contrast. RADIATION DOSE REDUCTION: This exam was performed according to the departmental dose-optimization program which includes automated exposure control, adjustment of the mA and/or kV according to patient size and/or use of iterative reconstruction technique. CONTRAST:  164mL OMNIPAQUE IOHEXOL 350 MG/ML SOLN COMPARISON:  July 08, 2018. FINDINGS: CTA CHEST FINDINGS Cardiovascular: There is a very small filling defect seen in a peripheral lower lobe branch of the left pulmonary artery best seen on image number 152 of series 6. Normal cardiac size. No pericardial effusion. Extensive coronary artery calcifications are noted. Mediastinum/Nodes: Small sliding-type hiatal hernia. No adenopathy. Thyroid gland is unremarkable. Lungs/Pleura: No pneumothorax is noted. Small left pleural effusion is noted with adjacent subsegmental atelectasis. Multiple patchy airspace opacities are noted  throughout both lungs, most prominently seen in both upper lobes posteriorly, suggesting multifocal pneumonia. Musculoskeletal: Old right rib fractures are noted. No acute osseous abnormality is noted. Review of the MIP images confirms the above findings. CT ABDOMEN and PELVIS FINDINGS Hepatobiliary: No focal liver abnormality is seen. No gallstones, gallbladder wall thickening, or biliary dilatation. Pancreas: Unremarkable. No pancreatic ductal dilatation or surrounding inflammatory changes. Spleen: Normal in size without focal abnormality. Adrenals/Urinary Tract: Adrenal glands and kidneys appear normal. No hydronephrosis or renal obstruction is noted. Urinary bladder is decompressed secondary to Foley catheter. Stomach/Bowel: Stomach is within normal limits. Appendix appears normal. No evidence of bowel wall thickening, distention, or inflammatory changes. Vascular/Lymphatic: Aortic atherosclerosis. No enlarged abdominal or pelvic lymph nodes. Reproductive: Prostate gland is not well visualized due to scatter artifact arising from right hip prosthesis. Other: No abdominal wall hernia or abnormality. No abdominopelvic ascites. Musculoskeletal: Status post right total hip arthroplasty. Status post surgical internal fixation of old proximal left femoral fracture. Probable old T12 fracture is noted. No acute osseous abnormality is noted. Review of the MIP images confirms the above findings. IMPRESSION: Small pulmonary embolus seen in peripheral lower lobe branch of left pulmonary artery. Critical Value/emergent results were called by telephone at the time of interpretation on 06/17/2022 at 1:12 pm to provider Godfrey Pick , who verbally acknowledged these results. Multiple ill-defined patchy opacities are noted in both lungs suggesting multifocal pneumonia. Small left pleural effusion is noted with minimal adjacent subsegmental atelectasis. Extensive coronary artery calcifications are noted suggesting coronary disease.  Small sliding-type hiatal hernia. No acute abnormality seen in the abdomen or pelvis. Aortic Atherosclerosis (ICD10-I70.0). Electronically Signed   By: Marijo Conception M.D.   On: 06/17/2022 13:12   DG Chest Port 1 View  Result Date: 06/17/2022 CLINICAL DATA:  Questionable sepsis - evaluate for abnormality EXAM: PORTABLE CHEST 1 VIEW COMPARISON:  Radiograph 04/20/2022, chest CT 07/08/2018  FINDINGS: Unchanged cardiomediastinal silhouette. Diffuse interstitial prominence with new reticular and faint airspace opacities in the left upper lung, airspace opacities in the left medial lung base, and airspace disease in the peripheral right mid lung. Thoracic spondylosis. There is a distal left clavicle fracture which appears subacute. Old right rib fractures noted. IMPRESSION: New bilateral airspace disease in the left upper lung, left medial lung base, and right peripheral mid lung concerning for multifocal pneumonia. Recommend radiographic follow-up. Distal left clavicle fracture which appears subacute. Electronically Signed   By: Maurine Simmering M.D.   On: 06/17/2022 10:17   US SCROTUM W/DOPPLER  Result Date: 06/17/2022 CLINICAL DATA:  Scrotal pain EXAM: SCROTAL ULTRASOUND DOPPLER ULTRASOUND OF THE TESTICLES TECHNIQUE: Complete ultrasound examination of the testicles, epididymis, and other scrotal structures was performed. Color and spectral Doppler ultrasound were also utilized to evaluate blood flow to the testicles. COMPARISON:  None Available. FINDINGS: Right testicle Measurements: 2.7 x 2.8 x 2.7 cm. No mass or microlithiasis visualized. Left testicle Measurements: 3.4 x 3 x 2.6 cm. There is 5 mm cyst in the left testis. No other focal abnormalities are seen. Right epididymis:  Normal in size and appearance. Left epididymis:  Normal in size and appearance. Hydrocele:  Small bilateral hydrocele is present. Varicocele:  None visualized. Pulsed Doppler interrogation of both testes demonstrates normal low resistance  arterial and venous waveforms bilaterally. IMPRESSION: There is no evidence of testicular torsion. There is 5 mm cyst in the left testis. No other focal abnormalities are seen in both testes. Small bilateral hydrocele. Electronically Signed   By: Elmer Picker M.D.   On: 06/17/2022 10:10    Pending Labs Unresulted Labs (From admission, onward)     Start     Ordered   06/18/22 0500  CBC  Daily,   R      06/17/22 1431   06/18/22 [id number]  Basic metabolic panel  Daily,   R      06/17/22 1442   06/18/22 0500  CBC  Daily,   R      06/17/22 1442   06/18/22 0500  Protime-INR  Tomorrow morning,   R        06/17/22 1442   06/18/22 0500  APTT  Tomorrow morning,   R        06/17/22 1442   06/17/22 1452  Hemoglobin A1c  Once,   R       Comments: To assess prior glycemic control    06/17/22 1452   06/17/22 1441  Expectorated Sputum Assessment w Gram Stain, Rflx to Resp Cult  Once,   R       Comments: If productive cough    06/17/22 1442   06/17/22 0915  Urine Culture  Once,   R        06/17/22 0915   06/17/22 0847  Blood Culture (routine x 2)  (Undifferentiated presentation (screening labs and basic nursing orders))  BLOOD CULTURE X 2,   STAT      06/17/22 0847            Vitals/Pain Today's Vitals   06/17/22 1335 06/17/22 1400 06/17/22 1420 06/17/22 1430  BP:  138/72  (!) 148/74  Pulse:  69  73  Resp:  16  (!) 26  Temp: 97.7 F (36.5 C)     TempSrc: Oral     SpO2:  93%  94%  Weight:      PainSc:   10-Worst pain ever     Isolation  Precautions No active isolations  Medications Medications  ceFEPIme (MAXIPIME) 2 g in sodium chloride 0.9 % 100 mL IVPB (has no administration in time range)  doxycycline (VIBRA-TABS) tablet 100 mg (100 mg Oral Given 06/17/22 1422)  sodium chloride flush (NS) 0.9 % injection 3 mL (has no administration in time range)  0.9 %  sodium chloride infusion (has no administration in time range)  acetaminophen (TYLENOL) tablet 650 mg (has no  administration in time range)    Or  acetaminophen (TYLENOL) suppository 650 mg (has no administration in time range)  oxyCODONE (Oxy IR/ROXICODONE) immediate release tablet 5 mg (has no administration in time range)  HYDROmorphone (DILAUDID) injection 0.5-1 mg (has no administration in time range)  traZODone (DESYREL) tablet 25 mg (has no administration in time range)  senna-docusate (Senokot-S) tablet 1 tablet (has no administration in time range)  bisacodyl (DULCOLAX) EC tablet 5 mg (has no administration in time range)  sodium phosphate (FLEET) 7-19 GM/118ML enema 1 enema (has no administration in time range)  ondansetron (ZOFRAN) tablet 4 mg (has no administration in time range)    Or  ondansetron (ZOFRAN) injection 4 mg (has no administration in time range)  ipratropium (ATROVENT) nebulizer solution 0.5 mg (has no administration in time range)  levalbuterol (XOPENEX) nebulizer solution 0.63 mg (has no administration in time range)  hydrALAZINE (APRESOLINE) injection 10 mg (has no administration in time range)  apixaban (ELIQUIS) tablet 10 mg (has no administration in time range)    Followed by  apixaban (ELIQUIS) tablet 5 mg (has no administration in time range)  amLODipine (NORVASC) tablet 5 mg (has no administration in time range)  lisinopril (ZESTRIL) tablet 5 mg (has no administration in time range)  metoprolol tartrate (LOPRESSOR) tablet 25 mg (has no administration in time range)  galantamine (RAZADYNE ER) 24 hr capsule 24 mg (has no administration in time range)  memantine (NAMENDA) tablet 10 mg (has no administration in time range)  sertraline (ZOLOFT) tablet 100 mg (has no administration in time range)  insulin detemir (LEVEMIR) injection 12 Units (has no administration in time range)  oxybutynin (DITROPAN) tablet 5 mg (has no administration in time range)  tamsulosin (FLOMAX) capsule 0.4 mg (has no administration in time range)  ferrous sulfate tablet 325 mg (has no  administration in time range)  melatonin tablet 3 mg (has no administration in time range)  carbidopa-levodopa (SINEMET IR) 25-100 MG per tablet immediate release 2 tablet (has no administration in time range)  methocarbamol (ROBAXIN) tablet 500 mg (has no administration in time range)  baclofen (LIORESAL) tablet 10 mg (has no administration in time range)  gabapentin (NEURONTIN) capsule 600-900 mg (has no administration in time range)  ascorbic acid (VITAMIN C) tablet 500 mg (has no administration in time range)  cholecalciferol (VITAMIN D3) 25 MCG (1000 UNIT) tablet 1,000 Units (has no administration in time range)  magnesium oxide (MAG-OX) tablet 400-800 mg (has no administration in time range)  mometasone-formoterol (DULERA) 100-5 MCG/ACT inhaler 2 puff (has no administration in time range)  loratadine (CLARITIN) tablet 10 mg (has no administration in time range)  tiotropium (SPIRIVA) inhalation capsule ([facility] use ONLY) 18 mcg (has no administration in time range)  insulin aspart (novoLOG) injection 0-6 Units (has no administration in time range)  atorvastatin (LIPITOR) tablet 40 mg (has no administration in time range)  furosemide (LASIX) tablet 20 mg (has no administration in time range)  magnesium sulfate IVPB 2 g 50 mL (has no administration in time range)  umeclidinium  bromide (INCRUSE ELLIPTA) 62.5 MCG/ACT 1 puff (has no administration in time range)  lactated ringers bolus 1,000 mL (0 mLs Intravenous Stopped 06/17/22 1030)  oxyCODONE-acetaminophen (PERCOCET/ROXICET) 5-325 MG per tablet 2 tablet (2 tablets Oral Given 06/17/22 0932)  magnesium sulfate IVPB 2 g 50 mL (0 g Intravenous Stopped 06/17/22 1159)  ceFEPIme (MAXIPIME) 2 g in sodium chloride 0.9 % 100 mL IVPB (0 g Intravenous Stopped 06/17/22 1410)  iohexol (OMNIPAQUE) 350 MG/ML injection 100 mL (100 mLs Intravenous Contrast Given 06/17/22 1252)  oxyCODONE-acetaminophen (PERCOCET/ROXICET) 5-325 MG per tablet 1 tablet (1 tablet Oral  Given 06/17/22 1422)    Mobility manual wheelchair     Focused Assessments Pulmonary Assessment Handoff:  Lung sounds:   O2 Device: Nasal Cannula O2 Flow Rate (L/min): 2 L/min    R Recommendations: See Admitting Provider Note  Report given to:   Additional Notes: 2

## 2022-06-17 NOTE — ED Provider Notes (Signed)
[facility] Provider Note   CSN: [id number] Arrival date & time: 06/17/22  [id number]     History  No chief complaint on file.   Aaron Leon is a 72 y.o. male.  HPI Patient presents for testicular pain.  Medical history includes COPD, DM, HTN, CVA, cognitive impairment.  He has had 2 hospital admissions this month for pneumonia.  After his first admission this month, he was sent home on supplemental oxygen.  Most recently, he was admitted 5 days ago for pneumonia and UTI.  Despite UTI, patient's wife states that they did not change his Foley catheter.  His Foley catheter was last changed in late January.  Following his most recent hospital admission, he was discharged 2 days ago Augmentin and Levaquin.  Last night, patient began to experience bilateral testicular pain.  Since that time, he has seemed unwell to his wife.  He seemed more pale than normal.  Patient has poor appetite at baseline and has difficulty staying hydrated due to not wanting to drink fluids.  He is nonambulatory at baseline.  This is secondary to previous hip fractures as well as deconditioning from his multiple recent hospitalizations.  He is able to pivot at baseline.  He has a motorized wheelchair at home to assist with mobility.  Patient's wife has noted sediment in his Foley catheter.  She states that he has been taking the antibiotics that he was discharged on.  Patient denies any areas of discomfort other than his testicles.    Home Medications Prior to Admission medications   Medication Sig Start Date End Date Taking? Authorizing Provider  acetaminophen (TYLENOL) 500 MG tablet Take 500 mg by mouth in the morning and at bedtime.   Yes [provider]  albuterol (PROVENTIL) (2.5 MG/3ML) 0.083% nebulizer solution Take 2.5 mg by nebulization every 6 (six) hours as needed for wheezing or shortness of breath.   Yes [provider]  albuterol (VENTOLIN HFA)  108 (90 Base) MCG/ACT inhaler Inhale 1 puff into the lungs every 6 (six) hours as needed for wheezing or shortness of breath.   Yes [provider]  amLODipine (NORVASC) 5 MG tablet Take 5 mg by mouth daily.   Yes [provider]  ascorbic acid (VITAMIN C) 500 MG tablet Take 500 mg by mouth daily.   Yes [provider]  aspirin 325 MG tablet Take 325 mg by mouth daily.   Yes [provider]  atorvastatin (LIPITOR) 80 MG tablet Take 80 mg by mouth at bedtime.    Yes [provider]  baclofen (LIORESAL) 10 MG tablet Take 10-20 mg by mouth 2 (two) times daily.   Yes [provider]  budesonide-formoterol (SYMBICORT) 80-4.5 MCG/ACT inhaler Inhale 2 puffs into the lungs 2 (two) times daily.   Yes [provider]  carbidopa-levodopa (SINEMET CR) 50-200 MG tablet Take 1 tablet by mouth at bedtime. 04/20/22  Yes [provider]  carbidopa-levodopa (SINEMET IR) 25-100 MG tablet Take 2 tablets by mouth 3 (three) times daily. 02/20/21  Yes [provider]  cetirizine (ZYRTEC) 10 MG tablet Take 10 mg by mouth in the morning.    Yes [provider]  cholecalciferol (VITAMIN D3) 25 MCG (1000 UNIT) tablet Take 1,000 Units by mouth in the morning and at bedtime.   Yes [provider]  ferrous sulfate 325 (65 FE) MG EC tablet Take 325 mg by mouth in the morning.    Yes [provider]  furosemide (LASIX) 20 MG tablet Take 5 mg by mouth daily.   Yes [provider]  gabapentin (NEURONTIN) 300 MG capsule Take 600-900 mg by mouth See admin instructions. 600mg  in the morning and at noon, then take 900mg  in the evening   Yes [provider]  galantamine (RAZADYNE ER) 24 MG 24 hr capsule Take 24 mg by mouth every evening. 02/20/21  Yes [provider]  insulin aspart (NOVOLOG) 100 UNIT/ML injection Inject 1-2 Units into the skin 3 (three) times daily before meals. Per sliding scale   Yes  [provider]  insulin detemir (LEVEMIR) 100 UNIT/ML injection Inject 12 Units into the skin every morning.   Yes [provider]  lisinopril (ZESTRIL) 10 MG tablet Take 5 mg by mouth daily.   Yes [provider]  magnesium oxide (MAG-OX) 400 (240 Mg) MG tablet Take 400-800 mg by mouth daily. 2 tablets in the morning and 1 tablet in the evening   Yes [provider]  melatonin 3 MG TABS tablet Take 3 mg by mouth at bedtime.   Yes [provider]  memantine (NAMENDA) 10 MG tablet Take 10 mg by mouth 2 (two) times daily.   Yes [provider]  metFORMIN (GLUCOPHAGE) 1000 MG tablet Take 1,000 mg by mouth 2 (two) times daily with a meal.   Yes [provider]  methocarbamol (ROBAXIN) 500 MG tablet Take 500 mg by mouth 4 (four) times daily.   Yes [provider]  metoprolol tartrate (LOPRESSOR) 25 MG tablet Take 25 mg by mouth 2 (two) times daily.   Yes [provider]  omeprazole (PRILOSEC) 20 MG capsule Take 20 mg by mouth in the morning.    Yes [provider]  oxybutynin (DITROPAN) 5 MG tablet Take 5 mg by mouth at bedtime.    Yes [provider]  oxyCODONE-acetaminophen (PERCOCET) 10-325 MG tablet Take 1 tablet by mouth every 6 (six) hours as needed for pain.   Yes [provider]  polyethylene glycol powder (GLYCOLAX/MIRALAX) 17 GM/SCOOP powder Take 17 g by mouth daily as needed for mild constipation or moderate constipation.   Yes [provider]  sertraline (ZOLOFT) 100 MG tablet Take 100 mg by mouth in the morning.    Yes [provider]  tamsulosin (FLOMAX) 0.4 MG CAPS capsule Take 0.4 mg by mouth at bedtime.   Yes [provider]  tiotropium (SPIRIVA) 18 MCG inhalation capsule Place 18 mcg into inhaler and inhale in the morning.    Yes [provider]  zolpidem (AMBIEN) 10 MG tablet Take 10 mg by mouth at bedtime.   Yes [provider]       Allergies    Barbiturates, Seroquel  [quetiapine], and Shellfish allergy    Review of Systems   Review of Systems  Constitutional:  Positive for activity change and fatigue.  Genitourinary:  Positive for testicular pain.  Musculoskeletal:  Positive for back pain.  All other systems reviewed and are negative.   Physical Exam Updated Vital Signs BP 135/71   Pulse 65   Temp 97.7 F (36.5 C) (Oral)   Resp 10   Wt 73 kg   SpO2 94%   BMI 21.83 kg/m  Physical Exam Vitals and nursing note reviewed.  Constitutional:      General: He is not in acute distress.    Appearance: He is well-developed. He is ill-appearing (Chronically). He is not toxic-appearing or diaphoretic.  HENT:  Head: Normocephalic and atraumatic.     Right Ear: External ear normal.     Left Ear: External ear normal.     Nose: Nose normal.     Mouth/Throat:     Mouth: Mucous membranes are moist.  Eyes:     Extraocular Movements: Extraocular movements intact.     Conjunctiva/sclera: Conjunctivae normal.  Cardiovascular:     Rate and Rhythm: Normal rate and regular rhythm.     Heart sounds: No murmur heard. Pulmonary:     Effort: Pulmonary effort is normal. No respiratory distress.     Breath sounds: Normal breath sounds. No wheezing or rales.  Chest:     Chest wall: No tenderness.  Abdominal:     Palpations: Abdomen is soft.     Tenderness: There is abdominal tenderness (LLQ). There is right CVA tenderness and left CVA tenderness.  Genitourinary:    Penis: Normal.      Testes:        Right: Tenderness present. Swelling not present.        Left: Tenderness present. Swelling not present.     Comments: Foley catheter in place Musculoskeletal:        General: No swelling. Normal range of motion.     Cervical back: Normal range of motion and neck supple.     Right lower leg: No edema.     Left lower leg: No edema.  Skin:    General: Skin is warm and dry.     Capillary Refill: Capillary refill takes  less than 2 seconds.     Coloration: Skin is not jaundiced or pale.  Neurological:     General: No focal deficit present.     Mental Status: He is alert. Mental status is at baseline.  Psychiatric:        Mood and Affect: Mood normal.        Behavior: Behavior normal.     ED Results / Procedures / Treatments   Labs (all labs ordered are listed, but only abnormal results are displayed) Labs Reviewed  COMPREHENSIVE METABOLIC PANEL - Abnormal; Notable for the following components:      Result Value   Glucose, Bld 165 (*)    Creatinine, Ser 0.58 (*)    Calcium 8.2 (*)    Total Protein 6.1 (*)    Albumin 2.6 (*)    AST 12 (*)    All other components within normal limits  CBC WITH DIFFERENTIAL/PLATELET - Abnormal; Notable for the following components:   RBC 3.82 (*)    Hemoglobin 10.4 (*)    HCT 33.2 (*)    Platelets 411 (*)    Neutro Abs 8.0 (*)    All other components within normal limits  URINALYSIS, W/ REFLEX TO CULTURE (INFECTION SUSPECTED) - Abnormal; Notable for the following components:   APPearance CLOUDY (*)    Hgb urine dipstick LARGE (*)    Protein, ur 100 (*)    Leukocytes,Ua MODERATE (*)    All other components within normal limits  MAGNESIUM - Abnormal; Notable for the following components:   Magnesium 1.4 (*)    All other components within normal limits  CULTURE, BLOOD (ROUTINE X 2)  RESP PANEL BY RT-PCR (RSV, FLU A&B, COVID)  [id number]  CULTURE, BLOOD (ROUTINE X 2)  URINE CULTURE  LACTIC ACID, PLASMA  TROPONIN I (HIGH SENSITIVITY)  TROPONIN I (HIGH SENSITIVITY)    EKG None  Radiology CT Angio Chest PE W and/or Wo Contrast  Result Date:  06/17/2022 CLINICAL DATA:  High probability of pulmonary embolus. Testicular pain. EXAM: CT ANGIOGRAPHY CHEST CT ABDOMEN AND PELVIS WITH CONTRAST TECHNIQUE: Multidetector CT imaging of the chest was performed using the standard protocol during bolus administration of intravenous contrast. Multiplanar CT image  reconstructions and MIPs were obtained to evaluate the vascular anatomy. Multidetector CT imaging of the abdomen and pelvis was performed using the standard protocol during bolus administration of intravenous contrast. RADIATION DOSE REDUCTION: This exam was performed according to the departmental dose-optimization program which includes automated exposure control, adjustment of the mA and/or kV according to patient size and/or use of iterative reconstruction technique. CONTRAST:  116mL OMNIPAQUE IOHEXOL 350 MG/ML SOLN COMPARISON:  July 08, 2018. FINDINGS: CTA CHEST FINDINGS Cardiovascular: There is a very small filling defect seen in a peripheral lower lobe branch of the left pulmonary artery best seen on image number 152 of series 6. Normal cardiac size. No pericardial effusion. Extensive coronary artery calcifications are noted. Mediastinum/Nodes: Small sliding-type hiatal hernia. No adenopathy. Thyroid gland is unremarkable. Lungs/Pleura: No pneumothorax is noted. Small left pleural effusion is noted with adjacent subsegmental atelectasis. Multiple patchy airspace opacities are noted throughout both lungs, most prominently seen in both upper lobes posteriorly, suggesting multifocal pneumonia. Musculoskeletal: Old right rib fractures are noted. No acute osseous abnormality is noted. Review of the MIP images confirms the above findings. CT ABDOMEN and PELVIS FINDINGS Hepatobiliary: No focal liver abnormality is seen. No gallstones, gallbladder wall thickening, or biliary dilatation. Pancreas: Unremarkable. No pancreatic ductal dilatation or surrounding inflammatory changes. Spleen: Normal in size without focal abnormality. Adrenals/Urinary Tract: Adrenal glands and kidneys appear normal. No hydronephrosis or renal obstruction is noted. Urinary bladder is decompressed secondary to Foley catheter. Stomach/Bowel: Stomach is within normal limits. Appendix appears normal. No evidence of bowel wall thickening,  distention, or inflammatory changes. Vascular/Lymphatic: Aortic atherosclerosis. No enlarged abdominal or pelvic lymph nodes. Reproductive: Prostate gland is not well visualized due to scatter artifact arising from right hip prosthesis. Other: No abdominal wall hernia or abnormality. No abdominopelvic ascites. Musculoskeletal: Status post right total hip arthroplasty. Status post surgical internal fixation of old proximal left femoral fracture. Probable old T12 fracture is noted. No acute osseous abnormality is noted. Review of the MIP images confirms the above findings. IMPRESSION: Small pulmonary embolus seen in peripheral lower lobe branch of left pulmonary artery. Critical Value/emergent results were called by telephone at the time of interpretation on 06/17/2022 at 1:12 pm to provider Godfrey Pick , who verbally acknowledged these results. Multiple ill-defined patchy opacities are noted in both lungs suggesting multifocal pneumonia. Small left pleural effusion is noted with minimal adjacent subsegmental atelectasis. Extensive coronary artery calcifications are noted suggesting coronary disease. Small sliding-type hiatal hernia. No acute abnormality seen in the abdomen or pelvis. Aortic Atherosclerosis (ICD10-I70.0). Electronically Signed   By: Marijo Conception M.D.   On: 06/17/2022 13:12   CT ABDOMEN PELVIS W CONTRAST  Result Date: 06/17/2022 CLINICAL DATA:  High probability of pulmonary embolus. Testicular pain. EXAM: CT ANGIOGRAPHY CHEST CT ABDOMEN AND PELVIS WITH CONTRAST TECHNIQUE: Multidetector CT imaging of the chest was performed using the standard protocol during bolus administration of intravenous contrast. Multiplanar CT image reconstructions and MIPs were obtained to evaluate the vascular anatomy. Multidetector CT imaging of the abdomen and pelvis was performed using the standard protocol during bolus administration of intravenous contrast. RADIATION DOSE REDUCTION: This exam was performed according  to the departmental dose-optimization program which includes automated exposure control, adjustment of the mA and/or  kV according to patient size and/or use of iterative reconstruction technique. CONTRAST:  159mL OMNIPAQUE IOHEXOL 350 MG/ML SOLN COMPARISON:  July 08, 2018. FINDINGS: CTA CHEST FINDINGS Cardiovascular: There is a very small filling defect seen in a peripheral lower lobe branch of the left pulmonary artery best seen on image number 152 of series 6. Normal cardiac size. No pericardial effusion. Extensive coronary artery calcifications are noted. Mediastinum/Nodes: Small sliding-type hiatal hernia. No adenopathy. Thyroid gland is unremarkable. Lungs/Pleura: No pneumothorax is noted. Small left pleural effusion is noted with adjacent subsegmental atelectasis. Multiple patchy airspace opacities are noted throughout both lungs, most prominently seen in both upper lobes posteriorly, suggesting multifocal pneumonia. Musculoskeletal: Old right rib fractures are noted. No acute osseous abnormality is noted. Review of the MIP images confirms the above findings. CT ABDOMEN and PELVIS FINDINGS Hepatobiliary: No focal liver abnormality is seen. No gallstones, gallbladder wall thickening, or biliary dilatation. Pancreas: Unremarkable. No pancreatic ductal dilatation or surrounding inflammatory changes. Spleen: Normal in size without focal abnormality. Adrenals/Urinary Tract: Adrenal glands and kidneys appear normal. No hydronephrosis or renal obstruction is noted. Urinary bladder is decompressed secondary to Foley catheter. Stomach/Bowel: Stomach is within normal limits. Appendix appears normal. No evidence of bowel wall thickening, distention, or inflammatory changes. Vascular/Lymphatic: Aortic atherosclerosis. No enlarged abdominal or pelvic lymph nodes. Reproductive: Prostate gland is not well visualized due to scatter artifact arising from right hip prosthesis. Other: No abdominal wall hernia or abnormality. No  abdominopelvic ascites. Musculoskeletal: Status post right total hip arthroplasty. Status post surgical internal fixation of old proximal left femoral fracture. Probable old T12 fracture is noted. No acute osseous abnormality is noted. Review of the MIP images confirms the above findings. IMPRESSION: Small pulmonary embolus seen in peripheral lower lobe branch of left pulmonary artery. Critical Value/emergent results were called by telephone at the time of interpretation on 06/17/2022 at 1:12 pm to provider Godfrey Pick , who verbally acknowledged these results. Multiple ill-defined patchy opacities are noted in both lungs suggesting multifocal pneumonia. Small left pleural effusion is noted with minimal adjacent subsegmental atelectasis. Extensive coronary artery calcifications are noted suggesting coronary disease. Small sliding-type hiatal hernia. No acute abnormality seen in the abdomen or pelvis. Aortic Atherosclerosis (ICD10-I70.0). Electronically Signed   By: Marijo Conception M.D.   On: 06/17/2022 13:12   DG Chest Port 1 View  Result Date: 06/17/2022 CLINICAL DATA:  Questionable sepsis - evaluate for abnormality EXAM: PORTABLE CHEST 1 VIEW COMPARISON:  Radiograph 04/20/2022, chest CT 07/08/2018 FINDINGS: Unchanged cardiomediastinal silhouette. Diffuse interstitial prominence with new reticular and faint airspace opacities in the left upper lung, airspace opacities in the left medial lung base, and airspace disease in the peripheral right mid lung. Thoracic spondylosis. There is a distal left clavicle fracture which appears subacute. Old right rib fractures noted. IMPRESSION: New bilateral airspace disease in the left upper lung, left medial lung base, and right peripheral mid lung concerning for multifocal pneumonia. Recommend radiographic follow-up. Distal left clavicle fracture which appears subacute. Electronically Signed   By: Maurine Simmering M.D.   On: 06/17/2022 10:17   US SCROTUM W/DOPPLER  Result  Date: 06/17/2022 CLINICAL DATA:  Scrotal pain EXAM: SCROTAL ULTRASOUND DOPPLER ULTRASOUND OF THE TESTICLES TECHNIQUE: Complete ultrasound examination of the testicles, epididymis, and other scrotal structures was performed. Color and spectral Doppler ultrasound were also utilized to evaluate blood flow to the testicles. COMPARISON:  None Available. FINDINGS: Right testicle Measurements: 2.7 x 2.8 x 2.7 cm. No mass or microlithiasis visualized. Left  testicle Measurements: 3.4 x 3 x 2.6 cm. There is 5 mm cyst in the left testis. No other focal abnormalities are seen. Right epididymis:  Normal in size and appearance. Left epididymis:  Normal in size and appearance. Hydrocele:  Small bilateral hydrocele is present. Varicocele:  None visualized. Pulsed Doppler interrogation of both testes demonstrates normal low resistance arterial and venous waveforms bilaterally. IMPRESSION: There is no evidence of testicular torsion. There is 5 mm cyst in the left testis. No other focal abnormalities are seen in both testes. Small bilateral hydrocele. Electronically Signed   By: Elmer Picker M.D.   On: 06/17/2022 10:10    Procedures Procedures    Medications Ordered in ED Medications  ceFEPIme (MAXIPIME) 2 g in sodium chloride 0.9 % 100 mL IVPB (has no administration in time range)  doxycycline (VIBRA-TABS) tablet 100 mg (has no administration in time range)  lactated ringers bolus 1,000 mL (0 mLs Intravenous Stopped 06/17/22 1030)  oxyCODONE-acetaminophen (PERCOCET/ROXICET) 5-325 MG per tablet 2 tablet (2 tablets Oral Given 06/17/22 0932)  magnesium sulfate IVPB 2 g 50 mL (0 g Intravenous Stopped 06/17/22 1159)  ceFEPIme (MAXIPIME) 2 g in sodium chloride 0.9 % 100 mL IVPB (0 g Intravenous Stopped 06/17/22 1410)  iohexol (OMNIPAQUE) 350 MG/ML injection 100 mL (100 mLs Intravenous Contrast Given 06/17/22 1252)    ED Course/ Medical Decision Making/ A&P                             Medical Decision Making Amount  and/or Complexity of Data Reviewed Labs: ordered. Radiology: ordered. ECG/medicine tests: ordered.  Risk Prescription drug management.   This patient presents to the ED for concern of testicular pain and generalized weakness, this involves an extensive number of treatment options, and is a complaint that carries with it a high risk of complications and morbidity.  The differential diagnosis includes UTI, epididymitis, orchitis, worsened pneumonia, dehydration, deconditioning   Co morbidities that complicate the patient evaluation  COPD, DM, HTN, CVA, cognitive impairment   Additional history obtained:  Additional history obtained from patient's wife External records from outside source obtained and reviewed including EMR   Lab Tests:  I Ordered, and personally interpreted labs.  The pertinent results include: Anemia is consistent with recent lab work.  No leukocytosis is present.  There is a mild thrombocytosis.  Hypomagnesemia is present.  Urinalysis is consistent with UTI.   Imaging Studies ordered:  I ordered imaging studies including x-ray of chest, ultrasound of scrotum, CTA chest, CT of abdomen and pelvis I independently visualized and interpreted imaging which showed ultrasound showed bilateral hydroceles and a 5 mm cyst in left testis.  No other acute findings were identified.  On CTA chest, there is a small embolus in the peripheral left lower lobe.  There are also findings of ill-defined patchy opacities in both lungs consistent with multifocal pneumonia.  No obstructive uropathy or any other acute findings are identified in the abdomen or pelvis. I agree with the radiologist interpretation   Cardiac Monitoring: / EKG:  The patient was maintained on a cardiac monitor.  I personally viewed and interpreted the cardiac monitored which showed an underlying rhythm of: Sinus rhythm  Problem List / ED Course / Critical interventions / Medication management  Patient  presents for bilateral testicular pain and decreased activity since last night.  He has had multiple recent hospitalizations, including only last week.  He is currently being treated for pneumonia  and UTI.  Despite recent diagnosis of UTI, he has not had a Foley catheter change in the last 2 months.  Vital signs on arrival are notable for hypertension.  SpO2 is in the low 90s on 2 L of supplemental oxygen.  On exam, patient's abdomen is soft but he is endorsing tenderness in the left lower quadrant.  He endorses bilateral testicular tenderness.  Patient does have chronic back pain but states that he has new back pain since last night.  He does have bilateral CVA tenderness on exam.  Patient takes oxycodone 10 mg tablets chronically for chronic pain.  Pain medication was ordered.  Foley to be exchanged.  Diagnostic workup was initiated.  Urine was obtained after Foley exchange.  Urinalysis does show continued evidence of UTI.  Given his recent worsening symptoms, including new CVA pain and tenderness, patient was treated with cefepime for UTI.  CT of chest showed findings consistent with multifocal pneumonia.  It is unclear if these have worsened from his recent CT scan 5 days ago given absence of access to these images.  He was found to have a new small pulmonary embolus.  Heparin was started.  Patient was admitted to medicine for further management. I ordered medication including Percocet for analgesia; IVF for hydration; magnesium sulfate for hypomagnesemia; antibiotics for UTI/pneumonia; heparin for PE  Reevaluation of the patient after these medicines showed that the patient improved I have reviewed the patients home medicines and have made adjustments as needed   Social Determinants of Health:  Multiple recent hospitalizations         Final Clinical Impression(s) / ED Diagnoses Final diagnoses:  Multifocal pneumonia  Single subsegmental pulmonary embolism without acute cor pulmonale ([place])   Urinary tract infection with hematuria, site unspecified    Rx / DC Orders ED Discharge Orders     None         Godfrey Pick, MD 06/17/22 1412

## 2022-06-17 NOTE — ED Notes (Signed)
Patient transported to Ultrasound 

## 2022-06-17 NOTE — Assessment & Plan Note (Signed)
Asymptomatic, denies any chest pain or shortness of breath SpO2 94 %.  On 2 L of oxygen -at baseline  -Initially patient was started on heparin drip, switching to p.o. Eliquis  -Will obtain 2D echocardiogram

## 2022-06-17 NOTE — ED Notes (Signed)
Patient transported to CT 

## 2022-06-17 NOTE — Assessment & Plan Note (Signed)
-  Acute on chronic severe debility, at baseline ambulates with wheelchair, motorized wheelchair at home -Unknown underlying etiology: neuromuscular versus h/o stroke,  prolonged chronic illness progressing to debility, prolonged hospitalizations associate with neuropathy  -According to his wife he had a hip fracture surgery this past January he has not been able to ambulate since  -Consulting PT/OT -patient recommendation -The patient and his wife report of active home health that is in place and will continue to follow

## 2022-06-17 NOTE — Assessment & Plan Note (Addendum)
-  Epididymitis versus acute on chronic UTI mentation -With Foley catheter exchanged in ED -Continue to monitor closely -Ultrasound revealed negative for any testicular torsion MPRESSION::  There is no evidence of testicular torsion. There is 5 mm cyst in the left testis. No other focal abnormalities are seen in both testes. Small bilateral hydrocele.

## 2022-06-17 NOTE — ED Notes (Signed)
Pt reports scrotal pain has subsided since catheter change. Sts "it made a world of difference."

## 2022-06-17 NOTE — Assessment & Plan Note (Addendum)
-  Possible neurogenic bladder, chronic Foley catheter has been placed previous hospitalization for past 2 months, last changed in January 2024 per patient and his wife -Recent hospitalization at [facility] few days ago Foley catheter was not changed -In ED Foley catheter was exchanged Testicular pain improved  -Continue home medication of Flomax, oxybutynin

## 2022-06-17 NOTE — Assessment & Plan Note (Signed)
Were holding home medication of metformin -Continue home medication of Levemir -Checking CBG q. ACHS, SSI coverage -Checking A1c

## 2022-06-17 NOTE — Assessment & Plan Note (Signed)
-   On high-dose statin Lipitor 80 mg daily--due to progressive generalized weakness, will check LFTs, fasting lipid panel, reduce the dose to 40 mg daily

## 2022-06-17 NOTE — Hospital Course (Signed)
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  After first admission 2 months ago was sent home with supplemental oxygen. Was treated at [facility] facility for UTI and pneumonia 5 days ago.[name]  History of chronic Foley catheter placed 2 months -was last changed in January 2024. Was not changed on last hospitalization. Patient was discharged on Augmentin and Levaquin 2 days ago.  Presenting today with testicular pain.  Patient and his wife his symptoms started last night-bilateral testicular pain, wife noted of sediment in his Foley catheter.  Wife reports he does not look well.  He seen weak overall, poor appetite, with poor p.o. intake solids and liquids.... Reporting he is not ambulatory at baseline -previous history of hip fractures, deconditioning multiple hospitalizations pivots at baseline, uses wheelchair.  Has motorized wheelchair at home.    ED evaluation/course: Blood pressure (!) 148/74, pulse 73, Temp. 97.7 F (36.5 C), temperature source Oral, RR (!) 26, weight 73 kg, SpO2 94 %.  On 2 L of oxygen  CBC WBC 9.9, hemoglobin 10.4, platelets 411, CMP sodium 139, potassium 3.9, BUN 10, creatinine 0.58, calcium 8.2, troponin 3, glucose 165, lactic acid 1.3, magnesium 1.4 UA: Cloudy, hemoglobin, moderate leukocyte, bacteria none, UA WBC>50  CTA: Abdomen pelvis/chest And pelvis organs within normal limits, Chest small PE peripheral left lower lobe branch artery Multiple ill-defined patchy opacities are noted in both lungs suggesting multifocal pneumonia.  Small left pleural effusion subsegmental atelectasis.  Extensive CAD   Patient started on IV fluids, broad-spectrum antibiotics of cefepime and doxycycline  Blood and Urine cultures have been obtained, IVF, heparin

## 2022-06-17 NOTE — Assessment & Plan Note (Addendum)
-  Currently stable, continue home medication of Norvasc, lisinopril, metoprolol, Lasix

## 2022-06-17 NOTE — ED Notes (Signed)
ED Provider at bedside. 

## 2022-06-17 NOTE — Assessment & Plan Note (Signed)
Multifactorial ?  History of Parkinson dementia -progressive neurological disorder, currently severe debility neuropathy  -Home medications reviewed, on Sinemet, Namenda, galantamine

## 2022-06-17 NOTE — H&P (Signed)
History and Physical   Patient: Aaron Leon                            PCP: [facility]                    DOB: Aug 11, 1950            DOA: 06/17/2022 [id number]             DOS: 06/17/2022, 3:35 PM  [facility], [facility]  Patient coming from:   HOME  I have personally reviewed patient's medical records, in electronic medical records, including:  [place] link, and care everywhere.    Chief Complaint:   Testicular pain, generalized weakness   History of present illness:    Zoe Honn is a a 72 year old male with a significant history of HTN, HLD, chronic urinary retention, chronic anemia, COPD, DM 2, CVA, cognitive impairment (dementia with??Parkinson disease) Recent hospitalization this month for pneumonia.  After first admission 2 months ago was sent home with supplemental oxygen. Was treated at [facility] facility for UTI and pneumonia 5 days ago.[name]  History of chronic Foley catheter placed 2 months -was last changed in January 2024. Was not changed on last hospitalization. Patient was discharged on Augmentin and Levaquin 2 days ago.  Presenting today with testicular pain.  Patient and his wife his symptoms started last night-bilateral testicular pain, wife noted of sediment in his Foley catheter.  Wife reports he does not look well.  He seen weak overall, poor appetite, with poor p.o. intake solids and liquids.... Reporting he is not ambulatory at baseline -previous history of hip fractures, deconditioning multiple hospitalizations pivots at baseline, uses wheelchair.  Has motorized wheelchair at home.    ED evaluation/course: Blood pressure (!) 148/74, pulse 73, Temp. 97.7 F (36.5 C), temperature source Oral, RR (!) 26, weight 73 kg, SpO2 94 %.  On 2 L of oxygen  CBC WBC 9.9, hemoglobin 10.4, platelets 411, CMP sodium 139, potassium 3.9, BUN 10, creatinine 0.58, calcium 8.2, troponin 3, glucose 165, lactic acid 1.3, magnesium 1.4 UA: Cloudy,  hemoglobin, moderate leukocyte, bacteria none, UA WBC>50  CTA: Abdomen pelvis/chest And pelvis organs within normal limits, Chest small PE peripheral left lower lobe branch artery Multiple ill-defined patchy opacities are noted in both lungs suggesting multifocal pneumonia.  Small left pleural effusion subsegmental atelectasis.  Extensive CAD   Patient started on IV fluids, broad-spectrum antibiotics of cefepime and doxycycline  Blood and Urine cultures have been obtained, IVF, heparin      Patient Denies having: Fever, Chills, Cough, SOB, Chest Pain, Abd pain, N/V/D, headache, dizziness, lightheadedness,  Dysuria, Joint pain, rash, open wounds     Review of Systems: As per HPI, otherwise 10 point review of systems were negative.   ----------------------------------------------------------------------------------------------------------------------  Allergies  Allergen Reactions   Barbiturates Other (See Comments)    Blacks out Blacks out    Seroquel  [Quetiapine]     Other reaction(s): Hallucination   Shellfish Allergy Hives    Home MEDs:  Prior to Admission medications   Medication Sig Start Date End Date Taking? Authorizing Provider  acetaminophen (TYLENOL) 500 MG tablet Take 500 mg by mouth in the morning and at bedtime.   Yes [provider]  albuterol (PROVENTIL) (2.5 MG/3ML) 0.083% nebulizer solution Take 2.5 mg by nebulization every 6 (six) hours as needed for wheezing or shortness of breath.   Yes [provider]  albuterol (VENTOLIN HFA) 108 (90 Base) MCG/ACT inhaler Inhale 1 puff into the lungs every 6 (six) hours as needed for wheezing or shortness of breath.   Yes [provider]  amLODipine (NORVASC) 5 MG tablet Take 5 mg by mouth daily.   Yes [provider]  ascorbic acid (VITAMIN C) 500 MG tablet Take 500 mg by mouth daily.   Yes [provider]  aspirin 325 MG tablet Take 325 mg by mouth daily.   Yes  [provider]  atorvastatin (LIPITOR) 80 MG tablet Take 80 mg by mouth at bedtime.    Yes [provider]  baclofen (LIORESAL) 10 MG tablet Take 10-20 mg by mouth 2 (two) times daily.   Yes [provider]  budesonide-formoterol (SYMBICORT) 80-4.5 MCG/ACT inhaler Inhale 2 puffs into the lungs 2 (two) times daily.   Yes [provider]  carbidopa-levodopa (SINEMET CR) 50-200 MG tablet Take 1 tablet by mouth at bedtime. 04/20/22  Yes [provider]  carbidopa-levodopa (SINEMET IR) 25-100 MG tablet Take 2 tablets by mouth 3 (three) times daily. 02/20/21  Yes [provider]  cetirizine (ZYRTEC) 10 MG tablet Take 10 mg by mouth in the morning.    Yes [provider]  cholecalciferol (VITAMIN D3) 25 MCG (1000 UNIT) tablet Take 1,000 Units by mouth in the morning and at bedtime.   Yes [provider]  ferrous sulfate 325 (65 FE) MG EC tablet Take 325 mg by mouth in the morning.    Yes [provider]  furosemide (LASIX) 20 MG tablet Take 5 mg by mouth daily.   Yes [provider]  gabapentin (NEURONTIN) 300 MG capsule Take 600-900 mg by mouth See admin instructions. 600mg  in the morning and at noon, then take 900mg  in the evening   Yes [provider]  galantamine (RAZADYNE ER) 24 MG 24 hr capsule Take 24 mg by mouth every evening. 02/20/21  Yes [provider]  insulin aspart (NOVOLOG) 100 UNIT/ML injection Inject 1-2 Units into the skin 3 (three) times daily before meals. Per sliding scale   Yes [provider]  insulin detemir (LEVEMIR) 100 UNIT/ML injection Inject 12 Units into the skin every morning.   Yes [provider]  lisinopril (ZESTRIL) 10 MG tablet Take 5 mg by mouth daily.   Yes [provider]  magnesium oxide (MAG-OX) 400 (240 Mg) MG tablet Take 400-800 mg by mouth daily. 2 tablets in the morning and 1 tablet in the evening   Yes [provider]   melatonin 3 MG TABS tablet Take 3 mg by mouth at bedtime.   Yes [provider]  memantine (NAMENDA) 10 MG tablet Take 10 mg by mouth 2 (two) times daily.   Yes [provider]  metFORMIN (GLUCOPHAGE) 1000 MG tablet Take 1,000 mg by mouth 2 (two) times daily with a meal.   Yes [provider]  methocarbamol (ROBAXIN) 500 MG tablet Take 500 mg by mouth 4 (four) times daily.   Yes [provider]  metoprolol tartrate (LOPRESSOR) 25 MG tablet Take 25 mg by mouth 2 (two) times daily.   Yes [provider]  omeprazole (PRILOSEC) 20 MG capsule Take 20 mg by mouth in the morning.    Yes [provider]  oxybutynin (DITROPAN) 5 MG tablet Take 5 mg by mouth at bedtime.    Yes [provider]  oxyCODONE-acetaminophen (PERCOCET) 10-325 MG tablet Take 1 tablet by mouth every  6 (six) hours as needed for pain.   Yes [provider]  polyethylene glycol powder (GLYCOLAX/MIRALAX) 17 GM/SCOOP powder Take 17 g by mouth daily as needed for mild constipation or moderate constipation.   Yes [provider]  sertraline (ZOLOFT) 100 MG tablet Take 100 mg by mouth in the morning.    Yes [provider]  tamsulosin (FLOMAX) 0.4 MG CAPS capsule Take 0.4 mg by mouth at bedtime.   Yes [provider]  tiotropium (SPIRIVA) 18 MCG inhalation capsule Place 18 mcg into inhaler and inhale in the morning.    Yes [provider]  zolpidem (AMBIEN) 10 MG tablet Take 10 mg by mouth at bedtime.   Yes [provider]    PRN MEDs: acetaminophen **OR** acetaminophen, baclofen, bisacodyl, hydrALAZINE, HYDROmorphone (DILAUDID) injection, ipratropium, levalbuterol, ondansetron **OR** ondansetron (ZOFRAN) IV, oxyCODONE, senna-docusate, sodium phosphate, traZODone  Past Medical History:  Diagnosis Date   COPD (chronic obstructive pulmonary disease) ([place])    Diabetes mellitus without complication ([place])    "type 2"    Hypertension    Stroke [facility])     Past Surgical History:  Procedure Laterality Date   BACK SURGERY     CATARACT EXTRACTION     CERVICAL FUSION     KNEE SURGERY       reports that he has quit smoking. His smoking use included e-cigarettes. He has never used smokeless tobacco. He reports that he does not currently use alcohol. He reports that he does not use drugs.   No family history on file.  Physical Exam:   Vitals:   06/17/22 1330 06/17/22 1335 06/17/22 1400 06/17/22 1430  BP: 135/71  138/72 (!) 148/74  Pulse: 65  69 73  Resp: 10  16 (!) 26  Temp:  97.7 F (36.5 C)    TempSrc:  Oral    SpO2: 94%  93% 94%  Weight:       Constitutional: NAD, calm, comfortable Eyes: PERRL, lids and conjunctivae normal ENMT: Mucous membranes are moist. Posterior pharynx clear of any exudate or lesions.Normal dentition.  Neck: normal, supple, no masses, no thyromegaly Respiratory: clear to auscultation bilaterally, no wheezing, no crackles. Normal respiratory effort. No accessory muscle use.  Cardiovascular: Regular rate and rhythm, no murmurs / rubs / gallops. No extremity edema. 2+ pedal pulses. No carotid bruits.  Abdomen: no tenderness, no masses palpated. No hepatosplenomegaly. Bowel sounds positive.  Musculoskeletal: Severe generalized weakness at baseline, ambulates with assist only, wheelchair,  no clubbing / cyanosis. No joint deformity upper and lower extremities. Good ROM, no contractures. Normal muscle tone.  Neurologic: None ambulating at baseline, CN II-XII grossly intact. Sensation intact, DTR normal. Strength 5/5 in all 4.  Psychiatric: Normal judgment and insight. Alert and oriented x 3. Normal mood.  Skin: no rashes, lesions, ulcers. No induration Decubitus/ulcers:  Wounds: per nursing documentation         Labs on admission:    I have personally reviewed following labs and imaging studies  CBC: Recent Labs  Lab 06/17/22 0901  WBC 9.9  NEUTROABS 8.0*  HGB  10.4*  HCT 33.2*  MCV 86.9  PLT 123456*   Basic Metabolic Panel: Recent Labs  Lab 06/17/22 0901  NA 139  K 3.9  CL 103  CO2 26  GLUCOSE 165*  BUN 10  CREATININE 0.58*  CALCIUM 8.2*  MG 1.4*  PHOS 3.0   GFR: CrCl cannot be calculated (Unknown ideal weight.). Liver Function Tests: Recent Labs  Lab 06/17/22  0901  AST 12*  ALT 8  ALKPHOS 86  BILITOT 0.5  PROT 6.1*  ALBUMIN 2.6*    Urine analysis:    Component Value Date/Time   COLORURINE YELLOW 06/17/2022 0915   APPEARANCEUR CLOUDY (A) 06/17/2022 0915   LABSPEC 1.015 06/17/2022 0915   PHURINE 5.0 06/17/2022 0915   GLUCOSEU NEGATIVE 06/17/2022 0915   HGBUR LARGE (A) 06/17/2022 0915   BILIRUBINUR NEGATIVE 06/17/2022 0915   KETONESUR NEGATIVE 06/17/2022 0915   PROTEINUR 100 (A) 06/17/2022 0915   NITRITE NEGATIVE 06/17/2022 0915   LEUKOCYTESUR MODERATE (A) 06/17/2022 0915    Last A1C:  Lab Results  Component Value Date   HGBA1C (H) 03/19/2008    6.8 (NOTE)   The ADA recommends the following therapeutic goal for glycemic   control related to Hgb A1C measurement:   Goal of Therapy:   < 7.0% Hgb A1C   Reference: American Diabetes Association: Clinical Practice   Recommendations 2008, Diabetes Care,  2008, 31:(Suppl 1).     Radiologic Exams on Admission:   CT Angio Chest PE W and/or Wo Contrast  Result Date: 06/17/2022 CLINICAL DATA:  High probability of pulmonary embolus. Testicular pain. EXAM: CT ANGIOGRAPHY CHEST CT ABDOMEN AND PELVIS WITH CONTRAST TECHNIQUE: Multidetector CT imaging of the chest was performed using the standard protocol during bolus administration of intravenous contrast. Multiplanar CT image reconstructions and MIPs were obtained to evaluate the vascular anatomy. Multidetector CT imaging of the abdomen and pelvis was performed using the standard protocol during bolus administration of intravenous contrast. RADIATION DOSE REDUCTION: This exam was performed according to the departmental  dose-optimization program which includes automated exposure control, adjustment of the mA and/or kV according to patient size and/or use of iterative reconstruction technique. CONTRAST:  176mL OMNIPAQUE IOHEXOL 350 MG/ML SOLN COMPARISON:  July 08, 2018. FINDINGS: CTA CHEST FINDINGS Cardiovascular: There is a very small filling defect seen in a peripheral lower lobe branch of the left pulmonary artery best seen on image number 152 of series 6. Normal cardiac size. No pericardial effusion. Extensive coronary artery calcifications are noted. Mediastinum/Nodes: Small sliding-type hiatal hernia. No adenopathy. Thyroid gland is unremarkable. Lungs/Pleura: No pneumothorax is noted. Small left pleural effusion is noted with adjacent subsegmental atelectasis. Multiple patchy airspace opacities are noted throughout both lungs, most prominently seen in both upper lobes posteriorly, suggesting multifocal pneumonia. Musculoskeletal: Old right rib fractures are noted. No acute osseous abnormality is noted. Review of the MIP images confirms the above findings. CT ABDOMEN and PELVIS FINDINGS Hepatobiliary: No focal liver abnormality is seen. No gallstones, gallbladder wall thickening, or biliary dilatation. Pancreas: Unremarkable. No pancreatic ductal dilatation or surrounding inflammatory changes. Spleen: Normal in size without focal abnormality. Adrenals/Urinary Tract: Adrenal glands and kidneys appear normal. No hydronephrosis or renal obstruction is noted. Urinary bladder is decompressed secondary to Foley catheter. Stomach/Bowel: Stomach is within normal limits. Appendix appears normal. No evidence of bowel wall thickening, distention, or inflammatory changes. Vascular/Lymphatic: Aortic atherosclerosis. No enlarged abdominal or pelvic lymph nodes. Reproductive: Prostate gland is not well visualized due to scatter artifact arising from right hip prosthesis. Other: No abdominal wall hernia or abnormality. No abdominopelvic  ascites. Musculoskeletal: Status post right total hip arthroplasty. Status post surgical internal fixation of old proximal left femoral fracture. Probable old T12 fracture is noted. No acute osseous abnormality is noted. Review of the MIP images confirms the above findings. IMPRESSION: Small pulmonary embolus seen in peripheral lower lobe branch of left pulmonary artery. Critical Value/emergent results were called by  telephone at the time of interpretation on 06/17/2022 at 1:12 pm to provider Godfrey Pick , who verbally acknowledged these results. Multiple ill-defined patchy opacities are noted in both lungs suggesting multifocal pneumonia. Small left pleural effusion is noted with minimal adjacent subsegmental atelectasis. Extensive coronary artery calcifications are noted suggesting coronary disease. Small sliding-type hiatal hernia. No acute abnormality seen in the abdomen or pelvis. Aortic Atherosclerosis (ICD10-I70.0). Electronically Signed   By: Marijo Conception M.D.   On: 06/17/2022 13:12   CT ABDOMEN PELVIS W CONTRAST  Result Date: 06/17/2022 CLINICAL DATA:  High probability of pulmonary embolus. Testicular pain. EXAM: CT ANGIOGRAPHY CHEST CT ABDOMEN AND PELVIS WITH CONTRAST TECHNIQUE: Multidetector CT imaging of the chest was performed using the standard protocol during bolus administration of intravenous contrast. Multiplanar CT image reconstructions and MIPs were obtained to evaluate the vascular anatomy. Multidetector CT imaging of the abdomen and pelvis was performed using the standard protocol during bolus administration of intravenous contrast. RADIATION DOSE REDUCTION: This exam was performed according to the departmental dose-optimization program which includes automated exposure control, adjustment of the mA and/or kV according to patient size and/or use of iterative reconstruction technique. CONTRAST:  123mL OMNIPAQUE IOHEXOL 350 MG/ML SOLN COMPARISON:  July 08, 2018. FINDINGS: CTA CHEST FINDINGS  Cardiovascular: There is a very small filling defect seen in a peripheral lower lobe branch of the left pulmonary artery best seen on image number 152 of series 6. Normal cardiac size. No pericardial effusion. Extensive coronary artery calcifications are noted. Mediastinum/Nodes: Small sliding-type hiatal hernia. No adenopathy. Thyroid gland is unremarkable. Lungs/Pleura: No pneumothorax is noted. Small left pleural effusion is noted with adjacent subsegmental atelectasis. Multiple patchy airspace opacities are noted throughout both lungs, most prominently seen in both upper lobes posteriorly, suggesting multifocal pneumonia. Musculoskeletal: Old right rib fractures are noted. No acute osseous abnormality is noted. Review of the MIP images confirms the above findings. CT ABDOMEN and PELVIS FINDINGS Hepatobiliary: No focal liver abnormality is seen. No gallstones, gallbladder wall thickening, or biliary dilatation. Pancreas: Unremarkable. No pancreatic ductal dilatation or surrounding inflammatory changes. Spleen: Normal in size without focal abnormality. Adrenals/Urinary Tract: Adrenal glands and kidneys appear normal. No hydronephrosis or renal obstruction is noted. Urinary bladder is decompressed secondary to Foley catheter. Stomach/Bowel: Stomach is within normal limits. Appendix appears normal. No evidence of bowel wall thickening, distention, or inflammatory changes. Vascular/Lymphatic: Aortic atherosclerosis. No enlarged abdominal or pelvic lymph nodes. Reproductive: Prostate gland is not well visualized due to scatter artifact arising from right hip prosthesis. Other: No abdominal wall hernia or abnormality. No abdominopelvic ascites. Musculoskeletal: Status post right total hip arthroplasty. Status post surgical internal fixation of old proximal left femoral fracture. Probable old T12 fracture is noted. No acute osseous abnormality is noted. Review of the MIP images confirms the above findings. IMPRESSION:  Small pulmonary embolus seen in peripheral lower lobe branch of left pulmonary artery. Critical Value/emergent results were called by telephone at the time of interpretation on 06/17/2022 at 1:12 pm to provider Godfrey Pick , who verbally acknowledged these results. Multiple ill-defined patchy opacities are noted in both lungs suggesting multifocal pneumonia. Small left pleural effusion is noted with minimal adjacent subsegmental atelectasis. Extensive coronary artery calcifications are noted suggesting coronary disease. Small sliding-type hiatal hernia. No acute abnormality seen in the abdomen or pelvis. Aortic Atherosclerosis (ICD10-I70.0). Electronically Signed   By: Marijo Conception M.D.   On: 06/17/2022 13:12   DG Chest Port 1 View  Result Date: 06/17/2022 CLINICAL DATA:  Questionable sepsis - evaluate for abnormality EXAM: PORTABLE CHEST 1 VIEW COMPARISON:  Radiograph 04/20/2022, chest CT 07/08/2018 FINDINGS: Unchanged cardiomediastinal silhouette. Diffuse interstitial prominence with new reticular and faint airspace opacities in the left upper lung, airspace opacities in the left medial lung base, and airspace disease in the peripheral right mid lung. Thoracic spondylosis. There is a distal left clavicle fracture which appears subacute. Old right rib fractures noted. IMPRESSION: New bilateral airspace disease in the left upper lung, left medial lung base, and right peripheral mid lung concerning for multifocal pneumonia. Recommend radiographic follow-up. Distal left clavicle fracture which appears subacute. Electronically Signed   By: Maurine Simmering M.D.   On: 06/17/2022 10:17   US SCROTUM W/DOPPLER  Result Date: 06/17/2022 CLINICAL DATA:  Scrotal pain EXAM: SCROTAL ULTRASOUND DOPPLER ULTRASOUND OF THE TESTICLES TECHNIQUE: Complete ultrasound examination of the testicles, epididymis, and other scrotal structures was performed. Color and spectral Doppler ultrasound were also utilized to evaluate blood flow to  the testicles. COMPARISON:  None Available. FINDINGS: Right testicle Measurements: 2.7 x 2.8 x 2.7 cm. No mass or microlithiasis visualized. Left testicle Measurements: 3.4 x 3 x 2.6 cm. There is 5 mm cyst in the left testis. No other focal abnormalities are seen. Right epididymis:  Normal in size and appearance. Left epididymis:  Normal in size and appearance. Hydrocele:  Small bilateral hydrocele is present. Varicocele:  None visualized. Pulsed Doppler interrogation of both testes demonstrates normal low resistance arterial and venous waveforms bilaterally. IMPRESSION: There is no evidence of testicular torsion. There is 5 mm cyst in the left testis. No other focal abnormalities are seen in both testes. Small bilateral hydrocele. Electronically Signed   By: Elmer Picker M.D.   On: 06/17/2022 10:10    EKG:   Independently reviewed.  Orders placed or performed during the hospital encounter of 06/17/22   ED EKG 12-Lead   ED EKG 12-Lead   EKG 12-Lead   EKG 12-Lead   EKG 12-Lead   ---------------------------------------------------------------------------------------------------------------------------------------    Assessment / Plan:   Principal Problem:   Pulmonary embolism (HCC) Active Problems:   Lobar pneumonia (HCC)   Testicular pain   Diabetes mellitus ([place])   Essential hypertension   Dementia with behavioral disturbance (HCC)   History of CVA (cerebrovascular accident)   Hyperlipidemia   Debility   Acute on chronic urinary retention   Assessment and Plan: * Pulmonary embolism (HCC) Asymptomatic, denies any chest pain or shortness of breath SpO2 94 %.  On 2 L of oxygen -at baseline  -Initially patient was started on heparin drip, switching to p.o. Eliquis  -Will obtain 2D echocardiogram  Testicular pain -Epididymitis versus acute on chronic UTI mentation -With Foley catheter exchanged in ED -Continue to monitor closely -Ultrasound revealed negative for any  testicular torsion MPRESSION::  There is no evidence of testicular torsion. There is 5 mm cyst in the left testis. No other focal abnormalities are seen in both testes. Small bilateral hydrocele.  Lobar pneumonia ([place]) -Ruling out SIRS, sepsis -Was treated for pneumonia and UTI at [facility] Current vitals BP (!) 148/74, pulse 73, Temp. 97.7 F (36.5 C), temperature source Oral, RR (!) 26, weight 73 kg, SpO2 94 %.  On 2 L of oxygen  CBC WBC 9.9,  lactic acid 1.3,  CTA: Abdomen pelvis/chest And pelvis organs within normal limits, Chest small PE peripheral left lower lobe branch artery Multiple ill-defined patchy opacities are noted in both lungs suggesting multifocal pneumonia.  Small left pleural effusion subsegmental atelectasis.  Extensive CAD   Patient started on IV fluids, broad-spectrum antibiotics of Cefepime and doxycycline  Blood and urine cultures have been obtained Will follow accordingly  History of CVA (cerebrovascular accident) - Continue home dose aspirin, statins,  Dementia with behavioral disturbance (HCC) Multifactorial ?  History of Parkinson dementia -progressive neurological disorder, currently severe debility neuropathy  -Home medications reviewed, on Sinemet, Namenda, galantamine   Essential hypertension -Currently stable, continue home medication of Norvasc, lisinopril, metoprolol, Lasix  Diabetes mellitus ([place]) Were holding home medication of metformin -Continue home medication of Levemir -Checking CBG q. ACHS, SSI coverage -Checking A1c  Acute on chronic urinary retention -Possible neurogenic bladder, chronic Foley catheter has been placed previous hospitalization for past 2 months, last changed in January 2024 per patient and his wife -Recent hospitalization at [facility] few days ago Foley catheter was not changed -In ED Foley catheter was exchanged Testicular pain improved  -Continue home medication of Flomax, oxybutynin  Debility -Acute on  chronic severe debility, at baseline ambulates with wheelchair, motorized wheelchair at home -Unknown underlying etiology neuromuscular versus stroke prolonged chronic illness progressing to debility, prolonged hospitalizations associate with neuropathy -Consulting PT/OT -patient recommendation -If patient and wife agrees anticipating patient to be discharged to SNF  Hyperlipidemia - On high-dose statin Lipitor 80 mg daily--due to progressive generalized weakness, will check LFTs, fasting lipid panel, reduce the dose to 40 mg daily     Consults called: Neurology/palliative care -------------------------------------------------------------------------------------------------------------------------------------------- DVT prophylaxis:  TED hose Start: 06/17/22 1441 SCDs Start: 06/17/22 1441 apixaban (ELIQUIS) tablet 10 mg  apixaban (ELIQUIS) tablet 5 mg   Code Status:   Code Status: Full Code   Admission status: Patient will be admitted as Observation, with a greater than 2 midnight length of stay. Level of care: Telemetry   Family Communication:  none at bedside  (The above findings and plan of care has been discussed with patient in detail, the patient expressed understanding and agreement of above plan)  --------------------------------------------------------------------------------------------------------------------------------------------------  Disposition Plan:  Anticipated 1-2 days Status is: Observation The patient remains OBS appropriate and will d/c before 2 midnights.     ----------------------------------------------------------------------------------------------------------------------------------------------------  Time spent: > than  41  Min.  Was spent seeing and evaluating the patient, reviewing all medical records, drawn plan of care.  SIGNED: Deatra James, MD, FHM. FAAFP. [place] [facility], Pager  (Please use [email or web address] to page/ or  secure chat through epic) If 7PM-7AM, please contact night-coverage [email or web address],  06/17/2022, 3:35 PM

## 2022-06-17 NOTE — Consult Note (Signed)
Consultation Note Date: 06/17/2022   Patient Name: Aaron Leon  DOB: [birth year]  MRN: [id number]  Age / Sex: 72 y.o., male  PCP: [facility] Referring Physician: Deatra James, MD  Reason for Consultation: Establishing goals of care  HPI/Patient Profile: 72 y.o. male  with past medical history of COPD, DM, HTN, CVA with cognitive impairment, 2 hospitalizations this month for pneumonia recently admitted 5 days ago for pneumonia and UTI, Foley catheter not changed since late January admitted on 06/17/2022 with testicular pain/UTI.   Clinical Assessment and Goals of Care: I have reviewed medical records including EPIC notes, labs and imaging, received report from RN, assessed the patient.   We meet at the bedside to discuss diagnosis prognosis, GOC, EOL wishes, disposition and options.  I introduced Palliative Medicine as specialized medical care for people living with serious illness. It focuses on providing relief from the symptoms and stress of a serious illness. The goal is to improve quality of life for both the patient and the family.  We discussed a brief life review of the patient.  Aaron Leon have been married 61 years.  They share 1 daughter together and he has 2 additional daughters.  She shares that he fractured his right hip a little over a year ago.  He also fractured his left hip in January of this year.  He has experienced a weight loss of 40 pounds since January.  [facility] is [facility] with [facility].  We then focused on their current illness.  We talk about the treatment plan and time for outcomes.  The natural disease trajectory and expectations at EOL were discussed.  Advanced directives, concepts specific to code status, artifical feeding and hydration, and rehospitalization were considered and discussed.  DNR verified with patient and wife.  Orders  adjusted.  Discussed the importance of continued conversation with family and the medical providers regarding overall plan of care and treatment options, ensuring decisions are within the context of the patient's values and GOCs.  Questions and concerns were addressed.  The patient and family were encouraged to call with questions or concerns.  PMT will continue to support holistically.   Conference with attending, bedside nursing staff, transition of care team related to patient condition, needs, goals of care, disposition.   HCPOA NEXT OF KIN -wife, Aaron Leon.  Aaron Leon married for 36 years.  They share a daughter together and Aaron Leon has 2 other daughters.    SUMMARY OF RECOMMENDATIONS   At this point continue to treat the treatable but no CPR or intubation Time for outcomes Home with home health, active with [facility] for PT and OT Active with [facility]: DNR verified with patient and wife  Symptom Management:  Per hospitalist, no additional needs at this time.  Palliative Prophylaxis:  Frequent Pain Assessment  Additional Recommendations (Limitations, Scope, Preferences): Full Scope Treatment  Psycho-social/Spiritual:  Desire for further Chaplaincy support:no Additional Recommendations: Caregiving  Support/Resources  Prognosis:  Unable to determine, based on outcomes.  Discharge Planning: To Be Determined      Primary Diagnoses: Present on Admission:  Pulmonary embolism ([place])  Essential hypertension  Hyperlipidemia  Debility  Dementia with behavioral disturbance ([place])  Lobar pneumonia ([place])  Acute on chronic urinary retention   I have reviewed the medical record, interviewed the patient and family, and examined the patient. The following aspects are pertinent.  Past Medical History:  Diagnosis Date   COPD (chronic obstructive pulmonary disease) ([place])    Diabetes mellitus without complication ([place])    "type  2"   Hypertension    Stroke [facility])    Social History   Socioeconomic History   Marital status: Single    Spouse name: Not on file   Number of children: Not on file   Years of education: Not on file   Highest education level: Not on file  Occupational History   Not on file  Tobacco Use   Smoking status: Former    Types: E-cigarettes   Smokeless tobacco: Never  Substance and Sexual Activity   Alcohol use: Not Currently   Drug use: Never   Sexual activity: Never  Other Topics Concern   Not on file  Social History Narrative   Not on file   Social Determinants of Health   Financial Resource Strain: Not on file  Food Insecurity: Not on file  Transportation Needs: Not on file  Physical Activity: Not on file  Stress: Not on file  Social Connections: Not on file   No family history on file. Scheduled Meds:  amLODipine  5 mg Oral Daily   apixaban  10 mg Oral BID   Followed by   Derrill Memo ON 06/24/2022] apixaban  5 mg Oral BID   ascorbic acid  500 mg Oral Daily   atorvastatin  40 mg Oral QHS   carbidopa-levodopa  2 tablet Oral TID AC   cholecalciferol  1,000 Units Oral Daily   doxycycline  100 mg Oral Q12H   [START ON 06/18/2022] ferrous sulfate  325 mg Oral q AM   [START ON 06/18/2022] furosemide  20 mg Oral Daily   [START ON 06/18/2022] gabapentin  600 mg Oral BID WC   gabapentin  900 mg Oral QHS   galantamine  24 mg Oral QPM   insulin aspart  0-6 Units Subcutaneous TID WC   insulin detemir  12 Units Subcutaneous q morning   lisinopril  5 mg Oral Daily   loratadine  10 mg Oral Daily   magnesium oxide  400 mg Oral QHS   magnesium oxide  800 mg Oral Daily   melatonin  3 mg Oral QHS   memantine  10 mg Oral BID   metoprolol tartrate  25 mg Oral BID   mometasone-formoterol  2 puff Inhalation BID   oxybutynin  5 mg Oral QHS   [START ON 06/18/2022] sertraline  100 mg Oral q AM   sodium chloride flush  3 mL Intravenous Q12H   tamsulosin  0.4 mg Oral QHS   umeclidinium bromide   1 puff Inhalation Daily   Continuous Infusions:  sodium chloride     ceFEPime (MAXIPIME) IV     magnesium sulfate bolus IVPB     PRN Meds:.acetaminophen **OR** acetaminophen, baclofen, bisacodyl, hydrALAZINE, HYDROmorphone (DILAUDID) injection, ipratropium, levalbuterol, methocarbamol, ondansetron **OR** ondansetron (ZOFRAN) IV, oxyCODONE, senna-docusate, sodium phosphate, traZODone Medications Prior to Admission:  Prior to Admission medications   Medication Sig Start Date End Date  Taking? Authorizing Provider  acetaminophen (TYLENOL) 500 MG tablet Take 500 mg by mouth in the morning and at bedtime.   Yes [provider]  albuterol (PROVENTIL) (2.5 MG/3ML) 0.083% nebulizer solution Take 2.5 mg by nebulization every 6 (six) hours as needed for wheezing or shortness of breath.   Yes [provider]  albuterol (VENTOLIN HFA) 108 (90 Base) MCG/ACT inhaler Inhale 1 puff into the lungs every 6 (six) hours as needed for wheezing or shortness of breath.   Yes [provider]  amLODipine (NORVASC) 5 MG tablet Take 5 mg by mouth daily.   Yes [provider]  ascorbic acid (VITAMIN C) 500 MG tablet Take 500 mg by mouth daily.   Yes [provider]  aspirin 325 MG tablet Take 325 mg by mouth daily.   Yes [provider]  atorvastatin (LIPITOR) 80 MG tablet Take 80 mg by mouth at bedtime.    Yes [provider]  baclofen (LIORESAL) 10 MG tablet Take 10-20 mg by mouth 2 (two) times daily.   Yes [provider]  budesonide-formoterol (SYMBICORT) 80-4.5 MCG/ACT inhaler Inhale 2 puffs into the lungs 2 (two) times daily.   Yes [provider]  carbidopa-levodopa (SINEMET CR) 50-200 MG tablet Take 1 tablet by mouth at bedtime. 04/20/22  Yes [provider]  carbidopa-levodopa (SINEMET IR) 25-100 MG tablet Take 2 tablets by mouth 3 (three) times daily. 02/20/21  Yes [provider]  cetirizine (ZYRTEC) 10 MG tablet  Take 10 mg by mouth in the morning.    Yes [provider]  cholecalciferol (VITAMIN D3) 25 MCG (1000 UNIT) tablet Take 1,000 Units by mouth in the morning and at bedtime.   Yes [provider]  ferrous sulfate 325 (65 FE) MG EC tablet Take 325 mg by mouth in the morning.    Yes [provider]  furosemide (LASIX) 20 MG tablet Take 5 mg by mouth daily.   Yes [provider]  gabapentin (NEURONTIN) 300 MG capsule Take 600-900 mg by mouth See admin instructions. 600mg  in the morning and at noon, then take 900mg  in the evening   Yes [provider]  galantamine (RAZADYNE ER) 24 MG 24 hr capsule Take 24 mg by mouth every evening. 02/20/21  Yes [provider]  insulin aspart (NOVOLOG) 100 UNIT/ML injection Inject 1-2 Units into the skin 3 (three) times daily before meals. Per sliding scale   Yes [provider]  insulin detemir (LEVEMIR) 100 UNIT/ML injection Inject 12 Units into the skin every morning.   Yes [provider]  lisinopril (ZESTRIL) 10 MG tablet Take 5 mg by mouth daily.   Yes [provider]  magnesium oxide (MAG-OX) 400 (240 Mg) MG tablet Take 400-800 mg by mouth daily. 2 tablets in the morning and 1 tablet in the evening   Yes [provider]  melatonin 3 MG TABS tablet Take 3 mg by mouth at bedtime.   Yes [provider]  memantine (NAMENDA) 10 MG tablet Take 10 mg by mouth 2 (two) times daily.   Yes [provider]  metFORMIN (GLUCOPHAGE) 1000 MG tablet Take 1,000 mg by mouth 2 (two) times daily with a meal.   Yes [provider]  methocarbamol (ROBAXIN) 500 MG tablet Take 500 mg by mouth 4 (four) times daily.   Yes [provider]  metoprolol tartrate (LOPRESSOR) 25 MG tablet Take 25 mg by mouth 2 (two) times daily.   Yes [provider]  omeprazole (PRILOSEC) 20 MG capsule Take 20 mg by mouth in the morning.    Yes [provider]  oxybutynin  (DITROPAN) 5 MG tablet Take 5 mg by mouth at bedtime.    Yes [provider]  oxyCODONE-acetaminophen (PERCOCET) 10-325 MG tablet Take 1 tablet by mouth every 6 (six) hours as needed for pain.   Yes [provider]  polyethylene glycol powder (GLYCOLAX/MIRALAX) 17 GM/SCOOP powder Take 17 g by mouth daily as needed for mild constipation or moderate constipation.   Yes [provider]  sertraline (ZOLOFT) 100 MG tablet Take 100 mg by mouth in the morning.    Yes [provider]  tamsulosin (FLOMAX) 0.4 MG CAPS capsule Take 0.4 mg by mouth at bedtime.   Yes [provider]  tiotropium (SPIRIVA) 18 MCG inhalation capsule Place 18 mcg into inhaler and inhale in the morning.    Yes [provider]  zolpidem (AMBIEN) 10 MG tablet Take 10 mg by mouth at bedtime.   Yes [provider]   Allergies  Allergen Reactions   Barbiturates Other (See Comments)    Blacks out Blacks out    Seroquel  [Quetiapine]     Other reaction(s): Hallucination   Shellfish Allergy Hives   Review of Systems  Unable to perform ROS: Other    Physical Exam Vitals and nursing note reviewed.  Constitutional:      General: He is not in acute distress.    Appearance: He is ill-appearing.  HENT:     Mouth/Throat:     Mouth: Mucous membranes are dry.  Cardiovascular:     Rate and Rhythm: Normal rate.  Pulmonary:     Effort: Pulmonary effort is normal. No respiratory distress.  Abdominal:     General: Abdomen is flat.     Tenderness: There is no abdominal tenderness.  Skin:    General: Skin is warm and dry.  Neurological:     General: No focal deficit present.     Mental Status: He is alert. Mental status is at baseline.  Psychiatric:        Mood and Affect: Mood normal.        Behavior: Behavior normal.     Vital Signs: BP (!) 154/69   Pulse 73   Temp 97.7 F (36.5 C) (Oral)   Resp 12   Wt 73 kg   SpO2 95%   BMI 21.83 kg/m  Pain Scale:  0-10   Pain Score: 10-Worst pain ever   SpO2: SpO2: 95 % O2 Device:SpO2: 95 % O2 Flow Rate: .O2 Flow Rate (L/min): 2 L/min  IO: Intake/output summary: No intake or output data in the 24 hours ending 06/17/22 1546  LBM:   Baseline Weight: Weight: 73 kg Most recent weight: Weight: 73 kg     Palliative Assessment/Data:     Time In: 1505 Time Out: 1600 Time Total: 55 minutes Greater than 50%  of this time was spent counseling and coordinating care related to the above assessment and plan.  Signed by: Drue Novel, NP   Please contact Palliative Medicine Team phone at [phone] for questions and concerns.  For individual provider: See Shea Evans

## 2022-06-17 NOTE — Progress Notes (Signed)
Pharmacy Antibiotic Note  Aaron Leon is a 72 y.o. male admitted on 06/17/2022 with pneumonia and UTI.  Pharmacy has been consulted for cefepime dosing.  Plan: Cefepime 2000 mg IV every 8 hours. Monitor labs, c/s, and patient improvement.  Weight: 73 kg (160 lb 15 oz)  Temp (24hrs), Avg:98.2 F (36.8 C), Min:97.7 F (36.5 C), Max:98.7 F (37.1 C)  Recent Labs  Lab 06/17/22 0901  WBC 9.9  CREATININE 0.58*  LATICACIDVEN 1.3    CrCl cannot be calculated (Unknown ideal weight.).    Allergies  Allergen Reactions   Barbiturates Other (See Comments)    Blacks out Blacks out    Seroquel  [Quetiapine]     Other reaction(s): Hallucination   Shellfish Allergy Hives    Antimicrobials this admission: Cefepime 3/25 >>  Microbiology results: 3/25 BCx: pending 3/25 UCx: pending    Thank you for allowing pharmacy to be a part of this patient's care.  Margot Ables, PharmD Clinical Pharmacist 06/17/2022 12:39 PM

## 2022-06-17 NOTE — ED Triage Notes (Addendum)
Pt BIB [facility] c/o genital  pain, pt states "it feels like someone is sitting on my testicles". Pt thinks its time for his catheter to be changed, he believes it has been in for 16+ weeks.

## 2022-06-18 ENCOUNTER — Observation Stay (HOSPITAL_COMMUNITY): Payer: No Typology Code available for payment source

## 2022-06-18 ENCOUNTER — Other Ambulatory Visit (HOSPITAL_COMMUNITY): Payer: Self-pay

## 2022-06-18 ENCOUNTER — Encounter (HOSPITAL_COMMUNITY): Payer: Self-pay | Admitting: Family Medicine

## 2022-06-18 DIAGNOSIS — N3289 Other specified disorders of bladder: Secondary | ICD-10-CM

## 2022-06-18 DIAGNOSIS — I2699 Other pulmonary embolism without acute cor pulmonale: Secondary | ICD-10-CM | POA: Diagnosis not present

## 2022-06-18 DIAGNOSIS — R339 Retention of urine, unspecified: Secondary | ICD-10-CM

## 2022-06-18 DIAGNOSIS — R0609 Other forms of dyspnea: Secondary | ICD-10-CM

## 2022-06-18 LAB — CBC
HCT: 31.6 % — ABNORMAL LOW (ref 39.0–52.0)
Hemoglobin: 9.7 g/dL — ABNORMAL LOW (ref 13.0–17.0)
MCH: 27.4 pg (ref 26.0–34.0)
MCHC: 30.7 g/dL (ref 30.0–36.0)
MCV: 89.3 fL (ref 80.0–100.0)
Platelets: 395 10*3/uL (ref 150–400)
RBC: 3.54 MIL/uL — ABNORMAL LOW (ref 4.22–5.81)
RDW: 15.1 % (ref 11.5–15.5)
WBC: 8.3 10*3/uL (ref 4.0–10.5)
nRBC: 0 % (ref 0.0–0.2)

## 2022-06-18 LAB — COMPREHENSIVE METABOLIC PANEL
ALT: <5 U/L (ref 0–44)
AST: 12 U/L — ABNORMAL LOW (ref 15–41)
Albumin: 2.3 g/dL — ABNORMAL LOW (ref 3.5–5.0)
Alkaline Phosphatase: 83 U/L (ref 38–126)
Anion gap: 8 (ref 5–15)
BUN: 12 mg/dL (ref 8–23)
CO2: 27 mmol/L (ref 22–32)
Calcium: 8.1 mg/dL — ABNORMAL LOW (ref 8.9–10.3)
Chloride: 103 mmol/L (ref 98–111)
Creatinine, Ser: 0.66 mg/dL (ref 0.61–1.24)
GFR, Estimated: >60 mL/min (ref >60)
Glucose, Bld: 152 mg/dL — ABNORMAL HIGH (ref 70–99)
Potassium: 4.2 mmol/L (ref 3.5–5.1)
Sodium: 138 mmol/L (ref 135–145)
Total Bilirubin: 0.4 mg/dL (ref 0.3–1.2)
Total Protein: 5.5 g/dL — ABNORMAL LOW (ref 6.5–8.1)

## 2022-06-18 LAB — ECHOCARDIOGRAM COMPLETE
Area-P 1/2: 2.32 cm2
P 1/2 time: 804 msec
S' Lateral: 3.1 cm
Weight: 2486.79 oz

## 2022-06-18 LAB — URINE CULTURE: Culture: NO GROWTH

## 2022-06-18 LAB — HEMOGLOBIN A1C
Hgb A1c MFr Bld: 7.2 % — ABNORMAL HIGH (ref 4.8–5.6)
Mean Plasma Glucose: 160 mg/dL

## 2022-06-18 LAB — APTT: aPTT: 31 seconds (ref 24–36)

## 2022-06-18 LAB — GLUCOSE, CAPILLARY
Glucose-Capillary: 142 mg/dL — ABNORMAL HIGH (ref 70–99)
Glucose-Capillary: 236 mg/dL — ABNORMAL HIGH (ref 70–99)
Glucose-Capillary: 188 mg/dL — ABNORMAL HIGH (ref 70–99)
Glucose-Capillary: 204 mg/dL — ABNORMAL HIGH (ref 70–99)

## 2022-06-18 LAB — PROTIME-INR
INR: 1.1 (ref 0.8–1.2)
Prothrombin Time: 14.3 seconds (ref 11.4–15.2)

## 2022-06-18 MED ORDER — LEVOFLOXACIN 500 MG PO TABS
750.0000 mg | ORAL_TABLET | Freq: Every day | ORAL | Status: DC
  Administered 2022-06-18: 750 mg via ORAL
  Filled 2022-06-18: qty 2

## 2022-06-18 MED ORDER — CALCIUM CARBONATE ANTACID 500 MG PO CHEW
1.0000 | CHEWABLE_TABLET | Freq: Once | ORAL | Status: AC
  Administered 2022-06-18: 200 mg via ORAL
  Filled 2022-06-18: qty 1

## 2022-06-18 MED ORDER — FUROSEMIDE 20 MG PO TABS
20.0000 mg | ORAL_TABLET | Freq: Every day | ORAL | Status: DC
  Administered 2022-06-19: 20 mg via ORAL
  Filled 2022-06-18: qty 1

## 2022-06-18 MED ORDER — MIRABEGRON ER 25 MG PO TB24
25.0000 mg | ORAL_TABLET | Freq: Every day | ORAL | Status: DC
  Administered 2022-06-18 – 2022-06-19 (×2): 25 mg via ORAL
  Filled 2022-06-18 (×2): qty 1

## 2022-06-18 MED ORDER — FLORANEX PO PACK
1.0000 g | PACK | Freq: Three times a day (TID) | ORAL | Status: DC
  Administered 2022-06-18 – 2022-06-19 (×3): 1 g via ORAL
  Filled 2022-06-18 (×3): qty 1

## 2022-06-18 MED ORDER — CHLORHEXIDINE GLUCONATE CLOTH 2 % EX PADS
6.0000 | MEDICATED_PAD | Freq: Every day | CUTANEOUS | Status: DC
  Administered 2022-06-19: 6 via TOPICAL

## 2022-06-18 MED ORDER — HYOSCYAMINE SULFATE 0.125 MG SL SUBL
0.1250 mg | SUBLINGUAL_TABLET | SUBLINGUAL | Status: DC | PRN
  Administered 2022-06-18: 0.125 mg via SUBLINGUAL
  Filled 2022-06-18: qty 1

## 2022-06-18 NOTE — Progress Notes (Signed)
Pt inpt

## 2022-06-18 NOTE — TOC Initial Note (Addendum)
Transition of Care [facility]) - Initial/Assessment Note    Patient Details  Name: Aaron Leon MRN: [id number] Date of Birth: 04-26-1950  Transition of [facility]) CM/SW Contact:    Iona Beard, [place] Phone Number: 06/18/2022, 11:28 AM  Clinical Narrative:                 [facility] consulted for home needs. CSW spoke with pts wife to complete assessment. Pt uses an electric wheelchair at baseline. Pt is active with [facility] for PT/OT/RN services. [facility]. TOC to follow.   Expected Discharge Plan: [place] Barriers to Discharge: Continued Medical Work up   Patient Goals and CMS Choice Patient states their goals for this hospitalization and ongoing recovery are:: return home CMS Medicare.gov Compare Post Acute Care list provided to:: Patient Represenative (must comment) Choice offered to / list presented to : Spouse      Expected Discharge Plan and Services In-house Referral: Clinical Social Work Discharge Planning Services: CM Consult Post Acute Care Choice: [place] arrangements for the past 2 months: [place]                                      Prior Living Arrangements/Services Living arrangements for the past 2 months: Single Family Home Lives with:: Spouse Patient language and need for interpreter reviewed:: Yes Do you feel safe going back to the place where you live?: Yes      Need for Family Participation in Patient Care: Yes (Comment) Care giver support system in place?: Yes (comment) Current home services: DME, Home OT, Home PT, Home RN Criminal Activity/Legal Involvement Pertinent to Current Situation/Hospitalization: No - Comment as needed  Activities of Daily Living Home Assistive Devices/Equipment: Electric scooter, Dentures (specify type), Bedside commode/3-in-1, Walker (specify type), [employer], Grab bars around toilet, Grab bars in shower, CBG Meter, Shower chair with back,  Oxygen ADL Screening (condition at time of admission) Patient's cognitive ability adequate to safely complete daily activities?: No Is the patient deaf or have difficulty hearing?: Yes Does the patient have difficulty seeing, even when wearing glasses/contacts?: No Does the patient have difficulty concentrating, remembering, or making decisions?: Yes Patient able to express need for assistance with ADLs?: Yes Does the patient have difficulty dressing or bathing?: Yes Independently performs ADLs?: No Communication: Independent Dressing (OT): Needs assistance Is this a change from baseline?: Pre-admission baseline Grooming: Independent Feeding: Independent Bathing: Needs assistance Is this a change from baseline?: Pre-admission baseline Toileting: Needs assistance Is this a change from baseline?: Pre-admission baseline In/Out Bed: Dependent Is this a change from baseline?: Pre-admission baseline Walks in Home: Dependent Is this a change from baseline?: Pre-admission baseline Does the patient have difficulty walking or climbing stairs?: Yes Weakness of Legs: Both Weakness of Arms/Hands: Both  Permission Sought/Granted                  Emotional Assessment Appearance:: Appears stated age Attitude/Demeanor/Rapport: Engaged Affect (typically observed): Accepting Orientation: : Oriented to Self, Oriented to Place, Oriented to  Time, Oriented to Situation Alcohol / Substance Use: Not Applicable Psych Involvement: No (comment)  Admission diagnosis:  Pulmonary embolism (HCC) [I26.99] Urinary tract infection with hematuria, site unspecified [N39.0, R31.9] Multifocal pneumonia [J18.9] Single subsegmental pulmonary embolism without acute cor pulmonale (HCC) [I26.93] Patient Active Problem List   Diagnosis Date Noted   Pulmonary embolism (  [place]) 06/17/2022   Diabetes mellitus ([place]) 06/17/2022   Essential hypertension 06/17/2022   Hyperlipidemia 06/17/2022   Debility 06/17/2022    Dementia with behavioral disturbance ([place]) 06/17/2022    Class: Chronic   Lobar pneumonia ([place]) 06/17/2022   Acute on chronic urinary retention 06/17/2022   Testicular pain 06/17/2022   History of CVA (cerebrovascular accident) 06/17/2022   PCP:  Clinic, [place]:   [id number] [place], [place] [address] [place] [address] Phone: [phone] Fax: [phone]     Social Determinants of Health (SDOH) Social History: SDOH Screenings   Food Insecurity: No Food Insecurity (06/17/2022)  Housing: Low Risk  (06/17/2022)  Transportation Needs: No Transportation Needs (06/17/2022)  Utilities: Not At Risk (06/18/2022)  Tobacco Use: Medium Risk (06/18/2022)   SDOH Interventions:     Readmission Risk Interventions     No data to display

## 2022-06-18 NOTE — Evaluation (Signed)
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  After first admission 2 months ago was sent home with supplemental oxygen.  Was treated at [facility] facility for UTI and pneumonia 5 days ago.[name]  History of chronic Foley catheter placed 2 months -was last changed in January 2024. Was not changed on last hospitalization.  Patient was discharged on Augmentin and Levaquin 2 days ago.     Presenting today with testicular pain.  Patient and his wife his symptoms started last night-bilateral testicular pain, wife noted of sediment in his Foley catheter.     Wife reports he does not look well.  He seen weak overall, poor appetite, with poor p.o. intake solids and liquids....  Reporting he is not ambulatory at baseline -previous history of hip fractures, deconditioning multiple hospitalizations pivots at baseline, uses wheelchair.  Has motorized wheelchair at home. (per MD)   Clinical Impression   Pt agreeable to OT and PT co-evaluation. Pt demonstrates need for much time when completing bed mobility and tranfers. Much labored movement and increase in back pain. Wife reportedly has good set up at home and is able to care for pt with much assist at baseline. Today pt required mod A for bed mobility and max A for squat pivot to the chair. Pt is assisted for most ADL's at most recent baseline. Pt is generally weak with very labored movement and increased pain with movement. Pt will benefit from continued OT in the hospital and recommended venue below to increase strength, balance, and endurance for safe ADL's.        Recommendations for follow up therapy are one  component of a multi-disciplinary discharge planning process, led by the attending physician.  Recommendations may be updated based on patient status, additional functional criteria and insurance authorization.   Assistance Recommended at Discharge Frequent or constant Supervision/Assistance  Patient can return home with the following A lot of help with walking and/or transfers;A lot of help with bathing/dressing/bathroom;Assistance with cooking/housework;Assist for transportation;Help with stairs or ramp for entrance    Functional Status Assessment  Patient has had a recent decline in their functional status and demonstrates the ability to make significant improvements in function in a reasonable and predictable amount of time.  Equipment Recommendations  None recommended by OT    Recommendations for Other Services       Precautions / Restrictions Precautions Precautions: Fall Restrictions Weight Bearing Restrictions: No      Mobility Bed Mobility Overal bed mobility: Needs Assistance Bed Mobility: Supine to Sit     Supine to sit: HOB elevated, Mod assist     General bed mobility comments: Very slow labored movement with need to raise head of bed and use assist to move B LE and pull to sit.    Transfers Overall transfer level: Needs assistance   Transfers: Bed to chair/wheelchair/BSC     Squat pivot transfers: Max assist       General transfer comment: Squat pivot to chair from EOB with PT assisting.      Balance Overall balance assessment: Needs assistance Sitting-balance support: Bilateral upper extremity supported, Feet supported Sitting balance-Leahy Scale: Fair Sitting balance - Comments: seated at EOB  ADL either performed or assessed with clinical judgement   ADL Overall ADL's : Needs assistance/impaired Eating/Feeding: Supervision/ safety;Modified independent;Sitting   Grooming: Min  guard;Supervision/safety;Sitting   Upper Body Bathing: Minimal assistance;Sitting;Moderate assistance   Lower Body Bathing: Maximal assistance;Sitting/lateral leans   Upper Body Dressing : Minimal assistance;Sitting;Moderate assistance   Lower Body Dressing: Maximal assistance;Bed level Lower Body Dressing Details (indicate cue type and reason): Assit to pull R sock up fully prior to transfer. Toilet Transfer: Maximal Public house manager Details (indicate cue type and reason): Simulated via EOB to chair with assist. Toileting- Clothing Manipulation and Hygiene: Maximal assistance;Total assistance;Bed level               Vision Baseline Vision/History: 0 No visual deficits Ability to See in Adequate Light: 0 Adequate Patient Visual Report: No change from baseline Vision Assessment?: No apparent visual deficits                Pertinent Vitals/Pain Pain Assessment Pain Assessment: Faces Faces Pain Scale: Hurts whole lot Pain Location: back Pain Descriptors / Indicators: Grimacing, Guarding, Moaning Pain Intervention(s): Limited activity within patient's tolerance, Monitored during session, Repositioned     Hand Dominance Right   Extremity/Trunk Assessment Upper Extremity Assessment Upper Extremity Assessment: Generalized weakness   Lower Extremity Assessment Lower Extremity Assessment: Defer to PT evaluation   Cervical / Trunk Assessment Cervical / Trunk Assessment: Kyphotic   Communication Communication Communication: HOH   Cognition Arousal/Alertness: Awake/alert Behavior During Therapy: WFL for tasks assessed/performed Overall Cognitive Status: Within Functional Limits for tasks assessed                                                        Home Living Family/patient expects to be discharged to:: Private residence Living Arrangements: Spouse/significant other Available Help at Discharge: Family;Available 24  hours/day;Personal care attendant Type of Home: Mobile home Home Access: Ramped entrance     Home Layout: One level     Bathroom Shower/Tub: Tub/shower unit;Walk-in shower   Bathroom Toilet: Handicapped height Bathroom Accessibility: Yes How Accessible: Accessible via walker Home Equipment: [place] (2 wheels);BSC/3in1;Tub bench;Wheelchair - power;Hospital bed;Other (comment) (Hydaulic lift)   Additional Comments: Aides avaialble 5x a week when wife is away.      Prior Functioning/Environment Prior Level of Function : Needs assist       Physical Assist : Mobility (physical);ADLs (physical) Mobility (physical): Bed mobility;Transfers;Gait ADLs (physical): Grooming;Bathing;Dressing;Toileting;IADLs Mobility Comments: Pivot transfers to chair with assist and PRN use of RW; uses power chair for mobility. ADLs Comments: Assisted for bathing, dressing, toileting, grooming, and IADL's.        OT Problem List: Decreased strength;Decreased range of motion;Decreased activity tolerance;Impaired balance (sitting and/or standing);Pain      OT Treatment/Interventions: Self-care/ADL training;Therapeutic exercise;Therapeutic activities;Patient/family education;Balance training;Energy conservation    OT Goals(Current goals can be found in the care plan section) Acute Rehab OT Goals Patient Stated Goal: Return home OT Goal Formulation: With patient/family Time For Goal Achievement: 07/02/22 Potential to Achieve Goals: Good  OT Frequency: Min 2X/week    Co-evaluation PT/OT/SLP Co-Evaluation/Treatment: Yes Reason for Co-Treatment: To address functional/ADL transfers   OT goals addressed during session: ADL's and self-care      AM-PAC OT "6 Clicks" Daily Activity     Outcome Measure Help from another person eating meals?: A Little Help  from another person taking care of personal grooming?: A Little Help from another person toileting, which includes using toliet, bedpan, or  urinal?: Total Help from another person bathing (including washing, rinsing, drying)?: A Lot Help from another person to put on and taking off regular upper body clothing?: A Lot Help from another person to put on and taking off regular lower body clothing?: Total 6 Click Score: 12   End of Session Equipment Utilized During Treatment: Oxygen  Activity Tolerance: Patient limited by pain Patient left: in chair;with call bell/phone within reach  OT Visit Diagnosis: Unsteadiness on feet (R26.81);Other abnormalities of gait and mobility (R26.89);Muscle weakness (generalized) (M62.81);History of falling (Z91.81)                Time: [id number] OT Time Calculation (min): 25 min Charges:  OT General Charges $OT Visit: 1 Visit OT Evaluation $OT Eval Low Complexity: 1 Low  Mehgan Santmyer OT, MOT  Larey Seat 06/18/2022, 9:33 AM

## 2022-06-18 NOTE — Progress Notes (Signed)
Pt receiving pt care, will attempt later.  

## 2022-06-18 NOTE — Progress Notes (Signed)
  Echocardiogram 2D Echocardiogram has been performed.  Aaron Leon 06/18/2022, 11:17 AM

## 2022-06-18 NOTE — Consult Note (Signed)
Urology Consult  Referring physician: Dr. Roger Shelter Reason for referral: urinary retention, scrotal cyst  Chief Complaint: lower back pain  History of Present Illness: Mr Tutwiler is a 72yo with a history of COPD, DMII, HTN, dementia who was admitted with pneumonia. Per his family he has had a chronic indwelling foley since January 2024 after a hip fracture. Since he has decreased mobility the decision was made to keep the foley. It was last changed in February 2024. Since his hip fracture he has had moderate lower back pain that is sharp, intermittent and nonradiating. No exacerbating and no alleviating events. He denies nay gross hematuria. He denies any history of UTI. He denies nay difficulty urinating prior to his hip fracture. He underwent CT and scrotal US on admission. 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   Past Medical History:  Diagnosis Date   COPD (chronic obstructive pulmonary disease) (HCC)    Diabetes mellitus without complication ([place])    "type 2"   Hypertension    Stroke [facility])    Past Surgical History:  Procedure Laterality Date   BACK SURGERY     CATARACT EXTRACTION     CERVICAL FUSION     KNEE SURGERY      Medications: I have reviewed the patient's current medications. Allergies:  Allergies  Allergen Reactions   Barbiturates Other (See Comments)    Blacks out Blacks out    Seroquel  [Quetiapine]     Other reaction(s): Hallucination   Shellfish Allergy Hives    History reviewed. No pertinent family history. Social History:  reports that he has quit smoking. His smoking use included e-cigarettes. He has never used smokeless tobacco. He reports that he does not currently use alcohol. He reports that he does not use drugs.  Review of Systems  Respiratory:  Positive for shortness of breath.   Genitourinary:  Positive for urgency.  All other systems reviewed and are negative.   Physical Exam:  Vital signs in last 24  hours: Temp:  [97.7 F (36.5 C)-98.1 F (36.7 C)] 98 F (36.7 C) (03/26 0527) Pulse Rate:  [59-73] 62 (03/26 1038) Resp:  [10-26] 18 (03/26 1038) BP: (100-154)/(48-74) 107/48 (03/26 1038) SpO2:  [88 %-95 %] 92 % (03/26 1038) Weight:  [70.5 kg] 70.5 kg (03/26 0527) Physical Exam Vitals reviewed.  Constitutional:      Appearance: Normal appearance.  HENT:     Head: Normocephalic and atraumatic.     Nose: Nose normal. No congestion.     Mouth/Throat:     Mouth: Mucous membranes are dry.  Eyes:     Extraocular Movements: Extraocular movements intact.     Pupils: Pupils are equal, round, and reactive to light.  Cardiovascular:     Rate and Rhythm: Normal rate and regular rhythm.  Pulmonary:     Effort: Pulmonary effort is normal. No respiratory distress.  Abdominal:     General: Abdomen is flat. There is no distension.  Genitourinary:    Penis: Normal.      Testes: Normal.  Musculoskeletal:     Cervical back: Normal range of motion and neck supple.  Skin:    General: Skin is warm and dry.  Neurological:     General: No focal deficit present.     Mental Status: He is alert.  Psychiatric:        Mood and Affect: Mood normal.        Thought Content: Thought content normal.  Judgment: Judgment normal.     Laboratory Data:  Results for orders placed or performed during the hospital encounter of 06/17/22 (from the past 72 hour(s))  Resp panel by RT-PCR (RSV, Flu A&B, Covid) Anterior Nasal Swab     Status: None   Collection Time: 06/17/22  8:57 AM   Specimen: Anterior Nasal Swab  Result Value Ref Range   SARS Coronavirus 2 by RT PCR NEGATIVE NEGATIVE    Comment: (NOTE) SARS-CoV-2 target nucleic acids are NOT DETECTED.  The SARS-CoV-2 RNA is generally detectable in upper respiratory specimens during the acute phase of infection. The lowest concentration of SARS-CoV-2 viral copies this assay can detect is 138 copies/mL. A negative result does not preclude  SARS-Cov-2 infection and should not be used as the sole basis for treatment or other patient management decisions. A negative result may occur with  improper specimen collection/handling, submission of specimen other than nasopharyngeal swab, presence of viral mutation(s) within the areas targeted by this assay, and inadequate number of viral copies(<138 copies/mL). A negative result must be combined with clinical observations, patient history, and epidemiological information. The expected result is Negative.  Fact Sheet for Patients:  EntrepreneurPulse.com.au  Fact Sheet for Healthcare Providers:  IncredibleEmployment.be  This test is no t yet approved or cleared by the [place] FDA and  has been authorized for detection and/or diagnosis of SARS-CoV-2 by FDA under an Emergency Use Authorization (EUA). This EUA will remain  in effect (meaning this test can be used) for the duration of the COVID-19 declaration under Section 564(b)(1) of the Act, 21 U.S.C.section 360bbb-3(b)(1), unless the authorization is terminated  or revoked sooner.       Influenza A by PCR NEGATIVE NEGATIVE   Influenza B by PCR NEGATIVE NEGATIVE    Comment: (NOTE) The Xpert Xpress SARS-CoV-2/FLU/RSV plus assay is intended as an aid in the diagnosis of influenza from Nasopharyngeal swab specimens and should not be used as a sole basis for treatment. Nasal washings and aspirates are unacceptable for Xpert Xpress SARS-CoV-2/FLU/RSV testing.  Fact Sheet for Patients: EntrepreneurPulse.com.au  Fact Sheet for Healthcare Providers: IncredibleEmployment.be  This test is not yet approved or cleared by the [place] FDA and has been authorized for detection and/or diagnosis of SARS-CoV-2 by FDA under an Emergency Use Authorization (EUA). This EUA will remain in effect (meaning this test can be used) for the duration of the COVID-19  declaration under Section 564(b)(1) of the Act, 21 U.S.C. section 360bbb-3(b)(1), unless the authorization is terminated or revoked.     Resp Syncytial Virus by PCR NEGATIVE NEGATIVE    Comment: (NOTE) Fact Sheet for Patients: EntrepreneurPulse.com.au  Fact Sheet for Healthcare Providers: IncredibleEmployment.be  This test is not yet approved or cleared by the [place] FDA and has been authorized for detection and/or diagnosis of SARS-CoV-2 by FDA under an Emergency Use Authorization (EUA). This EUA will remain in effect (meaning this test can be used) for the duration of the COVID-19 declaration under Section 564(b)(1) of the Act, 21 U.S.C. section 360bbb-3(b)(1), unless the authorization is terminated or revoked.  Performed at [facility], [address], [place], [place] [address]   Lactic acid, plasma     Status: None   Collection Time: 06/17/22  9:01 AM  Result Value Ref Range   Lactic Acid, Venous 1.3 0.5 - 1.9 mmol/L    Comment: Performed at [facility], [address], [place], [place] [address]  Comprehensive metabolic panel     Status: Abnormal  Collection Time: 06/17/22  9:01 AM  Result Value Ref Range   Sodium 139 135 - 145 mmol/L   Potassium 3.9 3.5 - 5.1 mmol/L   Chloride 103 98 - 111 mmol/L   CO2 26 22 - 32 mmol/L   Glucose, Bld 165 (H) 70 - 99 mg/dL    Comment: Glucose reference range applies only to samples taken after fasting for at least 8 hours.   BUN 10 8 - 23 mg/dL   Creatinine, Ser 0.58 (L) 0.61 - 1.24 mg/dL   Calcium 8.2 (L) 8.9 - 10.3 mg/dL   Total Protein 6.1 (L) 6.5 - 8.1 g/dL   Albumin 2.6 (L) 3.5 - 5.0 g/dL   AST 12 (L) 15 - 41 U/L   ALT 8 0 - 44 U/L   Alkaline Phosphatase 86 38 - 126 U/L   Total Bilirubin 0.5 0.3 - 1.2 mg/dL   GFR, Estimated >60 >60 mL/min    Comment: (NOTE) Calculated using the CKD-EPI Creatinine Equation (2021)    Anion gap 10 5 - 15    Comment: Performed at [facility], [address] [place], [place] [address]  CBC with Differential     Status: Abnormal   Collection Time: 06/17/22  9:01 AM  Result Value Ref Range   WBC 9.9 4.0 - 10.5 K/uL   RBC 3.82 (L) 4.22 - 5.81 MIL/uL   Hemoglobin 10.4 (L) 13.0 - 17.0 g/dL   HCT 33.2 (L) 39.0 - 52.0 %   MCV 86.9 80.0 - 100.0 fL   MCH 27.2 26.0 - 34.0 pg   MCHC 31.3 30.0 - 36.0 g/dL   RDW 14.6 11.5 - 15.5 %   Platelets 411 (H) 150 - 400 K/uL   nRBC 0.0 0.0 - 0.2 %   Neutrophils Relative % 80 %   Neutro Abs 8.0 (H) 1.7 - 7.7 K/uL   Lymphocytes Relative 13 %   Lymphs Abs 1.3 0.7 - 4.0 K/uL   Monocytes Relative 4 %   Monocytes Absolute 0.4 0.1 - 1.0 K/uL   Eosinophils Relative 2 %   Eosinophils Absolute 0.2 0.0 - 0.5 K/uL   Basophils Relative 0 %   Basophils Absolute 0.0 0.0 - 0.1 K/uL   Immature Granulocytes 1 %   Abs Immature Granulocytes 0.07 0.00 - 0.07 K/uL    Comment: Performed at [facility], [address] [place], [place] [address]  Blood Culture (routine x 2)     Status: None (Preliminary result)   Collection Time: 06/17/22  9:01 AM   Specimen: BLOOD LEFT HAND  Result Value Ref Range   Specimen Description BLOOD LEFT HAND    Special Requests      BOTTLES DRAWN AEROBIC AND ANAEROBIC Blood Culture adequate volume   Culture      NO GROWTH < 24 HOURS Performed at [facility], [address] [place], [place] [address]    Report Status PENDING   Troponin I (High Sensitivity)     Status: None   Collection Time: 06/17/22  9:01 AM  Result Value Ref Range   Troponin I (High Sensitivity) 3 <18 ng/L    Comment: (NOTE) Elevated high sensitivity troponin I (hsTnI) values and significant  changes across serial measurements may suggest ACS but many other  chronic and acute conditions are known to elevate hsTnI results.  Refer to the "Links" section for chest pain algorithms and additional  guidance. Performed at [facility], [address] [place], [place] [address]   Magnesium  Status:  Abnormal   Collection Time: 06/17/22  9:01 AM  Result Value Ref Range   Magnesium 1.4 (L) 1.7 - 2.4 mg/dL    Comment: Performed at [facility], [address] [place], [place] [address]  Phosphorus     Status: None   Collection Time: 06/17/22  9:01 AM  Result Value Ref Range   Phosphorus 3.0 2.5 - 4.6 mg/dL    Comment: Performed at [facility], [address], [place], [place] [address]  Hemoglobin A1c     Status: Abnormal   Collection Time: 06/17/22  9:01 AM  Result Value Ref Range   Hgb A1c MFr Bld 7.2 (H) 4.8 - 5.6 %    Comment: (NOTE)         Prediabetes: 5.7 - 6.4         Diabetes: >6.4         Glycemic control for adults with diabetes: <7.0    Mean Plasma Glucose 160 mg/dL    Comment: (NOTE) Performed At: [facility] [address] [place], [place] [id number] Rush Farmer MD [id number]   Urinalysis, w/ Reflex to Culture (Infection Suspected) -Urine, Catheterized     Status: Abnormal   Collection Time: 06/17/22  9:15 AM  Result Value Ref Range   Specimen Source URINE, CATHETERIZED    Color, Urine YELLOW YELLOW   APPearance CLOUDY (A) CLEAR   Specific Gravity, Urine 1.015 1.005 - 1.030   pH 5.0 5.0 - 8.0   Glucose, UA NEGATIVE NEGATIVE mg/dL   Hgb urine dipstick LARGE (A) NEGATIVE   Bilirubin Urine NEGATIVE NEGATIVE   Ketones, ur NEGATIVE NEGATIVE mg/dL   Protein, ur 100 (A) NEGATIVE mg/dL   Nitrite NEGATIVE NEGATIVE   Leukocytes,Ua MODERATE (A) NEGATIVE   RBC / HPF >50 0 - 5 RBC/hpf   WBC, UA >50 0 - 5 WBC/hpf    Comment:        Reflex urine culture not performed if WBC <=10, OR if Squamous epithelial cells >5. If Squamous epithelial cells >5 suggest recollection.    Bacteria, UA NONE SEEN NONE SEEN   Squamous Epithelial / HPF 0-5 0 - 5 /HPF   WBC Clumps PRESENT    Mucus PRESENT     Comment: Performed at [facility], [address], [place], [place] [address]  Urine Culture     Status: None   Collection Time: 06/17/22  9:15 AM   Specimen:  Urine, Random  Result Value Ref Range   Specimen Description      URINE, RANDOM Performed at [facility], [address], [place], [place] [address]    Special Requests      NONE Reflexed from [id number] Performed at [facility], [address], [place], [place] [address]    Culture      NO GROWTH Performed at [facility], [place] [address] [place], [place] [address]    Report Status 06/18/2022 FINAL   Blood Culture (routine x 2)     Status: None (Preliminary result)   Collection Time: 06/17/22  9:28 AM   Specimen: BLOOD  Result Value Ref Range   Specimen Description BLOOD BLOOD RIGHT HAND    Special Requests      BOTTLES DRAWN AEROBIC AND ANAEROBIC Blood Culture adequate volume   Culture      NO GROWTH < 24 HOURS Performed at [facility], [address], [place], [place] [address]    Report Status PENDING   Troponin I (High Sensitivity)  Status: None   Collection Time: 06/17/22 11:15 AM  Result Value Ref Range   Troponin I (High Sensitivity) 3 <18 ng/L    Comment: (NOTE) Elevated high sensitivity troponin I (hsTnI) values and significant  changes across serial measurements may suggest ACS but many other  chronic and acute conditions are known to elevate hsTnI results.  Refer to the "Links" section for chest pain algorithms and additional  guidance. Performed at [facility], [address], [place], [place] [address]   Glucose, capillary     Status: Abnormal   Collection Time: 06/17/22  6:32 PM  Result Value Ref Range   Glucose-Capillary 192 (H) 70 - 99 mg/dL    Comment: Glucose reference range applies only to samples taken after fasting for at least 8 hours.  Glucose, capillary     Status: Abnormal   Collection Time: 06/17/22  8:25 PM  Result Value Ref Range   Glucose-Capillary 206 (H) 70 - 99 mg/dL    Comment: Glucose reference range applies only to samples taken after fasting for at least 8 hours.   Comment 1 Notify RN    Comment 2 Document in Chart    Protime-INR     Status: None   Collection Time: 06/18/22  4:19 AM  Result Value Ref Range   Prothrombin Time 14.3 11.4 - 15.2 seconds   INR 1.1 0.8 - 1.2    Comment: (NOTE) INR goal varies based on device and disease states. Performed at [facility], [address], [place], [place] [address]   APTT     Status: None   Collection Time: 06/18/22  4:19 AM  Result Value Ref Range   aPTT 31 24 - 36 seconds    Comment: Performed at [facility], [address] [place], [place] [address]  Comprehensive metabolic panel     Status: Abnormal   Collection Time: 06/18/22  4:19 AM  Result Value Ref Range   Sodium 138 135 - 145 mmol/L   Potassium 4.2 3.5 - 5.1 mmol/L   Chloride 103 98 - 111 mmol/L   CO2 27 22 - 32 mmol/L   Glucose, Bld 152 (H) 70 - 99 mg/dL    Comment: Glucose reference range applies only to samples taken after fasting for at least 8 hours.   BUN 12 8 - 23 mg/dL   Creatinine, Ser 0.66 0.61 - 1.24 mg/dL   Calcium 8.1 (L) 8.9 - 10.3 mg/dL   Total Protein 5.5 (L) 6.5 - 8.1 g/dL   Albumin 2.3 (L) 3.5 - 5.0 g/dL   AST 12 (L) 15 - 41 U/L   ALT <5 0 - 44 U/L   Alkaline Phosphatase 83 38 - 126 U/L   Total Bilirubin 0.4 0.3 - 1.2 mg/dL   GFR, Estimated >60 >60 mL/min    Comment: (NOTE) Calculated using the CKD-EPI Creatinine Equation (2021)    Anion gap 8 5 - 15    Comment: Performed at [facility], [address], [place], [place] [address]  CBC     Status: Abnormal   Collection Time: 06/18/22  4:19 AM  Result Value Ref Range   WBC 8.3 4.0 - 10.5 K/uL   RBC 3.54 (L) 4.22 - 5.81 MIL/uL   Hemoglobin 9.7 (L) 13.0 - 17.0 g/dL   HCT 31.6 (L) 39.0 - 52.0 %   MCV 89.3 80.0 - 100.0 fL   MCH 27.4 26.0 - 34.0 pg   MCHC 30.7 30.0 - 36.0 g/dL   RDW 15.1 11.5 -  15.5 %   Platelets 395 150 - 400 K/uL   nRBC 0.0 0.0 - 0.2 %    Comment: Performed at [facility], [address], [place],  [address]  Glucose, capillary     Status: Abnormal   Collection Time: 06/18/22  7:31 AM   Result Value Ref Range   Glucose-Capillary 142 (H) 70 - 99 mg/dL    Comment: Glucose reference range applies only to samples taken after fasting for at least 8 hours.  Glucose, capillary     Status: Abnormal   Collection Time: 06/18/22 11:40 AM  Result Value Ref Range   Glucose-Capillary 236 (H) 70 - 99 mg/dL    Comment: Glucose reference range applies only to samples taken after fasting for at least 8 hours.   Recent Results (from the past 240 hour(s))  Resp panel by RT-PCR (RSV, Flu A&B, Covid) Anterior Nasal Swab     Status: None   Collection Time: 06/17/22  8:57 AM   Specimen: Anterior Nasal Swab  Result Value Ref Range Status   SARS Coronavirus 2 by RT PCR NEGATIVE NEGATIVE Final    Comment: (NOTE) SARS-CoV-2 target nucleic acids are NOT DETECTED.  The SARS-CoV-2 RNA is generally detectable in upper respiratory specimens during the acute phase of infection. The lowest concentration of SARS-CoV-2 viral copies this assay can detect is 138 copies/mL. A negative result does not preclude SARS-Cov-2 infection and should not be used as the sole basis for treatment or other patient management decisions. A negative result may occur with  improper specimen collection/handling, submission of specimen other than nasopharyngeal swab, presence of viral mutation(s) within the areas targeted by this assay, and inadequate number of viral copies(<138 copies/mL). A negative result must be combined with clinical observations, patient history, and epidemiological information. The expected result is Negative.  Fact Sheet for Patients:  EntrepreneurPulse.com.au  Fact Sheet for Healthcare Providers:  IncredibleEmployment.be  This test is no t yet approved or cleared by the [place] FDA and  has been authorized for detection and/or diagnosis of SARS-CoV-2 by FDA under an Emergency Use Authorization (EUA). This EUA will remain  in effect (meaning this test  can be used) for the duration of the COVID-19 declaration under Section 564(b)(1) of the Act, 21 U.S.C.section 360bbb-3(b)(1), unless the authorization is terminated  or revoked sooner.       Influenza A by PCR NEGATIVE NEGATIVE Final   Influenza B by PCR NEGATIVE NEGATIVE Final    Comment: (NOTE) The Xpert Xpress SARS-CoV-2/FLU/RSV plus assay is intended as an aid in the diagnosis of influenza from Nasopharyngeal swab specimens and should not be used as a sole basis for treatment. Nasal washings and aspirates are unacceptable for Xpert Xpress SARS-CoV-2/FLU/RSV testing.  Fact Sheet for Patients: EntrepreneurPulse.com.au  Fact Sheet for Healthcare Providers: IncredibleEmployment.be  This test is not yet approved or cleared by the [place] FDA and has been authorized for detection and/or diagnosis of SARS-CoV-2 by FDA under an Emergency Use Authorization (EUA). This EUA will remain in effect (meaning this test can be used) for the duration of the COVID-19 declaration under Section 564(b)(1) of the Act, 21 U.S.C. section 360bbb-3(b)(1), unless the authorization is terminated or revoked.     Resp Syncytial Virus by PCR NEGATIVE NEGATIVE Final    Comment: (NOTE) Fact Sheet for Patients: EntrepreneurPulse.com.au  Fact Sheet for Healthcare Providers: IncredibleEmployment.be  This test is not yet approved or cleared by the [place] FDA and has been authorized for detection and/or  diagnosis of SARS-CoV-2 by FDA under an Emergency Use Authorization (EUA). This EUA will remain in effect (meaning this test can be used) for the duration of the COVID-19 declaration under Section 564(b)(1) of the Act, 21 U.S.C. section 360bbb-3(b)(1), unless the authorization is terminated or revoked.  Performed at [facility], [address] [place], [place] [address]   Blood Culture (routine x 2)     Status: None  (Preliminary result)   Collection Time: 06/17/22  9:01 AM   Specimen: BLOOD LEFT HAND  Result Value Ref Range Status   Specimen Description BLOOD LEFT HAND  Final   Special Requests   Final    BOTTLES DRAWN AEROBIC AND ANAEROBIC Blood Culture adequate volume   Culture   Final    NO GROWTH < 24 HOURS Performed at [facility], [address] [place], [place] [address]    Report Status PENDING  Incomplete  Urine Culture     Status: None   Collection Time: 06/17/22  9:15 AM   Specimen: Urine, Random  Result Value Ref Range Status   Specimen Description   Final    URINE, RANDOM Performed at [facility], [address] [place], [place] [address]    Special Requests   Final    NONE Reflexed from [id number] Performed at [facility], [address] [place], [place] [address]    Culture   Final    NO GROWTH Performed at [facility], [place] [address] [place], [place] [address]    Report Status 06/18/2022 FINAL  Final  Blood Culture (routine x 2)     Status: None (Preliminary result)   Collection Time: 06/17/22  9:28 AM   Specimen: BLOOD  Result Value Ref Range Status   Specimen Description BLOOD BLOOD RIGHT HAND  Final   Special Requests   Final    BOTTLES DRAWN AEROBIC AND ANAEROBIC Blood Culture adequate volume   Culture   Final    NO GROWTH < 24 HOURS Performed at [facility], [address], [place], [place] [address]    Report Status PENDING  Incomplete   Creatinine: Recent Labs    06/17/22 0901 06/18/22 0419  CREATININE 0.58* 0.66   Baseline Creatinine: 0.6  Impression/Assessment:  72yo with urinary retention and likely bladder spasms  Plan:  Urinary retention: We discussed the management of urinary retention with the patient and family and due to his limited mobility we agreed that the foley should remain in place. Bladder spasms: the patient lower back pain may be related to bladder spasms. He will trial mirabegron 25mg  daily and levsin 0.125mg  tabs q4 hours  prn  Nicolette Bang 06/18/2022, 1:09 PM

## 2022-06-18 NOTE — Evaluation (Signed)
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  After first admission 2 months ago was sent home with supplemental oxygen.  Was treated at [facility] facility for UTI and pneumonia 5 days ago.[name]  History of chronic Foley catheter placed 2 months -was last changed in January 2024. Was not changed on last hospitalization.  Patient was discharged on Augmentin and Levaquin 2 days ago.     Presenting today with testicular pain.  Patient and his wife his symptoms started last night-bilateral testicular pain, wife noted of sediment in his Foley catheter.     Wife reports he does not look well.  He seen weak overall, poor appetite, with poor p.o. intake solids and liquids....  Reporting he is not ambulatory at baseline -previous history of hip fractures, deconditioning multiple hospitalizations pivots at baseline, uses wheelchair.  Has motorized wheelchair at home.   Clinical Impression  Patient requires HOB raised and increased time, frequent rest breaks with slow labored movement for sitting up at bedside.  Patient had difficulty with proper hand placement during stand pivot transfer to chair requiring frequent repeated verbal/tactile cueing for following instructions.  Patient tolerated sitting up in chair with his spouse present after therapy.  Patient will benefit from continued skilled physical therapy in hospital and recommended venue below to increase strength, balance, endurance for safe ADLs and gait.        Recommendations for follow up therapy are one component of a multi-disciplinary discharge planning process, led by the attending physician.   Recommendations may be updated based on patient status, additional functional criteria and insurance authorization.  Follow Up Recommendations       Assistance Recommended at Discharge Set up Supervision/Assistance  Patient can return home with the following  A little help with bathing/dressing/bathroom;Help with stairs or ramp for entrance;Assistance with cooking/housework;A lot of help with walking and/or transfers    Equipment Recommendations None recommended by PT  Recommendations for Other Services       Functional Status Assessment Patient has had a recent decline in their functional status and demonstrates the ability to make significant improvements in function in a reasonable and predictable amount of time.     Precautions / Restrictions Precautions Precautions: Fall Restrictions Weight Bearing Restrictions: No      Mobility  Bed Mobility Overal bed mobility: Needs Assistance Bed Mobility: Supine to Sit     Supine to sit: HOB elevated, Mod assist     General bed mobility comments: slow labored movement requiring frequent rest breaks    Transfers Overall transfer level: Needs assistance Equipment used: 1 person hand held assist Transfers: Sit to/from Stand Sit to Stand: Mod assist Stand pivot transfers: Mod assist         General transfer comment: required repeated verbal/tactile cueing during stand pivot transfer, had to lean on armrest of chair    Ambulation/Gait                  Stairs            Wheelchair Mobility    Modified Rankin (Stroke Patients Only)       Balance Overall balance assessment: Needs assistance Sitting-balance support: Feet supported, No upper extremity supported Sitting  balance-Leahy Scale: Fair Sitting balance - Comments: seated at EOB   Standing balance support: During functional activity, Bilateral upper extremity supported Standing balance-Leahy Scale: Poor Standing balance comment: partially stood  with hand held assist while leaning on armrest of chair                             Pertinent Vitals/Pain Pain Assessment Pain Assessment: Faces Faces Pain Scale: Hurts whole lot Pain Location: back Pain Descriptors / Indicators: Grimacing, Guarding, Moaning Pain Intervention(s): Limited activity within patient's tolerance, Monitored during session, Repositioned    Home Living Family/patient expects to be discharged to:: Private residence Living Arrangements: Spouse/significant other Available Help at Discharge: Family;Available 24 hours/day;Personal care attendant Type of Home: Mobile home Home Access: Ramped entrance       Home Layout: One level Home Equipment: Rolling Walker (2 wheels);BSC/3in1;Tub bench;Wheelchair - power;Hospital bed;Other (comment) Additional Comments: Aides avaialble 5x a week when wife is away.    Prior Function Prior Level of Function : Needs assist       Physical Assist : Mobility (physical);ADLs (physical) Mobility (physical): Bed mobility;Transfers;Gait ADLs (physical): Grooming;Bathing;Dressing;Toileting;IADLs Mobility Comments: Pivot transfers to chair with assist and PRN use of RW; uses power chair for mobility. ADLs Comments: Assisted for bathing, dressing, toileting, grooming, and IADL's.     Hand Dominance   Dominant Hand: Right    Extremity/Trunk Assessment   Upper Extremity Assessment Upper Extremity Assessment: Defer to OT evaluation    Lower Extremity Assessment Lower Extremity Assessment: Generalized weakness    Cervical / Trunk Assessment Cervical / Trunk Assessment: Kyphotic  Communication   Communication: HOH  Cognition Arousal/Alertness: Awake/alert Behavior During Therapy: WFL for tasks assessed/performed Overall Cognitive Status: Within Functional Limits for tasks assessed                                          General Comments      Exercises     Assessment/Plan    PT  Assessment Patient needs continued PT services  PT Problem List Decreased strength;Decreased activity tolerance;Decreased balance;Decreased mobility       PT Treatment Interventions DME instruction;Functional mobility training;Therapeutic activities;Therapeutic exercise;Patient/family education;Balance training    PT Goals (Current goals can be found in the Care Plan section)  Acute Rehab PT Goals Patient Stated Goal: return home with family, home aides to assist PT Goal Formulation: With patient/family Time For Goal Achievement: 06/22/22 Potential to Achieve Goals: Good    Frequency Min 3X/week     Co-evaluation PT/OT/SLP Co-Evaluation/Treatment: Yes Reason for Co-Treatment: To address functional/ADL transfers PT goals addressed during session: Mobility/safety with mobility;Balance OT goals addressed during session: ADL's and self-care       AM-PAC PT "6 Clicks" Mobility  Outcome Measure Help needed turning from your back to your side while in a flat bed without using bedrails?: A Lot Help needed moving from lying on your back to sitting on the side of a flat bed without using bedrails?: A Lot Help needed moving to and from a bed to a chair (including a wheelchair)?: A Lot Help needed standing up from a chair using your arms (e.g., wheelchair or bedside chair)?: A Lot Help needed to walk in hospital room?: Total Help needed climbing 3-5 steps with a railing? : Total 6 Click Score: 10    End of Session Equipment Utilized  During Treatment: Oxygen Activity Tolerance: Patient tolerated treatment well;Patient limited by fatigue Patient left: in chair;with call bell/phone within reach;with chair alarm set;with family/visitor present Nurse Communication: Mobility status PT Visit Diagnosis: Unsteadiness on feet (R26.81);Other abnormalities of gait and mobility (R26.89);Muscle weakness (generalized) (M62.81)    Time: [id number] PT Time Calculation (min) (ACUTE ONLY): 26  min   Charges:   PT Evaluation $PT Eval Moderate Complexity: 1 Mod PT Treatments $Therapeutic Activity: 23-37 mins        12:17 PM, 06/18/22 Lonell Grandchild, MPT Physical Therapist with [facility] 336 [phone] office [phone] mobile phone

## 2022-06-18 NOTE — Plan of Care (Signed)
  Problem: Acute Rehab OT Goals (only OT should resolve) Goal: Pt. Will Perform Grooming Flowsheets (Taken 06/18/2022 0935) Pt Will Perform Grooming:  with supervision  sitting Goal: Pt. Will Perform Upper Body Bathing Flowsheets (Taken 06/18/2022 0935) Pt Will Perform Upper Body Bathing:  with supervision  sitting Goal: Pt. Will Perform Upper Body Dressing Flowsheets (Taken 06/18/2022 0935) Pt Will Perform Upper Body Dressing:  with supervision  sitting Goal: Pt. Will Perform Lower Body Dressing Flowsheets (Taken 06/18/2022 0935) Pt Will Perform Lower Body Dressing:  with mod assist  with adaptive equipment  sitting/lateral leans Goal: Pt. Will Transfer To Toilet Flowsheets (Taken 06/18/2022 0935) Pt Will Transfer to Toilet:  with mod assist  squat pivot transfer  with min assist  stand pivot transfer Goal: Pt/Caregiver Will Perform Home Exercise Program Flowsheets (Taken 06/18/2022 0935) Pt/caregiver will Perform Home Exercise Program:  Increased strength  Increased ROM  Both right and left upper extremity  Independently  Sabryn Preslar OT, MOT

## 2022-06-18 NOTE — Plan of Care (Signed)
  Problem: Acute Rehab PT Goals(only PT should resolve) Goal: Pt will Roll Supine to Side Outcome: Progressing Flowsheets (Taken 06/18/2022 1219) Pt will Roll Supine to Side: min guard Goal: Pt Will Go Supine/Side To Sit Outcome: Progressing Flowsheets (Taken 06/18/2022 1219) Pt will go Supine/Side to Sit: with min guard assist Goal: Pt Will Go Sit To Supine/Side Outcome: Progressing Flowsheets (Taken 06/18/2022 1219) Pt will go Sit to Supine/Side:  with min guard assist  with minimal assist Goal: Patient Will Perform Sitting Balance Outcome: Progressing Flowsheets (Taken 06/18/2022 1219) Patient will perform sitting balance: with modified independence Goal: Patient Will Transfer Sit To/From Stand Outcome: Progressing Flowsheets (Taken 06/18/2022 1219) Patient will transfer sit to/from stand:  with minimal assist  with moderate assist Goal: Pt Will Transfer Bed To Chair/Chair To Bed Outcome: Progressing Flowsheets (Taken 06/18/2022 1219) Pt will Transfer Bed to Chair/Chair to Bed:  with min assist  with mod assist   12:20 PM, 06/18/22 Lonell Grandchild, MPT Physical Therapist with [facility] [phone] office [phone] mobile phone

## 2022-06-18 NOTE — Progress Notes (Signed)
PROGRESS NOTE    Patient: Aaron Leon                            PCP: [facility]                    DOB: 1950/12/18            DOA: 06/17/2022 [id number]             DOS: 06/18/2022, 10:59 AM   LOS: 0 days   Date of Service: The patient was seen and examined on 06/18/2022  Subjective:   The patient was seen and examined this morning. Hemodynamically stable.  Afebrile normotensive Comfortable sitting up in a chair, stating feeling much better today   Brief Narrative:   Aaron Leon is a a 72 year old male with a significant history of HTN, HLD, chronic urinary retention, chronic anemia, COPD, DM 2, CVA, cognitive impairment (dementia with??Parkinson disease) Recent hospitalization this month for pneumonia.  After first admission 2 months ago was sent home with supplemental oxygen. Was treated at [facility] facility for UTI and pneumonia 5 days ago.[name]  History of chronic Foley catheter placed 2 months -was last changed in January 2024. Was not changed on last hospitalization. Patient was discharged on Augmentin and Levaquin 2 days ago.  Presenting today with testicular pain.  Patient and his wife his symptoms started last night-bilateral testicular pain, wife noted of sediment in his Foley catheter.  Wife reports he does not look well.  He seen weak overall, poor appetite, with poor p.o. intake solids and liquids.... Reporting he is not ambulatory at baseline -previous history of hip fractures, deconditioning multiple hospitalizations pivots at baseline, uses wheelchair.  Has motorized wheelchair at home.    ED evaluation/course: Blood pressure (!) 148/74, pulse 73, Temp. 97.7 F (36.5 C), temperature source Oral, RR (!) 26, weight 73 kg, SpO2 94 %.  On 2 L of oxygen  CBC WBC 9.9, hemoglobin 10.4, platelets 411, CMP sodium 139, potassium 3.9, BUN 10, creatinine 0.58, calcium 8.2, troponin 3, glucose 165, lactic acid 1.3, magnesium 1.4 UA: Cloudy, hemoglobin,  moderate leukocyte, bacteria none, UA WBC>50  CTA: Abdomen pelvis/chest And pelvis organs within normal limits, Chest small PE peripheral left lower lobe branch artery Multiple ill-defined patchy opacities are noted in both lungs suggesting multifocal pneumonia.  Small left pleural effusion subsegmental atelectasis.  Extensive CAD   Patient started on IV fluids, broad-spectrum antibiotics of cefepime and doxycycline  Blood and Urine cultures have been obtained, IVF, heparin      Assessment & Plan:   Principal Problem:   Pulmonary embolism (HCC) Active Problems:   Lobar pneumonia ([place])   Testicular pain   Diabetes mellitus ([place])   Essential hypertension   Dementia with behavioral disturbance (HCC)   History of CVA (cerebrovascular accident)   Hyperlipidemia   Debility   Acute on chronic urinary retention     Assessment and Plan: * Pulmonary embolism (HCC) Asymptomatic, denies any chest pain or shortness of breath SpO2 94 %.  On 2 L of oxygen -at baseline  -Initially patient was started on heparin drip, switching to p.o. Eliquis on 06/17/2022  -Will obtain 2D echocardiogram >> -Obtaining lower extremity Doppler studies  Testicular pain -Epididymitis versus acute on chronic UTI mentation -Improved with Foley catheter exchanged in ED -Continue to monitor closely -Ultrasound revealed negative for any testicular torsion MPRESSION::  There is no evidence of testicular  torsion. There is 5 mm cyst in the left testis. No other focal abnormalities are seen in both testes. Small bilateral hydrocele.  Lobar pneumonia ([place]) -Ruling out SIRS, sepsis -Was treated for pneumonia and UTI at [facility] Current vitals Blood pressure (!) 107/48, pulse 62, temperature 98 F (36.7 C), resp. rate 18, weight 70.5 kg, SpO2 92 %.   CBC WBC 9.9,  lactic acid 1.3,  CTA: Abdomen pelvis/chest And pelvis organs within normal limits, Chest small PE peripheral left lower lobe branch  artery Multiple ill-defined patchy opacities are noted in both lungs suggesting multifocal pneumonia.  Small left pleural effusion subsegmental atelectasis.  Extensive CAD   Status post IV fluid hydration, discontinuing IV fluids, encourage oral hydration  On  Broad-spectrum antibiotics of Cefepime and doxycycline  Blood and urine cultures >>> reporting no growth If cultures remain negative, will discontinue antibiotics  History of CVA (cerebrovascular accident) - Continue home dose aspirin, statins,  Dementia with behavioral disturbance (HCC) Multifactorial  History of Parkinson dementia -progressive neurological disorder, currently severe debility neuropathy  -Home medications reviewed, on Sinemet, Namenda, galantamine   Essential hypertension - Mildly hypertensive, but holding home medication of Norvasc, lisinopril, Lasix, continue with metoprolol   Diabetes mellitus ([place]) Were holding home medication of metformin -Continue home medication of Levemir -Checking CBG q. ACHS, SSI coverage -Checking A1c 7.2  Acute on chronic urinary retention - Chronic Foley catheter exchanged in ED on 06/18/2022  -Possible neurogenic bladder, chronic Foley catheter has been placed previous hospitalization for past 2 months, last changed in January 2024 per patient and his wife -Recent hospitalization at [facility] few days ago Foley catheter was not changed  - Testicular pain improved  -Continue home medication of Flomax, oxybutynin  Debility -Acute on chronic severe debility, at baseline ambulates with wheelchair, motorized wheelchair at home -Unknown underlying etiology: neuromuscular versus h/o stroke,  prolonged chronic illness progressing to debility, prolonged hospitalizations associate with neuropathy  -According to his wife he had a hip fracture surgery this past January he has not been able to ambulate since  -Consulting PT/OT -patient recommendation -The patient and his wife  report of active home health that is in place and will continue to follow   Hyperlipidemia - On high-dose statin Lipitor 80 mg daily--due to progressive generalized weakness, will check LFTs, fasting lipid panel, reduce the dose to 40 mg daily    ------------------------------------------------------------------------------------------------------------------------ Nutritional status:  The patient's BMI is: Body mass index is 21.08 kg/m. I agree with the assessment and plan as outlined below: Nutrition Status:       Cultures; Blood Cultures x 2 >> NGT Urine Culture  >>> NGT   ----------------------------------------------  DVT prophylaxis:  TED hose Start: 06/17/22 1441 SCDs Start: 06/17/22 1441 apixaban (ELIQUIS) tablet 10 mg  apixaban (ELIQUIS) tablet 5 mg   Code Status:   Code Status: DNR  Family Communication: Wife present at bedside-updated The above findings and plan of care has been discussed with patient (and family)  in detail,  they expressed understanding and agreement of above. -Advance care planning has been discussed.   Admission status:   Status is: Observation The patient remains OBS appropriate and will d/c before 2 midnights.   Disposition: From  - home             Planning for discharge in 1-2 days: to   Procedures:   No admission procedures for hospital encounter.   Antimicrobials:  Anti-infectives (From admission, onward)    Start  Dose/Rate Route Frequency Ordered Stop   06/17/22 2200  ceFEPIme (MAXIPIME) 2 g in sodium chloride 0.9 % 100 mL IVPB        2 g 200 mL/hr over 30 Minutes Intravenous Every 8 hours 06/17/22 1238     06/17/22 1415  doxycycline (VIBRA-TABS) tablet 100 mg        100 mg Oral Every 12 hours 06/17/22 1410 06/24/22 0959   06/17/22 1230  ceFEPIme (MAXIPIME) 2 g in sodium chloride 0.9 % 100 mL IVPB        2 g 200 mL/hr over 30 Minutes Intravenous  Once 06/17/22 1227 06/17/22 1410        Medication:   apixaban   10 mg Oral BID   Followed by   Derrill Memo ON 06/24/2022] apixaban  5 mg Oral BID   ascorbic acid  500 mg Oral Daily   atorvastatin  40 mg Oral QHS   carbidopa-levodopa  1 tablet Oral QHS   carbidopa-levodopa  2 tablet Oral TID AC   Chlorhexidine Gluconate Cloth  6 each Topical Daily   cholecalciferol  1,000 Units Oral Daily   doxycycline  100 mg Oral Q12H   ferrous sulfate  325 mg Oral q AM   [START ON 06/19/2022] furosemide  20 mg Oral Daily   gabapentin  600 mg Oral BID WC   gabapentin  900 mg Oral QHS   galantamine  24 mg Oral QPM   insulin aspart  0-6 Units Subcutaneous TID WC   insulin detemir  12 Units Subcutaneous q morning   loratadine  10 mg Oral Daily   magnesium oxide  400 mg Oral QHS   magnesium oxide  800 mg Oral Daily   melatonin  3 mg Oral QHS   memantine  10 mg Oral BID   metoprolol tartrate  25 mg Oral BID   mirabegron ER  25 mg Oral Daily   mometasone-formoterol  2 puff Inhalation BID   sertraline  100 mg Oral q AM   sodium chloride flush  3 mL Intravenous Q12H   tamsulosin  0.4 mg Oral QHS   umeclidinium bromide  1 puff Inhalation Daily    acetaminophen **OR** acetaminophen, baclofen, bisacodyl, hydrALAZINE, HYDROmorphone (DILAUDID) injection, hyoscyamine, ipratropium, levalbuterol, ondansetron **OR** ondansetron (ZOFRAN) IV, oxyCODONE, senna-docusate, sodium phosphate, traZODone   Objective:   Vitals:   06/18/22 0123 06/18/22 0527 06/18/22 0704 06/18/22 1038  BP: 139/68 100/63  (!) 107/48  Pulse: 60 (!) 59  62  Resp:  18  18  Temp: 98 F (36.7 C) 98 F (36.7 C)    TempSrc: Oral     SpO2: 95% 92% 92% 92%  Weight:  70.5 kg      Intake/Output Summary (Last 24 hours) at 06/18/2022 1059 Last data filed at 06/18/2022 0956 Gross per 24 hour  Intake 1330.31 ml  Output 1100 ml  Net 230.31 ml   Filed Weights   06/17/22 0609 06/18/22 0527  Weight: 73 kg 70.5 kg     Physical examination:    General:  AAO x 3,  cooperative, no distress; cachectic  chronically ill looking  HEENT:  Normocephalic, PERRL, otherwise with in Normal limits   Neuro:  CNII-XII intact. , normal motor and sensation, reflexes intact   Lungs:   Clear to auscultation BL, Respirations unlabored,  No wheezes / crackles  Cardio:    S1/S2, RRR, No murmure, No Rubs or Gallops   Abdomen:  Soft, non-tender, bowel sounds active all four quadrants, no  guarding or peritoneal signs.  Muscular  skeletal:  Limited exam - Sever global generalized weaknesses - in bed, able to move all 4 extremities,   2+ pulses,  symmetric, No pitting edema  Skin:  Dry, warm to touch, negative for any Rashes,  Wounds: Please see nursing documentation  Chronic Foley catheter in place for chronic urinary retention     ------------------------------------------------------------------------------------------------------------------------    LABs:     Latest Ref Rng & Units 06/18/2022    4:19 AM 06/17/2022    9:01 AM 03/21/2008    5:24 AM  CBC  WBC 4.0 - 10.5 K/uL 8.3  9.9  6.4   Hemoglobin 13.0 - 17.0 g/dL 9.7  10.4  14.9   Hematocrit 39.0 - 52.0 % 31.6  33.2  45.3   Platelets 150 - 400 K/uL 395  411  281       Latest Ref Rng & Units 06/18/2022    4:19 AM 06/17/2022    9:01 AM 03/21/2008    5:24 AM  CMP  Glucose 70 - 99 mg/dL 152  165  142   BUN 8 - 23 mg/dL 12  10  7    Creatinine 0.61 - 1.24 mg/dL 0.66  0.58  0.65   Sodium 135 - 145 mmol/L 138  139  140   Potassium 3.5 - 5.1 mmol/L 4.2  3.9  3.8   Chloride 98 - 111 mmol/L 103  103  108   CO2 22 - 32 mmol/L 27  26  25    Calcium 8.9 - 10.3 mg/dL 8.1  8.2  9.3   Total Protein 6.5 - 8.1 g/dL 5.5  6.1    Total Bilirubin 0.3 - 1.2 mg/dL 0.4  0.5    Alkaline Phos 38 - 126 U/L 83  86    AST 15 - 41 U/L 12  12    ALT 0 - 44 U/L <5  8         Micro Results Recent Results (from the past 240 hour(s))  Resp panel by RT-PCR (RSV, Flu A&B, Covid) Anterior Nasal Swab     Status: None   Collection Time: 06/17/22  8:57 AM   Specimen:  Anterior Nasal Swab  Result Value Ref Range Status   SARS Coronavirus 2 by RT PCR NEGATIVE NEGATIVE Final    Comment: (NOTE) SARS-CoV-2 target nucleic acids are NOT DETECTED.  The SARS-CoV-2 RNA is generally detectable in upper respiratory specimens during the acute phase of infection. The lowest concentration of SARS-CoV-2 viral copies this assay can detect is 138 copies/mL. A negative result does not preclude SARS-Cov-2 infection and should not be used as the sole basis for treatment or other patient management decisions. A negative result may occur with  improper specimen collection/handling, submission of specimen other than nasopharyngeal swab, presence of viral mutation(s) within the areas targeted by this assay, and inadequate number of viral copies(<138 copies/mL). A negative result must be combined with clinical observations, patient history, and epidemiological information. The expected result is Negative.  Fact Sheet for Patients:  EntrepreneurPulse.com.au  Fact Sheet for Healthcare Providers:  IncredibleEmployment.be  This test is no t yet approved or cleared by the [place] FDA and  has been authorized for detection and/or diagnosis of SARS-CoV-2 by FDA under an Emergency Use Authorization (EUA). This EUA will remain  in effect (meaning this test can be used) for the duration of the COVID-19 declaration under Section 564(b)(1) of the Act, 21 U.S.C.section 360bbb-3(b)(1), unless the authorization is terminated  or revoked sooner.       Influenza A by PCR NEGATIVE NEGATIVE Final   Influenza B by PCR NEGATIVE NEGATIVE Final    Comment: (NOTE) The Xpert Xpress SARS-CoV-2/FLU/RSV plus assay is intended as an aid in the diagnosis of influenza from Nasopharyngeal swab specimens and should not be used as a sole basis for treatment. Nasal washings and aspirates are unacceptable for Xpert Xpress SARS-CoV-2/FLU/RSV testing.  Fact  Sheet for Patients: EntrepreneurPulse.com.au  Fact Sheet for Healthcare Providers: IncredibleEmployment.be  This test is not yet approved or cleared by the [place] FDA and has been authorized for detection and/or diagnosis of SARS-CoV-2 by FDA under an Emergency Use Authorization (EUA). This EUA will remain in effect (meaning this test can be used) for the duration of the COVID-19 declaration under Section 564(b)(1) of the Act, 21 U.S.C. section 360bbb-3(b)(1), unless the authorization is terminated or revoked.     Resp Syncytial Virus by PCR NEGATIVE NEGATIVE Final    Comment: (NOTE) Fact Sheet for Patients: EntrepreneurPulse.com.au  Fact Sheet for Healthcare Providers: IncredibleEmployment.be  This test is not yet approved or cleared by the [place] FDA and has been authorized for detection and/or diagnosis of SARS-CoV-2 by FDA under an Emergency Use Authorization (EUA). This EUA will remain in effect (meaning this test can be used) for the duration of the COVID-19 declaration under Section 564(b)(1) of the Act, 21 U.S.C. section 360bbb-3(b)(1), unless the authorization is terminated or revoked.  Performed at [facility], [address], [place], [place] [address]   Blood Culture (routine x 2)     Status: None (Preliminary result)   Collection Time: 06/17/22  9:01 AM   Specimen: BLOOD LEFT HAND  Result Value Ref Range Status   Specimen Description BLOOD LEFT HAND  Final   Special Requests   Final    BOTTLES DRAWN AEROBIC AND ANAEROBIC Blood Culture adequate volume   Culture   Final    NO GROWTH < 24 HOURS Performed at [facility], [address] [place], [place] [address]    Report Status PENDING  Incomplete  Urine Culture     Status: None   Collection Time: 06/17/22  9:15 AM   Specimen: Urine, Random  Result Value Ref Range Status   Specimen Description   Final    URINE,  RANDOM Performed at [facility], [address], [place], [place] [address]    Special Requests   Final    NONE Reflexed from [id number] Performed at [facility], [address], [place], [place] [address]    Culture   Final    NO GROWTH Performed at [facility], [place] [address], [place],  [address]    Report Status 06/18/2022 FINAL  Final  Blood Culture (routine x 2)     Status: None (Preliminary result)   Collection Time: 06/17/22  9:28 AM   Specimen: BLOOD  Result Value Ref Range Status   Specimen Description BLOOD BLOOD RIGHT HAND  Final   Special Requests   Final    BOTTLES DRAWN AEROBIC AND ANAEROBIC Blood Culture adequate volume   Culture   Final    NO GROWTH < 24 HOURS Performed at [facility], [address] [place],  [address]    Report Status PENDING  Incomplete    Radiology Reports CT Angio Chest PE W and/or Wo Contrast  Result Date: 06/17/2022 CLINICAL DATA:  High probability of pulmonary embolus. Testicular pain. EXAM: CT ANGIOGRAPHY CHEST CT ABDOMEN AND PELVIS  WITH CONTRAST TECHNIQUE: Multidetector CT imaging of the chest was performed using the standard protocol during bolus administration of intravenous contrast. Multiplanar CT image reconstructions and MIPs were obtained to evaluate the vascular anatomy. Multidetector CT imaging of the abdomen and pelvis was performed using the standard protocol during bolus administration of intravenous contrast. RADIATION DOSE REDUCTION: This exam was performed according to the departmental dose-optimization program which includes automated exposure control, adjustment of the mA and/or kV according to patient size and/or use of iterative reconstruction technique. CONTRAST:  136mL OMNIPAQUE IOHEXOL 350 MG/ML SOLN COMPARISON:  July 08, 2018. FINDINGS: CTA CHEST FINDINGS Cardiovascular: There is a very small filling defect seen in a peripheral lower lobe branch of the left pulmonary artery best seen on image number 152  of series 6. Normal cardiac size. No pericardial effusion. Extensive coronary artery calcifications are noted. Mediastinum/Nodes: Small sliding-type hiatal hernia. No adenopathy. Thyroid gland is unremarkable. Lungs/Pleura: No pneumothorax is noted. Small left pleural effusion is noted with adjacent subsegmental atelectasis. Multiple patchy airspace opacities are noted throughout both lungs, most prominently seen in both upper lobes posteriorly, suggesting multifocal pneumonia. Musculoskeletal: Old right rib fractures are noted. No acute osseous abnormality is noted. Review of the MIP images confirms the above findings. CT ABDOMEN and PELVIS FINDINGS Hepatobiliary: No focal liver abnormality is seen. No gallstones, gallbladder wall thickening, or biliary dilatation. Pancreas: Unremarkable. No pancreatic ductal dilatation or surrounding inflammatory changes. Spleen: Normal in size without focal abnormality. Adrenals/Urinary Tract: Adrenal glands and kidneys appear normal. No hydronephrosis or renal obstruction is noted. Urinary bladder is decompressed secondary to Foley catheter. Stomach/Bowel: Stomach is within normal limits. Appendix appears normal. No evidence of bowel wall thickening, distention, or inflammatory changes. Vascular/Lymphatic: Aortic atherosclerosis. No enlarged abdominal or pelvic lymph nodes. Reproductive: Prostate gland is not well visualized due to scatter artifact arising from right hip prosthesis. Other: No abdominal wall hernia or abnormality. No abdominopelvic ascites. Musculoskeletal: Status post right total hip arthroplasty. Status post surgical internal fixation of old proximal left femoral fracture. Probable old T12 fracture is noted. No acute osseous abnormality is noted. Review of the MIP images confirms the above findings. IMPRESSION: Small pulmonary embolus seen in peripheral lower lobe branch of left pulmonary artery. Critical Value/emergent results were called by telephone at the  time of interpretation on 06/17/2022 at 1:12 pm to provider Godfrey Pick , who verbally acknowledged these results. Multiple ill-defined patchy opacities are noted in both lungs suggesting multifocal pneumonia. Small left pleural effusion is noted with minimal adjacent subsegmental atelectasis. Extensive coronary artery calcifications are noted suggesting coronary disease. Small sliding-type hiatal hernia. No acute abnormality seen in the abdomen or pelvis. Aortic Atherosclerosis (ICD10-I70.0). Electronically Signed   By: Marijo Conception M.D.   On: 06/17/2022 13:12   CT ABDOMEN PELVIS W CONTRAST  Result Date: 06/17/2022 CLINICAL DATA:  High probability of pulmonary embolus. Testicular pain. EXAM: CT ANGIOGRAPHY CHEST CT ABDOMEN AND PELVIS WITH CONTRAST TECHNIQUE: Multidetector CT imaging of the chest was performed using the standard protocol during bolus administration of intravenous contrast. Multiplanar CT image reconstructions and MIPs were obtained to evaluate the vascular anatomy. Multidetector CT imaging of the abdomen and pelvis was performed using the standard protocol during bolus administration of intravenous contrast. RADIATION DOSE REDUCTION: This exam was performed according to the departmental dose-optimization program which includes automated exposure control, adjustment of the mA and/or kV according to patient size and/or use of iterative reconstruction technique. CONTRAST:  18mL OMNIPAQUE IOHEXOL 350 MG/ML SOLN COMPARISON:  July 08, 2018. FINDINGS: CTA CHEST FINDINGS Cardiovascular: There is a very small filling defect seen in a peripheral lower lobe branch of the left pulmonary artery best seen on image number 152 of series 6. Normal cardiac size. No pericardial effusion. Extensive coronary artery calcifications are noted. Mediastinum/Nodes: Small sliding-type hiatal hernia. No adenopathy. Thyroid gland is unremarkable. Lungs/Pleura: No pneumothorax is noted. Small left pleural effusion is noted  with adjacent subsegmental atelectasis. Multiple patchy airspace opacities are noted throughout both lungs, most prominently seen in both upper lobes posteriorly, suggesting multifocal pneumonia. Musculoskeletal: Old right rib fractures are noted. No acute osseous abnormality is noted. Review of the MIP images confirms the above findings. CT ABDOMEN and PELVIS FINDINGS Hepatobiliary: No focal liver abnormality is seen. No gallstones, gallbladder wall thickening, or biliary dilatation. Pancreas: Unremarkable. No pancreatic ductal dilatation or surrounding inflammatory changes. Spleen: Normal in size without focal abnormality. Adrenals/Urinary Tract: Adrenal glands and kidneys appear normal. No hydronephrosis or renal obstruction is noted. Urinary bladder is decompressed secondary to Foley catheter. Stomach/Bowel: Stomach is within normal limits. Appendix appears normal. No evidence of bowel wall thickening, distention, or inflammatory changes. Vascular/Lymphatic: Aortic atherosclerosis. No enlarged abdominal or pelvic lymph nodes. Reproductive: Prostate gland is not well visualized due to scatter artifact arising from right hip prosthesis. Other: No abdominal wall hernia or abnormality. No abdominopelvic ascites. Musculoskeletal: Status post right total hip arthroplasty. Status post surgical internal fixation of old proximal left femoral fracture. Probable old T12 fracture is noted. No acute osseous abnormality is noted. Review of the MIP images confirms the above findings. IMPRESSION: Small pulmonary embolus seen in peripheral lower lobe branch of left pulmonary artery. Critical Value/emergent results were called by telephone at the time of interpretation on 06/17/2022 at 1:12 pm to provider Godfrey Pick , who verbally acknowledged these results. Multiple ill-defined patchy opacities are noted in both lungs suggesting multifocal pneumonia. Small left pleural effusion is noted with minimal adjacent subsegmental  atelectasis. Extensive coronary artery calcifications are noted suggesting coronary disease. Small sliding-type hiatal hernia. No acute abnormality seen in the abdomen or pelvis. Aortic Atherosclerosis (ICD10-I70.0). Electronically Signed   By: Marijo Conception M.D.   On: 06/17/2022 13:12    SIGNED: Deatra James, MD, FHM. FAAFP. [facility] - [facility] Time spent > 55 min.  In seeing, evaluating and examining the patient. Reviewing medical records, labs, drawn plan of care. [facility],  Pager (please use [email or web address] to page/ text) Please use Epic Secure Chat for non-urgent communication (7AM-7PM)  If 7PM-7AM, please contact night-coverage [email or web address], 06/18/2022, 10:59 AM

## 2022-06-18 NOTE — Progress Notes (Signed)
Echo at bedside. Pt states that IV was hurting arm. IV removed will attempt to look once echo is complete.

## 2022-06-18 NOTE — Progress Notes (Signed)
Pt being transferred from chair to bed.

## 2022-06-19 DIAGNOSIS — R5381 Other malaise: Secondary | ICD-10-CM | POA: Diagnosis present

## 2022-06-19 DIAGNOSIS — I10 Essential (primary) hypertension: Secondary | ICD-10-CM | POA: Diagnosis present

## 2022-06-19 DIAGNOSIS — N433 Hydrocele, unspecified: Secondary | ICD-10-CM | POA: Diagnosis present

## 2022-06-19 DIAGNOSIS — Y846 Urinary catheterization as the cause of abnormal reaction of the patient, or of later complication, without mention of misadventure at the time of the procedure: Secondary | ICD-10-CM | POA: Diagnosis present

## 2022-06-19 DIAGNOSIS — J181 Lobar pneumonia, unspecified organism: Secondary | ICD-10-CM | POA: Diagnosis present

## 2022-06-19 DIAGNOSIS — J440 Chronic obstructive pulmonary disease with acute lower respiratory infection: Secondary | ICD-10-CM | POA: Diagnosis present

## 2022-06-19 DIAGNOSIS — Z794 Long term (current) use of insulin: Secondary | ICD-10-CM | POA: Diagnosis not present

## 2022-06-19 DIAGNOSIS — Z66 Do not resuscitate: Secondary | ICD-10-CM | POA: Diagnosis present

## 2022-06-19 DIAGNOSIS — I2510 Atherosclerotic heart disease of native coronary artery without angina pectoris: Secondary | ICD-10-CM | POA: Diagnosis present

## 2022-06-19 DIAGNOSIS — E785 Hyperlipidemia, unspecified: Secondary | ICD-10-CM | POA: Diagnosis present

## 2022-06-19 DIAGNOSIS — G8929 Other chronic pain: Secondary | ICD-10-CM | POA: Diagnosis present

## 2022-06-19 DIAGNOSIS — Z1152 Encounter for screening for COVID-19: Secondary | ICD-10-CM | POA: Diagnosis not present

## 2022-06-19 DIAGNOSIS — N50819 Testicular pain, unspecified: Secondary | ICD-10-CM | POA: Diagnosis present

## 2022-06-19 DIAGNOSIS — R339 Retention of urine, unspecified: Secondary | ICD-10-CM | POA: Diagnosis present

## 2022-06-19 DIAGNOSIS — J9611 Chronic respiratory failure with hypoxia: Secondary | ICD-10-CM | POA: Diagnosis present

## 2022-06-19 DIAGNOSIS — F02818 Dementia in other diseases classified elsewhere, unspecified severity, with other behavioral disturbance: Secondary | ICD-10-CM | POA: Diagnosis present

## 2022-06-19 DIAGNOSIS — Z9981 Dependence on supplemental oxygen: Secondary | ICD-10-CM | POA: Diagnosis not present

## 2022-06-19 DIAGNOSIS — I2699 Other pulmonary embolism without acute cor pulmonale: Secondary | ICD-10-CM | POA: Diagnosis present

## 2022-06-19 DIAGNOSIS — E1165 Type 2 diabetes mellitus with hyperglycemia: Secondary | ICD-10-CM | POA: Diagnosis present

## 2022-06-19 DIAGNOSIS — G20A1 Parkinson's disease without dyskinesia, without mention of fluctuations: Secondary | ICD-10-CM | POA: Diagnosis present

## 2022-06-19 DIAGNOSIS — D649 Anemia, unspecified: Secondary | ICD-10-CM | POA: Diagnosis present

## 2022-06-19 DIAGNOSIS — T83511A Infection and inflammatory reaction due to indwelling urethral catheter, initial encounter: Secondary | ICD-10-CM | POA: Diagnosis present

## 2022-06-19 DIAGNOSIS — N390 Urinary tract infection, site not specified: Secondary | ICD-10-CM | POA: Diagnosis present

## 2022-06-19 DIAGNOSIS — E1140 Type 2 diabetes mellitus with diabetic neuropathy, unspecified: Secondary | ICD-10-CM | POA: Diagnosis present

## 2022-06-19 LAB — CBC
HCT: 30.9 % — ABNORMAL LOW (ref 39.0–52.0)
Hemoglobin: 9.6 g/dL — ABNORMAL LOW (ref 13.0–17.0)
MCH: 27.7 pg (ref 26.0–34.0)
MCHC: 31.1 g/dL (ref 30.0–36.0)
MCV: 89.3 fL (ref 80.0–100.0)
Platelets: 355 10*3/uL (ref 150–400)
RBC: 3.46 MIL/uL — ABNORMAL LOW (ref 4.22–5.81)
RDW: 15.7 % — ABNORMAL HIGH (ref 11.5–15.5)
WBC: 7.7 10*3/uL (ref 4.0–10.5)
nRBC: 0 % (ref 0.0–0.2)

## 2022-06-19 LAB — COMPREHENSIVE METABOLIC PANEL
ALT: 5 U/L (ref 0–44)
AST: 13 U/L — ABNORMAL LOW (ref 15–41)
Albumin: 2.3 g/dL — ABNORMAL LOW (ref 3.5–5.0)
Alkaline Phosphatase: 82 U/L (ref 38–126)
Anion gap: 8 (ref 5–15)
BUN: 13 mg/dL (ref 8–23)
CO2: 29 mmol/L (ref 22–32)
Calcium: 8.3 mg/dL — ABNORMAL LOW (ref 8.9–10.3)
Chloride: 101 mmol/L (ref 98–111)
Creatinine, Ser: 0.67 mg/dL (ref 0.61–1.24)
GFR, Estimated: >60 mL/min (ref >60)
Glucose, Bld: 121 mg/dL — ABNORMAL HIGH (ref 70–99)
Potassium: 3.8 mmol/L (ref 3.5–5.1)
Sodium: 138 mmol/L (ref 135–145)
Total Bilirubin: 0.4 mg/dL (ref 0.3–1.2)
Total Protein: 5.7 g/dL — ABNORMAL LOW (ref 6.5–8.1)

## 2022-06-19 LAB — GLUCOSE, CAPILLARY
Glucose-Capillary: 172 mg/dL — ABNORMAL HIGH (ref 70–99)
Glucose-Capillary: 159 mg/dL — ABNORMAL HIGH (ref 70–99)

## 2022-06-19 MED ORDER — CARBIDOPA-LEVODOPA ER 50-200 MG PO TBCR: 1.0000 | EXTENDED_RELEASE_TABLET | Freq: Every day | ORAL | 0 refills | Status: AC

## 2022-06-19 MED ORDER — HYOSCYAMINE SULFATE 0.125 MG SL SUBL: 0.1250 mg | SUBLINGUAL_TABLET | SUBLINGUAL | 0 refills | Status: DC | PRN

## 2022-06-19 MED ORDER — MIRABEGRON ER 25 MG PO TB24: 25.0000 mg | ORAL_TABLET | Freq: Every day | ORAL | 1 refills | Status: DC

## 2022-06-19 MED ORDER — APIXABAN 5 MG PO TABS: 10.0000 mg | ORAL_TABLET | Freq: Two times a day (BID) | ORAL | 0 refills | Status: DC

## 2022-06-19 MED ORDER — GABAPENTIN 300 MG PO CAPS: 600.0000 mg | ORAL_CAPSULE | Freq: Two times a day (BID) | ORAL | 0 refills | Status: AC

## 2022-06-19 MED ORDER — LACTINEX PO CHEW: 1.0000 | CHEWABLE_TABLET | Freq: Three times a day (TID) | ORAL | 0 refills | Status: AC

## 2022-06-19 MED ORDER — BISACODYL 5 MG PO TBEC: 5.0000 mg | DELAYED_RELEASE_TABLET | Freq: Every day | ORAL | 0 refills | Status: DC

## 2022-06-19 MED ORDER — GABAPENTIN 300 MG PO CAPS: 900.0000 mg | ORAL_CAPSULE | Freq: Every day | ORAL | 0 refills | Status: DC

## 2022-06-19 MED ORDER — APIXABAN 5 MG PO TABS: 5.0000 mg | ORAL_TABLET | Freq: Two times a day (BID) | ORAL | 1 refills | Status: DC

## 2022-06-19 MED ORDER — ATORVASTATIN CALCIUM 40 MG PO TABS: 40.0000 mg | ORAL_TABLET | Freq: Every day | ORAL | 0 refills | Status: AC

## 2022-06-19 MED ORDER — LEVOFLOXACIN 750 MG PO TABS: 750.0000 mg | ORAL_TABLET | Freq: Every day | ORAL | 0 refills | Status: AC

## 2022-06-19 NOTE — TOC Transition Note (Signed)
Transition of Care [facility]) - CM/SW Discharge Note   Patient Details  Name: Aaron Leon MRN: [id number] Date of Birth: [birth year]  Transition of Care [facility]) CM/SW Contact:  Iona Beard, [place] Phone Number: 06/19/2022, 11:48 AM   Clinical Narrative:    CSW updated pt is medically ready for D/C home today. CSW updated Caryl Pina with [facility] that pt will D/C today. HH orders have been placed by MD. TOC signing off.   Final next level of care: Home w Home Health Services Barriers to Discharge: Barriers Resolved   Patient Goals and CMS Choice CMS Medicare.gov Compare Post Acute Care list provided to:: Patient Represenative (must comment) Choice offered to / list presented to : Spouse  Discharge Placement                         Discharge Plan and Services Additional resources added to the After Visit Summary for   In-house Referral: Clinical Social Work Discharge Planning Services: CM Consult Post Acute Care Choice: Home Health                    HH Arranged: RN, OT, PT [facility] Agency: [place] ([place]) Date [facility] Agency Contacted: 06/19/22   Representative spoke with at [place]: [place] ([place]) Interventions SDOH Screenings   Food Insecurity: No Food Insecurity (06/17/2022)  Housing: Low Risk  (06/17/2022)  Transportation Needs: No Transportation Needs (06/17/2022)  Utilities: Not At Risk (06/18/2022)  Tobacco Use: Medium Risk (06/18/2022)     Readmission Risk Interventions     No data to display

## 2022-06-19 NOTE — Discharge Summary (Signed)
Physician Discharge Summary   Patient: Aaron Leon MRN: [id number] DOB: 06/23/1950  Admit date:     06/17/2022  Discharge date: 06/19/22  Discharge Physician: Deatra James   PCP: [facility]   Recommendations at discharge:  -Continue current medication including newly prescribed Apixaban-monitor for for bleeding, easily bruising Avoid NSAIDs if possible along with NSAIDs, may take Tylenol Follow-up with a urologist for further management of Foley cath [place] [facility] OT, ambulation, fall precaution - Urinary retention: We discussed the management of urinary retention with the patient and family and due to his limited mobility Urology has agreed that the foley should remain in place.  Bladder spasms: - lower back pain may be related to bladder spasms. He will trial Mirabegron 25mg  daily and Levsin 0.125mg  tabs q4 hours prn Continue Po antibiotics Levaquin based on previous urine culture of Pseudomonas, and pneumonia  Discharge Diagnoses: Principal Problem:   Pulmonary embolism ([place]) Active Problems:   Lobar pneumonia ([place])   Testicular pain   Diabetes mellitus ([place])   Essential hypertension   Dementia with behavioral disturbance ([place])   History of CVA (cerebrovascular accident)   Hyperlipidemia   Debility   Urinary retention  Resolved Problems:   * No resolved hospital problems. *  Hospital Course: Aaron Leon is a a 72 year old male with a significant history of HTN, HLD, chronic urinary retention, chronic anemia, COPD, DM 2, CVA, cognitive impairment (dementia with??Parkinson disease) Recent hospitalization this month for pneumonia.  After first admission 2 months ago was sent home with supplemental oxygen. Was treated at [facility] facility for UTI and pneumonia 5 days ago.[name]  History of chronic Foley catheter placed 2 months -was last changed in January 2024. Was not changed on last hospitalization. Patient was discharged on Augmentin and Levaquin 2 days  ago.  Presenting today with testicular pain.  Patient and his wife his symptoms started last night-bilateral testicular pain, wife noted of sediment in his Foley catheter.  Wife reports he does not look well.  He seen weak overall, poor appetite, with poor p.o. intake solids and liquids.... Reporting he is not ambulatory at baseline -previous history of hip fractures, deconditioning multiple hospitalizations pivots at baseline, uses wheelchair.  Has motorized wheelchair at home.    ED evaluation/course: Blood pressure (!) 148/74, pulse 73, Temp. 97.7 F (36.5 C), temperature source Oral, RR (!) 26, weight 73 kg, SpO2 94 %.  On 2 L of oxygen  CBC WBC 9.9, hemoglobin 10.4, platelets 411, CMP sodium 139, potassium 3.9, BUN 10, creatinine 0.58, calcium 8.2, troponin 3, glucose 165, lactic acid 1.3, magnesium 1.4 UA: Cloudy, hemoglobin, moderate leukocyte, bacteria none, UA WBC>50  CTA: Abdomen pelvis/chest And pelvis organs within normal limits, Chest small PE peripheral left lower lobe branch artery Multiple ill-defined patchy opacities are noted in both lungs suggesting multifocal pneumonia.  Small left pleural effusion subsegmental atelectasis.  Extensive CAD   Patient started on IV fluids, broad-spectrum antibiotics of cefepime and doxycycline  Blood and Urine cultures have been obtained, IVF, heparin     * Pulmonary embolism (HCC) Asymptomatic, denies any chest pain or shortness of breath SpO2 94 %.  On 2 L of oxygen -at baseline  -Initially patient was started on heparin drip, switching to p.o. Eliquis on 06/17/2022  -Will obtain 2D echocardiogram >> reviewed within normal limits, preserved ejection fraction Right heart strain -Obtaining lower extremity Doppler studies -for acute DVT  Testicular pain -along with lower back pain may be associated bladder spasm -Epididymitis  versus acute on chronic UTI mentation -Improved with Foley catheter exchanged in ED -Continue to  monitor closely -Ultrasound revealed negative for any testicular torsion MPRESSION::  There is no evidence of testicular torsion. There is 5 mm cyst in the left testis. No other focal abnormalities are seen in both testes. Small bilateral hydrocele.  Neurologist Dr. Alyson Ingles was consulted-discussed with patient and his wife Urinary retention: We discussed the management of urinary retention with the patient and family and due to his limited mobility we agreed that the foley should remain in place. Bladder spasms: the patient lower back pain may be related to bladder spasms. He will trial mirabegron 25mg  daily and levsin 0.125mg  tabs q4 hours prn  Lobar pneumonia ([place]) -Ruling out SIRS, sepsis -Was treated for pneumonia and UTI at [facility] Current vitals -Cultures remain no growth to date  Cefepime has switched to p.o. Levaquin per previous history of complicated UTI Pseudomonas and pneumonia  CBC WBC 9.9,  lactic acid 1.3,  CTA: Abdomen pelvis/chest And pelvis organs within normal limits, Chest small PE peripheral left lower lobe branch artery Multiple ill-defined patchy opacities are noted in both lungs suggesting multifocal pneumonia.  Small left pleural effusion subsegmental atelectasis.  Extensive CAD   Status post IV fluid hydration, discontinuing IV fluids, encourage oral hydration  On  Broad-spectrum antibiotics of Cefepime and doxycycline  Blood and urine cultures >>> reporting no growth   History of CVA (cerebrovascular accident) - Continue home dose aspirin, statins,  Dementia with behavioral disturbance (HCC) Multifactorial  History of Parkinson dementia -progressive neurological disorder, currently severe debility neuropathy  -Home medications reviewed, on Sinemet, Namenda, galantamine   Essential hypertension - Mildly hypertensive, but holding home medication of Norvasc, lisinopril, Lasix, continue with metoprolol   Diabetes mellitus ([place]) Continue home  medication of metformin -Continue home medication of Levemir -Checking A1c 7.2  Urinary retention - Chronic Foley catheter exchanged in ED on 06/18/2022  -Possible neurogenic bladder, chronic Foley catheter has been placed previous hospitalization for past 2 months, last changed in January 2024 per patient and his wife -Recent hospitalization at [facility] few days ago Foley catheter was not changed  - Testicular pain improved  -Continue home medication of Flomax,  - Oxybutynin -discontinued  -Status post urology evaluation, recommending continue Foley catheter, Bladder spasms: the patient lower back pain may be related to bladder spasms. He will trial Mirabegron 25mg  daily and Levsin 0.125mg  tabs q4 hours prn  Debility -Acute on chronic severe debility, at baseline ambulates with wheelchair, motorized wheelchair at home -Unknown underlying etiology: neuromuscular versus h/o stroke,  prolonged chronic illness progressing to debility, prolonged hospitalizations associate with neuropathy  -According to his wife he had a hip fracture surgery this past January he has not been able to ambulate since  -Consulting PT/OT -continue home health PT w   Hyperlipidemia - On high-dose statin Lipitor 80 mg daily--due to progressive generalized weakness,  reduce the dose to 40 mg daily     Consultants: Urologist Procedures performed: Exchange of chronic Foley catheter in the ED Disposition: Home Diet recommendation:  Discharge Diet Orders (From admission, onward)     Start     Ordered   06/19/22 0000  Diet - low sodium heart healthy        06/19/22 0731           Clear liquid diet, advance as tolerated DISCHARGE MEDICATION: Allergies as of 06/19/2022       Reactions   Barbiturates Other (See Comments)  Blacks out Blacks out   Seroquel  [quetiapine]    Other reaction(s): Hallucination   Shellfish Allergy Hives        Medication List     STOP taking these medications     methocarbamol 500 MG tablet Commonly known as: ROBAXIN   oxybutynin 5 MG tablet Commonly known as: DITROPAN       TAKE these medications    acetaminophen 500 MG tablet Commonly known as: TYLENOL Take 500 mg by mouth in the morning and at bedtime.   albuterol 108 (90 Base) MCG/ACT inhaler Commonly known as: VENTOLIN HFA Inhale 1 puff into the lungs every 6 (six) hours as needed for wheezing or shortness of breath.   albuterol (2.5 MG/3ML) 0.083% nebulizer solution Commonly known as: PROVENTIL Take 2.5 mg by nebulization every 6 (six) hours as needed for wheezing or shortness of breath.   amLODipine 5 MG tablet Commonly known as: NORVASC Take 5 mg by mouth daily.   apixaban 5 MG Tabs tablet Commonly known as: ELIQUIS Take 2 tablets (10 mg total) by mouth 2 (two) times daily for 6 days.   apixaban 5 MG Tabs tablet Commonly known as: ELIQUIS Take 1 tablet (5 mg total) by mouth 2 (two) times daily. Start taking on: June 24, 2022   ascorbic acid 500 MG tablet Commonly known as: VITAMIN C Take 500 mg by mouth daily.   aspirin 325 MG tablet Take 325 mg by mouth daily.   atorvastatin 40 MG tablet Commonly known as: LIPITOR Take 1 tablet (40 mg total) by mouth at bedtime. What changed:  medication strength how much to take   baclofen 10 MG tablet Commonly known as: LIORESAL Take 10-20 mg by mouth 2 (two) times daily.   bisacodyl 5 MG EC tablet Commonly known as: DULCOLAX Take 1 tablet (5 mg total) by mouth daily.   budesonide-formoterol 80-4.5 MCG/ACT inhaler Commonly known as: SYMBICORT Inhale 2 puffs into the lungs 2 (two) times daily.   carbidopa-levodopa 25-100 MG tablet Commonly known as: SINEMET IR Take 2 tablets by mouth 3 (three) times daily.   carbidopa-levodopa 50-200 MG tablet Commonly known as: SINEMET CR Take 1 tablet by mouth at bedtime.   cetirizine 10 MG tablet Commonly known as: ZYRTEC Take 10 mg by mouth in the morning.    cholecalciferol 25 MCG (1000 UNIT) tablet Commonly known as: VITAMIN D3 Take 1,000 Units by mouth in the morning and at bedtime.   ferrous sulfate 325 (65 FE) MG EC tablet Take 325 mg by mouth in the morning.   furosemide 20 MG tablet Commonly known as: LASIX Take 5 mg by mouth daily.   gabapentin 300 MG capsule Commonly known as: NEURONTIN Take 2 capsules (600 mg total) by mouth 2 (two) times daily with breakfast and lunch. What changed:  how much to take when to take this additional instructions   gabapentin 300 MG capsule Commonly known as: NEURONTIN Take 3 capsules (900 mg total) by mouth at bedtime. What changed: You were already taking a medication with the same name, and this prescription was added. Make sure you understand how and when to take each.   galantamine 24 MG 24 hr capsule Commonly known as: RAZADYNE ER Take 24 mg by mouth every evening.   hyoscyamine 0.125 MG SL tablet Commonly known as: LEVSIN SL Place 1 tablet (0.125 mg total) under the tongue every 4 (four) hours as needed for cramping.   insulin aspart 100 UNIT/ML injection Commonly known as: novoLOG  Inject 1-2 Units into the skin 3 (three) times daily before meals. Per sliding scale   insulin detemir 100 UNIT/ML injection Commonly known as: LEVEMIR Inject 12 Units into the skin every morning.   lactobacillus acidophilus & bulgar chewable tablet Chew 1 tablet by mouth 3 (three) times daily with meals for 7 days.   levofloxacin 750 MG tablet Commonly known as: LEVAQUIN Take 1 tablet (750 mg total) by mouth daily for 5 days.   lisinopril 10 MG tablet Commonly known as: ZESTRIL Take 5 mg by mouth daily.   magnesium oxide 400 (240 Mg) MG tablet Commonly known as: MAG-OX Take 400-800 mg by mouth daily. 2 tablets in the morning and 1 tablet in the evening   melatonin 3 MG Tabs tablet Take 3 mg by mouth at bedtime.   memantine 10 MG tablet Commonly known as: NAMENDA Take 10 mg by mouth 2  (two) times daily.   metFORMIN 1000 MG tablet Commonly known as: GLUCOPHAGE Take 1,000 mg by mouth 2 (two) times daily with a meal.   metoprolol tartrate 25 MG tablet Commonly known as: LOPRESSOR Take 25 mg by mouth 2 (two) times daily.   mirabegron ER 25 MG Tb24 tablet Commonly known as: MYRBETRIQ Take 1 tablet (25 mg total) by mouth daily.   omeprazole 20 MG capsule Commonly known as: PRILOSEC Take 20 mg by mouth in the morning.   oxyCODONE-acetaminophen 10-325 MG tablet Commonly known as: PERCOCET Take 1 tablet by mouth every 6 (six) hours as needed for pain.   polyethylene glycol powder 17 GM/SCOOP powder Commonly known as: GLYCOLAX/MIRALAX Take 17 g by mouth daily as needed for mild constipation or moderate constipation.   sertraline 100 MG tablet Commonly known as: ZOLOFT Take 100 mg by mouth in the morning.   tamsulosin 0.4 MG Caps capsule Commonly known as: FLOMAX Take 0.4 mg by mouth at bedtime.   tiotropium 18 MCG inhalation capsule Commonly known as: SPIRIVA Place 18 mcg into inhaler and inhale in the morning.   zolpidem 10 MG tablet Commonly known as: AMBIEN Take 10 mg by mouth at bedtime.        Discharge Exam: Filed Weights   06/17/22 [id number] 06/18/22 0527 06/19/22 0500  Weight: 73 kg 70.5 kg 69.8 kg        General:  AAO x 3,  cooperative, no distress;   HEENT:  Normocephalic, PERRL, otherwise with in Normal limits   Neuro:  CNII-XII intact. , normal motor and sensation, reflexes intact   Lungs:   Clear to auscultation BL, Respirations unlabored,  No wheezes / crackles  Cardio:    S1/S2, RRR, No murmure, No Rubs or Gallops   Abdomen:  Soft, non-tender, bowel sounds active all four quadrants, no guarding or peritoneal signs.  Muscular  skeletal:  Limited exam -global generalized weaknesses - in bed, able to move all 4 extremities,   2+ pulses,  symmetric, No pitting edema  Skin:  Dry, warm to touch, negative for any Rashes,  Wounds: Please  see nursing documentation          Condition at discharge: good  The results of significant diagnostics from this hospitalization (including imaging, microbiology, ancillary and laboratory) are listed below for reference.   Imaging Studies: US Venous Img Lower Bilateral (DVT)  Result Date: 06/18/2022 CLINICAL DATA:  Lower extremity pain, history of pulmonary embolus EXAM: BILATERAL LOWER EXTREMITY VENOUS DOPPLER ULTRASOUND TECHNIQUE: Gray-scale sonography with compression, as well as color and duplex ultrasound, were performed to  evaluate the deep venous system(s) from the level of the common femoral vein through the popliteal and proximal calf veins. COMPARISON:  None Available. FINDINGS: VENOUS Normal compressibility of the common femoral, superficial femoral, and popliteal veins, as well as the visualized calf veins. Visualized portions of profunda femoral vein and great saphenous vein unremarkable. No filling defects to suggest DVT on grayscale or color Doppler imaging. Doppler waveforms show normal direction of venous flow, normal respiratory plasticity and response to augmentation. OTHER None. Limitations: none IMPRESSION: 1. No evidence of deep venous thrombosis within either lower extremity. Electronically Signed   By: Randa Ngo M.D.   On: 06/18/2022 13:31   ECHOCARDIOGRAM COMPLETE  Result Date: 06/18/2022    ECHOCARDIOGRAM REPORT   Patient Name:   IDRISS ROBELLO [facility] Date of Exam: 06/18/2022 Medical Rec #:  [id number]         Height:       72.0 in Accession #:    [id number]        Weight:       155.4 lb Date of Birth:  [birth year]          BSA:          1.914 m Patient Age:    60 years          BP:           107/48 mmHg Patient Gender: M                 HR:           60 bpm. Exam Location:  [facility] Procedure: 2D Echo, Color Doppler and Cardiac Doppler Indications:    Dyspnea R06.00  History:        Patient has no prior history of Echocardiogram examinations.                 Stroke,  Signs/Symptoms:Altered Mental Status; Risk                 Factors:Diabetes and Dyslipidemia.  Sonographer:    Greer Pickerel Referring Phys: [phone] Arma Reining A Mataio Mele  Sonographer Comments: No subcostal window. Image acquisition challenging due to patient body habitus and Image acquisition challenging due to respiratory motion. IMPRESSIONS  1. Left ventricular ejection fraction, by estimation, is 65 to 70%. The left ventricle has normal function. The left ventricle has no regional wall motion abnormalities. Left ventricular diastolic parameters are consistent with Grade I diastolic dysfunction (impaired relaxation).  2. Right ventricular systolic function is normal. The right ventricular size is normal. Tricuspid regurgitation signal is inadequate for assessing PA pressure.  3. Left atrial size was mildly dilated.  4. The mitral valve is grossly normal. Trivial mitral valve regurgitation.  5. The aortic valve is tricuspid. There is mild calcification of the aortic valve. Aortic valve regurgitation is mild. Aortic valve sclerosis/calcification is present, without any evidence of aortic stenosis. Aortic regurgitation PHT measures 804 msec.  6. Aortic dilatation noted. There is mild dilatation of the ascending aorta, measuring 42 mm. There is borderline dilatation of the aortic root, measuring 39 mm.  7. Unable to estimate CVP. Comparison(s): No prior Echocardiogram. FINDINGS  Left Ventricle: Left ventricular ejection fraction, by estimation, is 65 to 70%. The left ventricle has normal function. The left ventricle has no regional wall motion abnormalities. The left ventricular internal cavity size was normal in size. There is  no left ventricular hypertrophy. Left ventricular diastolic parameters are consistent with Grade I diastolic dysfunction (impaired relaxation). Right Ventricle:  The right ventricular size is normal. No increase in right ventricular wall thickness. Right ventricular systolic function is normal.  Tricuspid regurgitation signal is inadequate for assessing PA pressure. Left Atrium: Left atrial size was mildly dilated. Right Atrium: Right atrial size was normal in size. Pericardium: There is no evidence of pericardial effusion. Mitral Valve: The mitral valve is grossly normal. Trivial mitral valve regurgitation. Tricuspid Valve: The tricuspid valve is grossly normal. Tricuspid valve regurgitation is trivial. Aortic Valve: The aortic valve is tricuspid. There is mild calcification of the aortic valve. There is mild aortic valve annular calcification. Aortic valve regurgitation is mild. Aortic regurgitation PHT measures 804 msec. Aortic valve sclerosis/calcification is present, without any evidence of aortic stenosis. Pulmonic Valve: The pulmonic valve was not well visualized. Pulmonic valve regurgitation is trivial. Aorta: Aortic dilatation noted. There is mild dilatation of the ascending aorta, measuring 42 mm. There is borderline dilatation of the aortic root, measuring 39 mm. Venous: Unable to estimate CVP. The inferior vena cava was not well visualized. IAS/Shunts: No atrial level shunt detected by color flow Doppler.  LEFT VENTRICLE PLAX 2D LVIDd:         4.90 cm   Diastology LVIDs:         3.10 cm   LV e' medial:    6.42 cm/s LV PW:         1.00 cm   LV E/e' medial:  11.8 LV IVS:        0.70 cm   LV e' lateral:   9.32 cm/s LVOT diam:     2.30 cm   LV E/e' lateral: 8.1 LV SV:         108 LV SV Index:   56 LVOT Area:     4.15 cm  RIGHT VENTRICLE RV S prime:     8.70 cm/s TAPSE (M-mode): 1.9 cm LEFT ATRIUM             Index        RIGHT ATRIUM           Index LA diam:        4.10 cm 2.14 cm/m   RA Area:     18.80 cm LA Vol (A2C):   68.8 ml 35.94 ml/m  RA Volume:   44.90 ml  23.46 ml/m LA Vol (A4C):   60.4 ml 31.56 ml/m LA Biplane Vol: 64.5 ml 33.70 ml/m  AORTIC VALVE             PULMONIC VALVE LVOT Vmax:   112.00 cm/s PR End Diast Vel: 1.02 msec LVOT Vmean:  74.900 cm/s LVOT VTI:    0.260 m AI PHT:       804 msec  AORTA Ao Root diam: 3.90 cm Ao Asc diam:  4.20 cm MITRAL VALVE               TRICUSPID VALVE MV Area (PHT): 2.32 cm    TR Peak grad:   3.4 mmHg MV Decel Time: 327 msec    TR Vmax:        92.10 cm/s MV E velocity: 75.50 cm/s MV A velocity: 91.90 cm/s  SHUNTS MV E/A ratio:  0.82        Systemic VTI:  0.26 m                            Systemic Diam: 2.30 cm Rozann Lesches MD Electronically signed by Rozann Lesches MD Signature Date/Time:  06/18/2022/11:54:10 AM    Final    CT Angio Chest PE W and/or Wo Contrast  Result Date: 06/17/2022 CLINICAL DATA:  High probability of pulmonary embolus. Testicular pain. EXAM: CT ANGIOGRAPHY CHEST CT ABDOMEN AND PELVIS WITH CONTRAST TECHNIQUE: Multidetector CT imaging of the chest was performed using the standard protocol during bolus administration of intravenous contrast. Multiplanar CT image reconstructions and MIPs were obtained to evaluate the vascular anatomy. Multidetector CT imaging of the abdomen and pelvis was performed using the standard protocol during bolus administration of intravenous contrast. RADIATION DOSE REDUCTION: This exam was performed according to the departmental dose-optimization program which includes automated exposure control, adjustment of the mA and/or kV according to patient size and/or use of iterative reconstruction technique. CONTRAST:  191mL OMNIPAQUE IOHEXOL 350 MG/ML SOLN COMPARISON:  July 08, 2018. FINDINGS: CTA CHEST FINDINGS Cardiovascular: There is a very small filling defect seen in a peripheral lower lobe branch of the left pulmonary artery best seen on image number 152 of series 6. Normal cardiac size. No pericardial effusion. Extensive coronary artery calcifications are noted. Mediastinum/Nodes: Small sliding-type hiatal hernia. No adenopathy. Thyroid gland is unremarkable. Lungs/Pleura: No pneumothorax is noted. Small left pleural effusion is noted with adjacent subsegmental atelectasis. Multiple patchy airspace  opacities are noted throughout both lungs, most prominently seen in both upper lobes posteriorly, suggesting multifocal pneumonia. Musculoskeletal: Old right rib fractures are noted. No acute osseous abnormality is noted. Review of the MIP images confirms the above findings. CT ABDOMEN and PELVIS FINDINGS Hepatobiliary: No focal liver abnormality is seen. No gallstones, gallbladder wall thickening, or biliary dilatation. Pancreas: Unremarkable. No pancreatic ductal dilatation or surrounding inflammatory changes. Spleen: Normal in size without focal abnormality. Adrenals/Urinary Tract: Adrenal glands and kidneys appear normal. No hydronephrosis or renal obstruction is noted. Urinary bladder is decompressed secondary to Foley catheter. Stomach/Bowel: Stomach is within normal limits. Appendix appears normal. No evidence of bowel wall thickening, distention, or inflammatory changes. Vascular/Lymphatic: Aortic atherosclerosis. No enlarged abdominal or pelvic lymph nodes. Reproductive: Prostate gland is not well visualized due to scatter artifact arising from right hip prosthesis. Other: No abdominal wall hernia or abnormality. No abdominopelvic ascites. Musculoskeletal: Status post right total hip arthroplasty. Status post surgical internal fixation of old proximal left femoral fracture. Probable old T12 fracture is noted. No acute osseous abnormality is noted. Review of the MIP images confirms the above findings. IMPRESSION: Small pulmonary embolus seen in peripheral lower lobe branch of left pulmonary artery. Critical Value/emergent results were called by telephone at the time of interpretation on 06/17/2022 at 1:12 pm to provider Godfrey Pick , who verbally acknowledged these results. Multiple ill-defined patchy opacities are noted in both lungs suggesting multifocal pneumonia. Small left pleural effusion is noted with minimal adjacent subsegmental atelectasis. Extensive coronary artery calcifications are noted  suggesting coronary disease. Small sliding-type hiatal hernia. No acute abnormality seen in the abdomen or pelvis. Aortic Atherosclerosis (ICD10-I70.0). Electronically Signed   By: Marijo Conception M.D.   On: 06/17/2022 13:12   CT ABDOMEN PELVIS W CONTRAST  Result Date: 06/17/2022 CLINICAL DATA:  High probability of pulmonary embolus. Testicular pain. EXAM: CT ANGIOGRAPHY CHEST CT ABDOMEN AND PELVIS WITH CONTRAST TECHNIQUE: Multidetector CT imaging of the chest was performed using the standard protocol during bolus administration of intravenous contrast. Multiplanar CT image reconstructions and MIPs were obtained to evaluate the vascular anatomy. Multidetector CT imaging of the abdomen and pelvis was performed using the standard protocol during bolus administration of intravenous contrast. RADIATION DOSE REDUCTION: This  exam was performed according to the departmental dose-optimization program which includes automated exposure control, adjustment of the mA and/or kV according to patient size and/or use of iterative reconstruction technique. CONTRAST:  123mL OMNIPAQUE IOHEXOL 350 MG/ML SOLN COMPARISON:  July 08, 2018. FINDINGS: CTA CHEST FINDINGS Cardiovascular: There is a very small filling defect seen in a peripheral lower lobe branch of the left pulmonary artery best seen on image number 152 of series 6. Normal cardiac size. No pericardial effusion. Extensive coronary artery calcifications are noted. Mediastinum/Nodes: Small sliding-type hiatal hernia. No adenopathy. Thyroid gland is unremarkable. Lungs/Pleura: No pneumothorax is noted. Small left pleural effusion is noted with adjacent subsegmental atelectasis. Multiple patchy airspace opacities are noted throughout both lungs, most prominently seen in both upper lobes posteriorly, suggesting multifocal pneumonia. Musculoskeletal: Old right rib fractures are noted. No acute osseous abnormality is noted. Review of the MIP images confirms the above findings. CT  ABDOMEN and PELVIS FINDINGS Hepatobiliary: No focal liver abnormality is seen. No gallstones, gallbladder wall thickening, or biliary dilatation. Pancreas: Unremarkable. No pancreatic ductal dilatation or surrounding inflammatory changes. Spleen: Normal in size without focal abnormality. Adrenals/Urinary Tract: Adrenal glands and kidneys appear normal. No hydronephrosis or renal obstruction is noted. Urinary bladder is decompressed secondary to Foley catheter. Stomach/Bowel: Stomach is within normal limits. Appendix appears normal. No evidence of bowel wall thickening, distention, or inflammatory changes. Vascular/Lymphatic: Aortic atherosclerosis. No enlarged abdominal or pelvic lymph nodes. Reproductive: Prostate gland is not well visualized due to scatter artifact arising from right hip prosthesis. Other: No abdominal wall hernia or abnormality. No abdominopelvic ascites. Musculoskeletal: Status post right total hip arthroplasty. Status post surgical internal fixation of old proximal left femoral fracture. Probable old T12 fracture is noted. No acute osseous abnormality is noted. Review of the MIP images confirms the above findings. IMPRESSION: Small pulmonary embolus seen in peripheral lower lobe branch of left pulmonary artery. Critical Value/emergent results were called by telephone at the time of interpretation on 06/17/2022 at 1:12 pm to provider Godfrey Pick , who verbally acknowledged these results. Multiple ill-defined patchy opacities are noted in both lungs suggesting multifocal pneumonia. Small left pleural effusion is noted with minimal adjacent subsegmental atelectasis. Extensive coronary artery calcifications are noted suggesting coronary disease. Small sliding-type hiatal hernia. No acute abnormality seen in the abdomen or pelvis. Aortic Atherosclerosis (ICD10-I70.0). Electronically Signed   By: Marijo Conception M.D.   On: 06/17/2022 13:12   DG Chest Port 1 View  Result Date: 06/17/2022 CLINICAL  DATA:  Questionable sepsis - evaluate for abnormality EXAM: PORTABLE CHEST 1 VIEW COMPARISON:  Radiograph 04/20/2022, chest CT 07/08/2018 FINDINGS: Unchanged cardiomediastinal silhouette. Diffuse interstitial prominence with new reticular and faint airspace opacities in the left upper lung, airspace opacities in the left medial lung base, and airspace disease in the peripheral right mid lung. Thoracic spondylosis. There is a distal left clavicle fracture which appears subacute. Old right rib fractures noted. IMPRESSION: New bilateral airspace disease in the left upper lung, left medial lung base, and right peripheral mid lung concerning for multifocal pneumonia. Recommend radiographic follow-up. Distal left clavicle fracture which appears subacute. Electronically Signed   By: Maurine Simmering M.D.   On: 06/17/2022 10:17   US SCROTUM W/DOPPLER  Result Date: 06/17/2022 CLINICAL DATA:  Scrotal pain EXAM: SCROTAL ULTRASOUND DOPPLER ULTRASOUND OF THE TESTICLES TECHNIQUE: Complete ultrasound examination of the testicles, epididymis, and other scrotal structures was performed. Color and spectral Doppler ultrasound were also utilized to evaluate blood flow to the testicles. COMPARISON:  None Available. FINDINGS: Right testicle Measurements: 2.7 x 2.8 x 2.7 cm. No mass or microlithiasis visualized. Left testicle Measurements: 3.4 x 3 x 2.6 cm. There is 5 mm cyst in the left testis. No other focal abnormalities are seen. Right epididymis:  Normal in size and appearance. Left epididymis:  Normal in size and appearance. Hydrocele:  Small bilateral hydrocele is present. Varicocele:  None visualized. Pulsed Doppler interrogation of both testes demonstrates normal low resistance arterial and venous waveforms bilaterally. IMPRESSION: There is no evidence of testicular torsion. There is 5 mm cyst in the left testis. No other focal abnormalities are seen in both testes. Small bilateral hydrocele. Electronically Signed   By: Elmer Picker M.D.   On: 06/17/2022 10:10    Microbiology: Results for orders placed or performed during the hospital encounter of 06/17/22  Resp panel by RT-PCR (RSV, Flu A&B, Covid) Anterior Nasal Swab     Status: None   Collection Time: 06/17/22  8:57 AM   Specimen: Anterior Nasal Swab  Result Value Ref Range Status   SARS Coronavirus 2 by RT PCR NEGATIVE NEGATIVE Final    Comment: (NOTE) SARS-CoV-2 target nucleic acids are NOT DETECTED.  The SARS-CoV-2 RNA is generally detectable in upper respiratory specimens during the acute phase of infection. The lowest concentration of SARS-CoV-2 viral copies this assay can detect is 138 copies/mL. A negative result does not preclude SARS-Cov-2 infection and should not be used as the sole basis for treatment or other patient management decisions. A negative result may occur with  improper specimen collection/handling, submission of specimen other than nasopharyngeal swab, presence of viral mutation(s) within the areas targeted by this assay, and inadequate number of viral copies(<138 copies/mL). A negative result must be combined with clinical observations, patient history, and epidemiological information. The expected result is Negative.  Fact Sheet for Patients:  EntrepreneurPulse.com.au  Fact Sheet for Healthcare Providers:  IncredibleEmployment.be  This test is no t yet approved or cleared by the [place] FDA and  has been authorized for detection and/or diagnosis of SARS-CoV-2 by FDA under an Emergency Use Authorization (EUA). This EUA will remain  in effect (meaning this test can be used) for the duration of the COVID-19 declaration under Section 564(b)(1) of the Act, 21 U.S.C.section 360bbb-3(b)(1), unless the authorization is terminated  or revoked sooner.       Influenza A by PCR NEGATIVE NEGATIVE Final   Influenza B by PCR NEGATIVE NEGATIVE Final    Comment: (NOTE) The Xpert Xpress  SARS-CoV-2/FLU/RSV plus assay is intended as an aid in the diagnosis of influenza from Nasopharyngeal swab specimens and should not be used as a sole basis for treatment. Nasal washings and aspirates are unacceptable for Xpert Xpress SARS-CoV-2/FLU/RSV testing.  Fact Sheet for Patients: EntrepreneurPulse.com.au  Fact Sheet for Healthcare Providers: IncredibleEmployment.be  This test is not yet approved or cleared by the [place] FDA and has been authorized for detection and/or diagnosis of SARS-CoV-2 by FDA under an Emergency Use Authorization (EUA). This EUA will remain in effect (meaning this test can be used) for the duration of the COVID-19 declaration under Section 564(b)(1) of the Act, 21 U.S.C. section 360bbb-3(b)(1), unless the authorization is terminated or revoked.     Resp Syncytial Virus by PCR NEGATIVE NEGATIVE Final    Comment: (NOTE) Fact Sheet for Patients: EntrepreneurPulse.com.au  Fact Sheet for Healthcare Providers: IncredibleEmployment.be  This test is not yet approved or cleared by the [place] FDA and has been authorized for detection  and/or diagnosis of SARS-CoV-2 by FDA under an Emergency Use Authorization (EUA). This EUA will remain in effect (meaning this test can be used) for the duration of the COVID-19 declaration under Section 564(b)(1) of the Act, 21 U.S.C. section 360bbb-3(b)(1), unless the authorization is terminated or revoked.  Performed at [facility], [address], [place], [place] [address]   Blood Culture (routine x 2)     Status: None (Preliminary result)   Collection Time: 06/17/22  9:01 AM   Specimen: BLOOD LEFT HAND  Result Value Ref Range Status   Specimen Description BLOOD LEFT HAND  Final   Special Requests   Final    BOTTLES DRAWN AEROBIC AND ANAEROBIC Blood Culture adequate volume   Culture   Final    NO GROWTH < 24 HOURS Performed at [facility], [address], [place], [place] [address]    Report Status PENDING  Incomplete  Urine Culture     Status: None   Collection Time: 06/17/22  9:15 AM   Specimen: Urine, Random  Result Value Ref Range Status   Specimen Description   Final    URINE, RANDOM Performed at [facility], [address], [place], [place] [address]    Special Requests   Final    NONE Reflexed from [id number] Performed at [facility], [address], [place], [place] [address]    Culture   Final    NO GROWTH Performed at [facility], [place] [address] [place], [place] [address]    Report Status 06/18/2022 FINAL  Final  Blood Culture (routine x 2)     Status: None (Preliminary result)   Collection Time: 06/17/22  9:28 AM   Specimen: BLOOD  Result Value Ref Range Status   Specimen Description BLOOD BLOOD RIGHT HAND  Final   Special Requests   Final    BOTTLES DRAWN AEROBIC AND ANAEROBIC Blood Culture adequate volume   Culture   Final    NO GROWTH < 24 HOURS Performed at [facility], [address] [place], [place] [address]    Report Status PENDING  Incomplete    Labs: CBC: Recent Labs  Lab 06/17/22 0901 06/18/22 0419 06/19/22 0416  WBC 9.9 8.3 7.7  NEUTROABS 8.0*  --   --   HGB 10.4* 9.7* 9.6*  HCT 33.2* 31.6* 30.9*  MCV 86.9 89.3 89.3  PLT 411* 395 [id number]   Basic Metabolic Panel: Recent Labs  Lab 06/17/22 0901 06/18/22 0419 06/19/22 0416  NA 139 138 138  K 3.9 4.2 3.8  CL 103 103 101  CO2 26 27 29   GLUCOSE 165* 152* 121*  BUN 10 12 13   CREATININE 0.58* 0.66 0.67  CALCIUM 8.2* 8.1* 8.3*  MG 1.4*  --   --   PHOS 3.0  --   --    Liver Function Tests: Recent Labs  Lab 06/17/22 0901 06/18/22 0419 06/19/22 0416  AST 12* 12* 13*  ALT 8 <5 5  ALKPHOS 86 83 82  BILITOT 0.5 0.4 0.4  PROT 6.1* 5.5* 5.7*  ALBUMIN 2.6* 2.3* 2.3*   CBG: Recent Labs  Lab 06/17/22 2025 06/18/22 0731 06/18/22 1140 06/18/22 1624 06/18/22 2047  GLUCAP 206* 142* 236* 188* 204*     Discharge time spent: greater than 50 minutes.  Signed: Deatra James, MD [facility] 06/19/2022

## 2022-06-22 ENCOUNTER — Other Ambulatory Visit: Payer: Self-pay

## 2022-06-22 ENCOUNTER — Encounter (HOSPITAL_COMMUNITY): Payer: Self-pay | Admitting: *Deleted

## 2022-06-22 ENCOUNTER — Emergency Department (HOSPITAL_COMMUNITY)
Admission: EM | Admit: 2022-06-22 | Discharge: 2022-06-22 | Disposition: A | Discharge status: Home / Self Care | Payer: No Typology Code available for payment source | Attending: Emergency Medicine | Admitting: Emergency Medicine

## 2022-06-22 DIAGNOSIS — R310 Gross hematuria: Secondary | ICD-10-CM | POA: Diagnosis present

## 2022-06-22 DIAGNOSIS — Z794 Long term (current) use of insulin: Secondary | ICD-10-CM | POA: Diagnosis not present

## 2022-06-22 DIAGNOSIS — J449 Chronic obstructive pulmonary disease, unspecified: Secondary | ICD-10-CM | POA: Diagnosis not present

## 2022-06-22 DIAGNOSIS — Z87891 Personal history of nicotine dependence: Secondary | ICD-10-CM | POA: Diagnosis not present

## 2022-06-22 DIAGNOSIS — I10 Essential (primary) hypertension: Secondary | ICD-10-CM | POA: Diagnosis not present

## 2022-06-22 DIAGNOSIS — E119 Type 2 diabetes mellitus without complications: Secondary | ICD-10-CM | POA: Insufficient documentation

## 2022-06-22 DIAGNOSIS — Z79899 Other long term (current) drug therapy: Secondary | ICD-10-CM | POA: Diagnosis not present

## 2022-06-22 DIAGNOSIS — Z7901 Long term (current) use of anticoagulants: Secondary | ICD-10-CM | POA: Diagnosis not present

## 2022-06-22 DIAGNOSIS — Z7984 Long term (current) use of oral hypoglycemic drugs: Secondary | ICD-10-CM | POA: Insufficient documentation

## 2022-06-22 LAB — COMPREHENSIVE METABOLIC PANEL
ALT: 9 U/L (ref 0–44)
AST: 13 U/L — ABNORMAL LOW (ref 15–41)
Albumin: 2.8 g/dL — ABNORMAL LOW (ref 3.5–5.0)
Alkaline Phosphatase: 99 U/L (ref 38–126)
Anion gap: 10 (ref 5–15)
BUN: 16 mg/dL (ref 8–23)
CO2: 28 mmol/L (ref 22–32)
Calcium: 8.7 mg/dL — ABNORMAL LOW (ref 8.9–10.3)
Chloride: 100 mmol/L (ref 98–111)
Creatinine, Ser: 0.84 mg/dL (ref 0.61–1.24)
GFR, Estimated: >60 mL/min (ref >60)
Glucose, Bld: 139 mg/dL — ABNORMAL HIGH (ref 70–99)
Potassium: 4.8 mmol/L (ref 3.5–5.1)
Sodium: 138 mmol/L (ref 135–145)
Total Bilirubin: 0.7 mg/dL (ref 0.3–1.2)
Total Protein: 6.5 g/dL (ref 6.5–8.1)

## 2022-06-22 LAB — CBC WITH DIFFERENTIAL/PLATELET
Abs Immature Granulocytes: 0.09 10*3/uL — ABNORMAL HIGH (ref 0.00–0.07)
Basophils Absolute: 0 10*3/uL (ref 0.0–0.1)
Basophils Relative: 0 %
Eosinophils Absolute: 0.1 10*3/uL (ref 0.0–0.5)
Eosinophils Relative: 1 %
HCT: 34.8 % — ABNORMAL LOW (ref 39.0–52.0)
Hemoglobin: 10.7 g/dL — ABNORMAL LOW (ref 13.0–17.0)
Immature Granulocytes: 1 %
Lymphocytes Relative: 11 %
Lymphs Abs: 1 10*3/uL (ref 0.7–4.0)
MCH: 27.5 pg (ref 26.0–34.0)
MCHC: 30.7 g/dL (ref 30.0–36.0)
MCV: 89.5 fL (ref 80.0–100.0)
Monocytes Absolute: 0.4 10*3/uL (ref 0.1–1.0)
Monocytes Relative: 5 %
Neutro Abs: 8 10*3/uL — ABNORMAL HIGH (ref 1.7–7.7)
Neutrophils Relative %: 82 %
Platelets: 327 10*3/uL (ref 150–400)
RBC: 3.89 MIL/uL — ABNORMAL LOW (ref 4.22–5.81)
RDW: 15.6 % — ABNORMAL HIGH (ref 11.5–15.5)
WBC: 9.6 10*3/uL (ref 4.0–10.5)
nRBC: 0 % (ref 0.0–0.2)

## 2022-06-22 LAB — CULTURE, BLOOD (ROUTINE X 2)
Culture: NO GROWTH
Culture: NO GROWTH
Special Requests: ADEQUATE
Special Requests: ADEQUATE

## 2022-06-22 LAB — TYPE AND SCREEN
ABO/RH(D): A NEG
Antibody Screen: NEGATIVE

## 2022-06-22 LAB — URINALYSIS, ROUTINE W REFLEX MICROSCOPIC
Bilirubin Urine: NEGATIVE
Glucose, UA: NEGATIVE mg/dL
Ketones, ur: NEGATIVE mg/dL
Nitrite: NEGATIVE
Protein, ur: 100 mg/dL — AB
Specific Gravity, Urine: 1.011 (ref 1.005–1.030)
pH: 7 (ref 5.0–8.0)

## 2022-06-22 LAB — URINALYSIS, MICROSCOPIC (REFLEX): RBC / HPF: >50 RBC/hpf (ref 0–5)

## 2022-06-22 LAB — APTT: aPTT: 42 seconds — ABNORMAL HIGH (ref 24–36)

## 2022-06-22 LAB — LACTIC ACID, PLASMA
Lactic Acid, Venous: 1.4 mmol/L (ref 0.5–1.9)
Lactic Acid, Venous: 1.3 mmol/L (ref 0.5–1.9)

## 2022-06-22 LAB — PROTIME-INR
INR: 1.5 — ABNORMAL HIGH (ref 0.8–1.2)
Prothrombin Time: 17.6 seconds — ABNORMAL HIGH (ref 11.4–15.2)

## 2022-06-22 MED ORDER — ACETAMINOPHEN 500 MG PO TABS
1000.0000 mg | ORAL_TABLET | Freq: Once | ORAL | Status: AC
  Administered 2022-06-22: 1000 mg via ORAL
  Filled 2022-06-22: qty 2

## 2022-06-22 MED ORDER — SODIUM CHLORIDE 0.9 % IV BOLUS
1000.0000 mL | Freq: Once | INTRAVENOUS | Status: AC
  Administered 2022-06-22: 1000 mL via INTRAVENOUS

## 2022-06-22 MED ORDER — MORPHINE SULFATE (PF) 4 MG/ML IV SOLN
4.0000 mg | Freq: Once | INTRAVENOUS | Status: AC
  Administered 2022-06-22: 4 mg via INTRAVENOUS
  Filled 2022-06-22: qty 1

## 2022-06-22 NOTE — Discharge Instructions (Addendum)
It was a pleasure caring for you today in the emergency department.  Please return to the emergency department for any worsening or worrisome symptoms.   Please call urologist at the [facility] to arrange follow-up in the next 2 weeks

## 2022-06-22 NOTE — ED Triage Notes (Signed)
Pt with foley catheter in place and noted blood in his bag and with pain-clots per family member. Pt had catheter placed on 3/25 here.

## 2022-06-22 NOTE — ED Provider Notes (Signed)
  Provider Note MRN:  [id number]  Arrival date & time: 06/22/22    ED Course and Medical Decision Making  Assumed care from [place] at shift change.  See note from prior team for complete details, in brief:  72 yo male Chronic indwelling cath Recent PE, on eliquis Bleeding from catheter, some clots in foley Irrigation by nurse, initially improved but has returned w/ discomfort Improving clinically   Plan per prior physician f/u ua and foley xchange  5:59 PM Patient reassessed, he is feeling much better than he was on arrival.   Foley was exchanged, irrigated successfully by nursing and since then has had significant improvement to his abdominal pain. Urine is now draining essentially clear, no longer passing clots.  Urinalysis not consistent with infection.  Hemoglobin stable.  HDS.  Back to his baseline at this time.  Recommend follow-up with his urologist at the [facility] and PCP at [facility] for further evaluation.  The patient improved significantly and was discharged in stable condition. Detailed discussions were had with the patient regarding current findings, and need for close f/u with PCP or on call doctor. The patient has been instructed to return immediately if the symptoms worsen in any way for re-evaluation. Patient verbalized understanding and is in agreement with current care plan. All questions answered prior to discharge.     Procedures  Final Clinical Impressions(s) / ED Diagnoses     ICD-10-CM   1. Clot hematuria  R31.0       ED Discharge Orders     None         Discharge Instructions      It was a pleasure caring for you today in the emergency department.  Please return to the emergency department for any worsening or worrisome symptoms.   Please call urologist at the [facility] to arrange follow-up in the next 2 weeks         Jeanell Sparrow, DO 06/22/22 1759

## 2022-06-22 NOTE — ED Notes (Signed)
EDP and pt advised that he has a 33F Foley, one size down from foley he had when he came in today.  EDP advises that we can exchange but if pt would like to d/c with this foley he may.  Pt and wife notified and discussed with them.  Pt declines exchange for larger cath and decides to d/c with current foley in place.  Yellow urine draining at this time

## 2022-06-22 NOTE — ED Triage Notes (Signed)
Pt is on a blood thinner per family member.

## 2022-06-22 NOTE — ED Provider Notes (Signed)
[place] Provider Note  CSN: [id number] Arrival date & time: 06/22/22 1036  Chief Complaint(s) urinary catheter bleeding  HPI Aaron Leon is a 72 y.o. male history of COPD, hypertension, recent diagnosis of pulmonary embolism started on Eliquis, recent admission for pneumonia presenting to the emergency department with blood in the Foley.  They wanted to get this checked out.  Otherwise had been feeling well, cough had been about the same, no shortness of breath, chest pain, abdominal pain.  While in the waiting room, he also felt increasing weakness and became sweaty.  He continues to deny any chest pain, abdominal pain but now feels very weak.  He did note 1 dark stool this morning but no blood in the stool.   Past Medical History Past Medical History:  Diagnosis Date   COPD (chronic obstructive pulmonary disease) ([place])    Diabetes mellitus without complication ([place])    "type 2"   Hypertension    Stroke [facility])    Patient Active Problem List   Diagnosis Date Noted   Pulmonary embolism ([place]) 06/17/2022   Diabetes mellitus ([place]) 06/17/2022   Essential hypertension 06/17/2022   Hyperlipidemia 06/17/2022   Debility 06/17/2022   Dementia with behavioral disturbance ([place]) 06/17/2022    Class: Chronic   Lobar pneumonia ([place]) 06/17/2022   Urinary retention 06/17/2022   Testicular pain 06/17/2022   History of CVA (cerebrovascular accident) 06/17/2022   Home Medication(s) Prior to Admission medications   Medication Sig Start Date End Date Taking? Authorizing Provider  acetaminophen (TYLENOL) 500 MG tablet Take 500 mg by mouth in the morning and at bedtime.    [provider]  albuterol (PROVENTIL) (2.5 MG/3ML) 0.083% nebulizer solution Take 2.5 mg by nebulization every 6 (six) hours as needed for wheezing or shortness of breath.    [provider]  albuterol (VENTOLIN HFA) 108 (90 Base) MCG/ACT inhaler Inhale 1 puff  into the lungs every 6 (six) hours as needed for wheezing or shortness of breath.    [provider]  amLODipine (NORVASC) 5 MG tablet Take 5 mg by mouth daily.    [provider]  apixaban (ELIQUIS) 5 MG TABS tablet Take 2 tablets (10 mg total) by mouth 2 (two) times daily for 6 days. 06/19/22 06/25/22  Shahmehdi, Valeria Batman, MD  apixaban (ELIQUIS) 5 MG TABS tablet Take 1 tablet (5 mg total) by mouth 2 (two) times daily. 06/24/22   Shahmehdi, Valeria Batman, MD  ascorbic acid (VITAMIN C) 500 MG tablet Take 500 mg by mouth daily.    [provider]  aspirin 325 MG tablet Take 325 mg by mouth daily.    [provider]  atorvastatin (LIPITOR) 40 MG tablet Take 1 tablet (40 mg total) by mouth at bedtime. 06/19/22 07/19/22  Shahmehdi, Valeria Batman, MD  baclofen (LIORESAL) 10 MG tablet Take 10-20 mg by mouth 2 (two) times daily.    [provider]  bisacodyl (DULCOLAX) 5 MG EC tablet Take 1 tablet (5 mg total) by mouth daily. 06/19/22   Shahmehdi, Valeria Batman, MD  budesonide-formoterol (SYMBICORT) 80-4.5 MCG/ACT inhaler Inhale 2 puffs into the lungs 2 (two) times daily.    [provider]  carbidopa-levodopa (SINEMET CR) 50-200 MG tablet Take 1 tablet by mouth at bedtime. 06/19/22 07/19/22  Shahmehdi, Valeria Batman, MD  carbidopa-levodopa (SINEMET IR) 25-100 MG tablet Take 2 tablets by mouth 3 (three) times daily. 02/20/21   [provider]  cetirizine (ZYRTEC) 10  MG tablet Take 10 mg by mouth in the morning.     [provider]  cholecalciferol (VITAMIN D3) 25 MCG (1000 UNIT) tablet Take 1,000 Units by mouth in the morning and at bedtime.    [provider]  ferrous sulfate 325 (65 FE) MG EC tablet Take 325 mg by mouth in the morning.     [provider]  furosemide (LASIX) 20 MG tablet Take 5 mg by mouth daily.    [provider]  gabapentin (NEURONTIN) 300 MG capsule Take 2 capsules (600 mg total) by mouth 2 (two) times daily with  breakfast and lunch. 06/19/22 07/19/22  Shahmehdi, Valeria Batman, MD  gabapentin (NEURONTIN) 300 MG capsule Take 3 capsules (900 mg total) by mouth at bedtime. 06/19/22 07/19/22  Deatra James, MD  galantamine (RAZADYNE ER) 24 MG 24 hr capsule Take 24 mg by mouth every evening. 02/20/21   [provider]  hyoscyamine (LEVSIN SL) 0.125 MG SL tablet Place 1 tablet (0.125 mg total) under the tongue every 4 (four) hours as needed for cramping. 06/19/22   Shahmehdi, Valeria Batman, MD  insulin aspart (NOVOLOG) 100 UNIT/ML injection Inject 1-2 Units into the skin 3 (three) times daily before meals. Per sliding scale    [provider]  insulin detemir (LEVEMIR) 100 UNIT/ML injection Inject 12 Units into the skin every morning.    [provider]  lactobacillus acidophilus & bulgar (LACTINEX) chewable tablet Chew 1 tablet by mouth 3 (three) times daily with meals for 7 days. 06/19/22 06/26/22  Deatra James, MD  levofloxacin (LEVAQUIN) 750 MG tablet Take 1 tablet (750 mg total) by mouth daily for 5 days. 06/19/22 06/24/22  Shahmehdi, Valeria Batman, MD  lisinopril (ZESTRIL) 10 MG tablet Take 5 mg by mouth daily.    [provider]  magnesium oxide (MAG-OX) 400 (240 Mg) MG tablet Take 400-800 mg by mouth daily. 2 tablets in the morning and 1 tablet in the evening    [provider]  melatonin 3 MG TABS tablet Take 3 mg by mouth at bedtime.    [provider]  memantine (NAMENDA) 10 MG tablet Take 10 mg by mouth 2 (two) times daily.    [provider]  metFORMIN (GLUCOPHAGE) 1000 MG tablet Take 1,000 mg by mouth 2 (two) times daily with a meal.    [provider]  metoprolol tartrate (LOPRESSOR) 25 MG tablet Take 25 mg by mouth 2 (two) times daily.    [provider]  mirabegron ER (MYRBETRIQ) 25 MG TB24 tablet Take 1 tablet (25 mg total) by mouth daily. 06/19/22 08/18/22  ShahmehdiValeria Batman, MD  omeprazole (PRILOSEC) 20 MG capsule Take 20 mg by mouth  in the morning.     [provider]  oxyCODONE-acetaminophen (PERCOCET) 10-325 MG tablet Take 1 tablet by mouth every 6 (six) hours as needed for pain.    [provider]  polyethylene glycol powder (GLYCOLAX/MIRALAX) 17 GM/SCOOP powder Take 17 g by mouth daily as needed for mild constipation or moderate constipation.    [provider]  sertraline (ZOLOFT) 100 MG tablet Take 100 mg by mouth in the morning.     [provider]  tamsulosin (FLOMAX) 0.4 MG CAPS capsule Take 0.4 mg by mouth at bedtime.    [provider]  tiotropium (SPIRIVA) 18 MCG inhalation capsule Place 18 mcg into inhaler and inhale in the morning.     [provider]  zolpidem (AMBIEN) 10 MG  tablet Take 10 mg by mouth at bedtime.    [provider]                                                                                                                                    Past Surgical History Past Surgical History:  Procedure Laterality Date   BACK SURGERY     CATARACT EXTRACTION     CERVICAL FUSION     KNEE SURGERY     Family History History reviewed. No pertinent family history.  Social History Social History   Tobacco Use   Smoking status: Former    Types: E-cigarettes   Smokeless tobacco: Never  Substance Use Topics   Alcohol use: Not Currently   Drug use: Never   Allergies Barbiturates, Seroquel  [quetiapine], and Shellfish allergy  Review of Systems Review of Systems  All other systems reviewed and are negative.   Physical Exam Vital Signs  I have reviewed the triage vital signs BP (!) 102/53   Pulse 68   Temp (!) 97.4 F (36.3 C) (Oral)   Resp 16   Ht 6' (1.829 m)   Wt 66.2 kg   SpO2 97%   BMI 19.80 kg/m  Physical Exam Vitals and nursing note reviewed.  Constitutional:      General: He is not in acute distress.    Appearance: Normal appearance. He is ill-appearing and diaphoretic.  HENT:     Mouth/Throat:      Mouth: Mucous membranes are moist.  Eyes:     Conjunctiva/sclera: Conjunctivae normal.  Cardiovascular:     Rate and Rhythm: Normal rate and regular rhythm.  Pulmonary:     Effort: Pulmonary effort is normal. No respiratory distress.     Breath sounds: Normal breath sounds.  Abdominal:     General: Abdomen is flat.     Palpations: Abdomen is soft.     Tenderness: There is no abdominal tenderness.  Genitourinary:    Comments: Foley in place, some very small clots mixed with pink-colored urine Musculoskeletal:     Right lower leg: No edema.     Left lower leg: No edema.  Skin:    General: Skin is warm.     Capillary Refill: Capillary refill takes less than 2 seconds.     Coloration: Skin is pale.  Neurological:     Mental Status: He is alert and oriented to person, place, and time. Mental status is at baseline.  Psychiatric:        Mood and Affect: Mood normal.        Behavior: Behavior normal.     ED Results and Treatments Labs (all labs ordered are listed, but only abnormal results are displayed) Labs Reviewed  COMPREHENSIVE METABOLIC PANEL - Abnormal; Notable for the following components:      Result Value   Glucose, Bld 139 (*)    Calcium 8.7 (*)    Albumin  2.8 (*)    AST 13 (*)    All other components within normal limits  CBC WITH DIFFERENTIAL/PLATELET - Abnormal; Notable for the following components:   RBC 3.89 (*)    Hemoglobin 10.7 (*)    HCT 34.8 (*)    RDW 15.6 (*)    Neutro Abs 8.0 (*)    Abs Immature Granulocytes 0.09 (*)    All other components within normal limits  URINALYSIS, ROUTINE W REFLEX MICROSCOPIC - Abnormal; Notable for the following components:   Color, Urine RED (*)    APPearance CLOUDY (*)    Hgb urine dipstick LARGE (*)    Protein, ur 100 (*)    Leukocytes,Ua TRACE (*)    All other components within normal limits  PROTIME-INR - Abnormal; Notable for the following components:   Prothrombin Time 17.6 (*)    INR 1.5 (*)    All other  components within normal limits  APTT - Abnormal; Notable for the following components:   aPTT 42 (*)    All other components within normal limits  URINALYSIS, MICROSCOPIC (REFLEX) - Abnormal; Notable for the following components:   Bacteria, UA RARE (*)    All other components within normal limits  CULTURE, BLOOD (ROUTINE X 2)  CULTURE, BLOOD (ROUTINE X 2)  LACTIC ACID, PLASMA  LACTIC ACID, PLASMA  I-STAT CHEM 8, ED  TYPE AND SCREEN                                                                                                                          Radiology No results found.  Pertinent labs & imaging results that were available during my care of the patient were reviewed by me and considered in my medical decision making (see MDM for details).  Medications Ordered in ED Medications  morphine (PF) 4 MG/ML injection 4 mg (has no administration in time range)  sodium chloride 0.9 % bolus 1,000 mL (0 mLs Intravenous Stopped 06/22/22 1458)  acetaminophen (TYLENOL) tablet 1,000 mg (1,000 mg Oral Given 06/22/22 1457)                                                                                                                                     Procedures Procedures  (including critical care time)  Medical Decision Making / ED Course   MDM:  72 year old male presenting to the emergency department with blood in  urine.  Suspect blood in urine is likely due to recent Foley placement and anticoagulation.  Clinically does not have signs of urinary obstruction.  His urine is already clearing, will ask nurse to irrigate.  Additionally, patient became pale, diaphoretic while in the waiting room.  Blood pressure is mildly low. Could also be  vagal response to pain from clot hematuria. He does note a dark stool today which could be a sign of a GI bleed, will obtain broad laboratory workup including CBC. No fever to suggest worsening infection and he denies worsening of his cough.  Will  monitor closely.  No chest pain to suggest cardiac cause, EKG without signs of acute STEMI.  Will monitor closely.  Clinical Course as of 06/22/22 1520  Sat Jun 22, 2022  1459 Labs overall reassuring.  Chronic anemia stable.  No elevated lactate or leukocytosis.  Still pending urinalysis.  Patient Foley catheter with significant clots.  Nursing unable to irrigate so catheter exchanged.  Will continue to attempt irrigation.  Signed out pending urinalysis and irrigation of catheter. [WS]    Clinical Course User Index [WS] Cristie Hem, MD     Additional history obtained: -Additional history obtained from spouse -External records from outside source obtained and reviewed including: Chart review including previous notes, labs, imaging, consultation notes including d/c summary from recent admission   Lab Tests: -I ordered, reviewed, and interpreted labs.   The pertinent results include:   Labs Reviewed  COMPREHENSIVE METABOLIC PANEL - Abnormal; Notable for the following components:      Result Value   Glucose, Bld 139 (*)    Calcium 8.7 (*)    Albumin 2.8 (*)    AST 13 (*)    All other components within normal limits  CBC WITH DIFFERENTIAL/PLATELET - Abnormal; Notable for the following components:   RBC 3.89 (*)    Hemoglobin 10.7 (*)    HCT 34.8 (*)    RDW 15.6 (*)    Neutro Abs 8.0 (*)    Abs Immature Granulocytes 0.09 (*)    All other components within normal limits  URINALYSIS, ROUTINE W REFLEX MICROSCOPIC - Abnormal; Notable for the following components:   Color, Urine RED (*)    APPearance CLOUDY (*)    Hgb urine dipstick LARGE (*)    Protein, ur 100 (*)    Leukocytes,Ua TRACE (*)    All other components within normal limits  PROTIME-INR - Abnormal; Notable for the following components:   Prothrombin Time 17.6 (*)    INR 1.5 (*)    All other components within normal limits  APTT - Abnormal; Notable for the following components:   aPTT 42 (*)    All other  components within normal limits  URINALYSIS, MICROSCOPIC (REFLEX) - Abnormal; Notable for the following components:   Bacteria, UA RARE (*)    All other components within normal limits  CULTURE, BLOOD (ROUTINE X 2)  CULTURE, BLOOD (ROUTINE X 2)  LACTIC ACID, PLASMA  LACTIC ACID, PLASMA  I-STAT CHEM 8, ED  TYPE AND SCREEN    Notable for chronic stable anemia. Mild elevation in INR/aPTT c/w AC use  EKG   EKG Interpretation  Date/Time:  Saturday June 22 2022 11:38:49 EDT Ventricular Rate:  70 PR Interval:  155 QRS Duration: 99 QT Interval:  402 QTC Calculation: 434 R Axis:   -61 Text Interpretation: Sinus rhythm Consider left atrial enlargement LAD, consider left anterior fascicular block Confirmed by Garnette Gunner [phone]) on 06/22/2022 12:02:21  PM          Medicines ordered and prescription drug management: Meds ordered this encounter  Medications   sodium chloride 0.9 % bolus 1,000 mL   acetaminophen (TYLENOL) tablet 1,000 mg   morphine (PF) 4 MG/ML injection 4 mg    -I have reviewed the patients home medicines and have made adjustments as needed   Cardiac Monitoring: The patient was maintained on a cardiac monitor.  I personally viewed and interpreted the cardiac monitored which showed an underlying rhythm of: NSR  Social Determinants of Health:  Diagnosis or treatment significantly limited by social determinants of health: former smoker   Reevaluation: After the interventions noted above, I reevaluated the patient and found that their symptoms have improved  Co morbidities that complicate the patient evaluation  Past Medical History:  Diagnosis Date   COPD (chronic obstructive pulmonary disease) ([facility])    Diabetes mellitus without complication ([place])    "type 2"   Hypertension    Stroke [facility])       Dispostion: Disposition decision including need for hospitalization was considered, and patient disposition pending at time of sign out.    Final  Clinical Impression(s) / ED Diagnoses Final diagnoses:  Clot hematuria     This chart was dictated using voice recognition software.  Despite best efforts to proofread,  errors can occur which can change the documentation meaning.    Cristie Hem, MD 06/22/22 1520

## 2022-06-27 LAB — CULTURE, BLOOD (ROUTINE X 2)
Culture: NO GROWTH
Culture: NO GROWTH
Special Requests: ADEQUATE
Special Requests: ADEQUATE

## 2022-07-08 ENCOUNTER — Emergency Department (HOSPITAL_COMMUNITY)
Admission: EM | Admit: 2022-07-08 | Discharge: 2022-07-08 | Disposition: A | Discharge status: Home / Self Care | Payer: No Typology Code available for payment source | Attending: Emergency Medicine | Admitting: Emergency Medicine

## 2022-07-08 ENCOUNTER — Other Ambulatory Visit: Payer: Self-pay

## 2022-07-08 ENCOUNTER — Encounter (HOSPITAL_COMMUNITY): Payer: Self-pay | Admitting: *Deleted

## 2022-07-08 DIAGNOSIS — Z794 Long term (current) use of insulin: Secondary | ICD-10-CM | POA: Diagnosis not present

## 2022-07-08 DIAGNOSIS — R34 Anuria and oliguria: Secondary | ICD-10-CM | POA: Diagnosis present

## 2022-07-08 DIAGNOSIS — Z7901 Long term (current) use of anticoagulants: Secondary | ICD-10-CM | POA: Diagnosis not present

## 2022-07-08 DIAGNOSIS — Z7982 Long term (current) use of aspirin: Secondary | ICD-10-CM | POA: Diagnosis not present

## 2022-07-08 DIAGNOSIS — E860 Dehydration: Secondary | ICD-10-CM | POA: Insufficient documentation

## 2022-07-08 HISTORY — DX: Personal history of other infectious and parasitic diseases: Z86.19

## 2022-07-08 HISTORY — DX: Other pulmonary embolism without acute cor pulmonale: I26.99

## 2022-07-08 NOTE — ED Provider Notes (Signed)
[place] EMERGENCY DEPARTMENT AT [facility] Provider Note   CSN: [id number] Arrival date & time: 07/08/22  1914     History  Chief Complaint  Patient presents with   Abdominal Pain    Aaron Leon is a 72 y.o. male.  HPI Bedbound adult male presents with male caregivers with concern for decreased urine production from a Foley catheter. Patient has had a catheter for 4 months, last exchanged about 1 week ago, has had decreased urine production, with increasing abdominal fullness since the early hours of the morning.  No other complaints, no reported fall, fever, vomiting.  Patient is essentially bedbound.     Home Medications Prior to Admission medications   Medication Sig Start Date End Date Taking? Authorizing Provider  acetaminophen (TYLENOL) 500 MG tablet Take 500 mg by mouth in the morning and at bedtime.    [provider]  albuterol (PROVENTIL) (2.5 MG/3ML) 0.083% nebulizer solution Take 2.5 mg by nebulization every 6 (six) hours as needed for wheezing or shortness of breath.    [provider]  albuterol (VENTOLIN HFA) 108 (90 Base) MCG/ACT inhaler Inhale 1 puff into the lungs every 6 (six) hours as needed for wheezing or shortness of breath.    [provider]  amLODipine (NORVASC) 5 MG tablet Take 5 mg by mouth daily.    [provider]  apixaban (ELIQUIS) 5 MG TABS tablet Take 2 tablets (10 mg total) by mouth 2 (two) times daily for 6 days. 06/19/22 06/25/22  Shahmehdi, Gemma Payor, MD  apixaban (ELIQUIS) 5 MG TABS tablet Take 1 tablet (5 mg total) by mouth 2 (two) times daily. 06/24/22   Shahmehdi, Gemma Payor, MD  ascorbic acid (VITAMIN C) 500 MG tablet Take 500 mg by mouth daily.    [provider]  aspirin 325 MG tablet Take 325 mg by mouth daily.    [provider]  atorvastatin (LIPITOR) 40 MG tablet Take 1 tablet (40 mg total) by mouth at bedtime. 06/19/22 07/19/22  Shahmehdi, Gemma Payor, MD  baclofen  (LIORESAL) 10 MG tablet Take 10-20 mg by mouth 2 (two) times daily.    [provider]  bisacodyl (DULCOLAX) 5 MG EC tablet Take 1 tablet (5 mg total) by mouth daily. 06/19/22   Shahmehdi, Gemma Payor, MD  budesonide-formoterol (SYMBICORT) 80-4.5 MCG/ACT inhaler Inhale 2 puffs into the lungs 2 (two) times daily.    [provider]  carbidopa-levodopa (SINEMET CR) 50-200 MG tablet Take 1 tablet by mouth at bedtime. 06/19/22 07/19/22  Shahmehdi, Gemma Payor, MD  carbidopa-levodopa (SINEMET IR) 25-100 MG tablet Take 2 tablets by mouth 3 (three) times daily. 02/20/21   [provider]  cetirizine (ZYRTEC) 10 MG tablet Take 10 mg by mouth in the morning.     [provider]  cholecalciferol (VITAMIN D3) 25 MCG (1000 UNIT) tablet Take 1,000 Units by mouth in the morning and at bedtime.    [provider]  ferrous sulfate 325 (65 FE) MG EC tablet Take 325 mg by mouth in the morning.     [provider]  furosemide (LASIX) 20 MG tablet Take 5 mg by mouth daily.    [provider]  gabapentin (NEURONTIN) 300 MG capsule Take 2 capsules (600 mg total) by mouth 2 (two) times daily with breakfast and lunch. 06/19/22 07/19/22  Shahmehdi, Gemma Payor, MD  gabapentin (NEURONTIN) 300 MG capsule Take 3 capsules (900 mg total) by mouth at bedtime. 06/19/22 07/19/22  Shahmehdi,  Seyed A, MD  galantamine (RAZADYNE ER) 24 MG 24 hr capsule Take 24 mg by mouth every evening. 02/20/21   [provider]  hyoscyamine (LEVSIN SL) 0.125 MG SL tablet Place 1 tablet (0.125 mg total) under the tongue every 4 (four) hours as needed for cramping. 06/19/22   Shahmehdi, Gemma Payor, MD  insulin aspart (NOVOLOG) 100 UNIT/ML injection Inject 1-2 Units into the skin 3 (three) times daily before meals. Per sliding scale    [provider]  insulin detemir (LEVEMIR) 100 UNIT/ML injection Inject 12 Units into the skin every morning.    [provider]  lisinopril (ZESTRIL) 10 MG  tablet Take 5 mg by mouth daily.    [provider]  magnesium oxide (MAG-OX) 400 (240 Mg) MG tablet Take 400-800 mg by mouth daily. 2 tablets in the morning and 1 tablet in the evening    [provider]  melatonin 3 MG TABS tablet Take 3 mg by mouth at bedtime.    [provider]  memantine (NAMENDA) 10 MG tablet Take 10 mg by mouth 2 (two) times daily.    [provider]  metFORMIN (GLUCOPHAGE) 1000 MG tablet Take 1,000 mg by mouth 2 (two) times daily with a meal.    [provider]  metoprolol tartrate (LOPRESSOR) 25 MG tablet Take 25 mg by mouth 2 (two) times daily.    [provider]  mirabegron ER (MYRBETRIQ) 25 MG TB24 tablet Take 1 tablet (25 mg total) by mouth daily. 06/19/22 08/18/22  ShahmehdiGemma Payor, MD  omeprazole (PRILOSEC) 20 MG capsule Take 20 mg by mouth in the morning.     [provider]  oxyCODONE-acetaminophen (PERCOCET) 10-325 MG tablet Take 1 tablet by mouth every 6 (six) hours as needed for pain.    [provider]  polyethylene glycol powder (GLYCOLAX/MIRALAX) 17 GM/SCOOP powder Take 17 g by mouth daily as needed for mild constipation or moderate constipation.    [provider]  sertraline (ZOLOFT) 100 MG tablet Take 100 mg by mouth in the morning.     [provider]  tamsulosin (FLOMAX) 0.4 MG CAPS capsule Take 0.4 mg by mouth at bedtime.    [provider]  tiotropium (SPIRIVA) 18 MCG inhalation capsule Place 18 mcg into inhaler and inhale in the morning.     [provider]  zolpidem (AMBIEN) 10 MG tablet Take 10 mg by mouth at bedtime.    [provider]      Allergies    Barbiturates, Seroquel  [quetiapine], and Shellfish allergy    Review of Systems   Review of Systems  All other systems reviewed and are negative.   Physical Exam Updated Vital Signs Temp 98.5 F (36.9 C) (Oral)   Ht 6' (1.829 m)   Wt 66.2 kg   BMI 19.80 kg/m  Physical  Exam Vitals and nursing note reviewed.  Constitutional:      General: He is not in acute distress.    Appearance: He is well-developed.     Comments: Sickly elderly appearing male in no distress otherwise  HENT:     Head: Normocephalic and atraumatic.  Eyes:     Conjunctiva/sclera: Conjunctivae normal.  Cardiovascular:     Rate and Rhythm: Normal rate and regular rhythm.  Pulmonary:     Effort: Pulmonary effort is normal. No respiratory distress.     Breath sounds: No stridor.  Abdominal:     General: There is no distension.  Tenderness: There is abdominal tenderness in the suprapubic area.  Genitourinary:    Comments: Minimal urine production and a Foley catheter bag. Skin:    General: Skin is warm and dry.  Neurological:     Mental Status: He is alert.     Motor: Weakness present.     ED Results / Procedures / Treatments   Labs (all labs ordered are listed, but only abnormal results are displayed) Labs Reviewed - No data to display  EKG None  Radiology No results found.  Procedures Procedures    Medications Ordered in ED Medications - No data to display  ED Course/ Medical Decision Making/ A&P                             Medical Decision Making Adult male presents with evidence for acute urinary retention due to possibly malfunctioning Foley catheter.  He is awake and alert, afebrile, has tenderness in his abdomen.  Differential includes the above as well as urinary tract infection, though this seems less likely absent fever, other complaints, patient had bladder scan, irrigation of catheter after arrival.   Amount and/or Complexity of Data Reviewed Independent Historian: caregiver External Data Reviewed: notes. Radiology:     Details: Bladder scan ordered and reviewed  Risk Prescription drug management. Decision regarding hospitalization.   10:20 AM Bladder scan with 6 mL of urine.  Patient mains awake, alert, hemodynamically unremarkable.  I  discussed that bladder scan result with the patient and his wife.  We discussed options for neck step including additional evaluation with labs, versus encouraging oral resuscitation with liquids at home, patient, wife, both amenable to that plan, and absent evidence for fever, bacteremia, sepsis without other focal complaints, the patient discharged in stable condition.        Final Clinical Impression(s) / ED Diagnoses Final diagnoses:  Dehydration    Rx / DC Orders ED Discharge Orders     None         Gerhard Munch, MD 07/08/22 1020

## 2022-07-08 NOTE — Discharge Instructions (Signed)
As discussed, your evaluation today has been largely reassuring.  But, it is important that you monitor your condition carefully, and do not hesitate to return to the ED if you develop new, or concerning changes in your condition. ? ?Otherwise, please follow-up with your physician for appropriate ongoing care. ? ?

## 2022-07-08 NOTE — ED Triage Notes (Addendum)
Pt c/o RLQ abdominal pain since 0500 this morning. Denies n/v/d. Pt does have a chronic urinary catheter, but wife reports it appears to be draining a little less than normal this morning.

## 2022-07-27 ENCOUNTER — Other Ambulatory Visit: Payer: Self-pay

## 2022-07-27 ENCOUNTER — Inpatient Hospital Stay (HOSPITAL_COMMUNITY)
Admission: EM | Admit: 2022-07-27 | Discharge: 2022-07-30 | DRG: 193 | Disposition: A | Discharge status: Home Health | Payer: No Typology Code available for payment source | Attending: Internal Medicine | Admitting: Internal Medicine

## 2022-07-27 ENCOUNTER — Emergency Department (HOSPITAL_COMMUNITY): Payer: No Typology Code available for payment source

## 2022-07-27 DIAGNOSIS — Z7951 Long term (current) use of inhaled steroids: Secondary | ICD-10-CM | POA: Diagnosis not present

## 2022-07-27 DIAGNOSIS — Z7982 Long term (current) use of aspirin: Secondary | ICD-10-CM

## 2022-07-27 DIAGNOSIS — Z96641 Presence of right artificial hip joint: Secondary | ICD-10-CM | POA: Diagnosis present

## 2022-07-27 DIAGNOSIS — Z7984 Long term (current) use of oral hypoglycemic drugs: Secondary | ICD-10-CM

## 2022-07-27 DIAGNOSIS — Z86711 Personal history of pulmonary embolism: Secondary | ICD-10-CM | POA: Diagnosis not present

## 2022-07-27 DIAGNOSIS — Z794 Long term (current) use of insulin: Secondary | ICD-10-CM

## 2022-07-27 DIAGNOSIS — J441 Chronic obstructive pulmonary disease with (acute) exacerbation: Secondary | ICD-10-CM | POA: Diagnosis present

## 2022-07-27 DIAGNOSIS — Z79899 Other long term (current) drug therapy: Secondary | ICD-10-CM

## 2022-07-27 DIAGNOSIS — Z981 Arthrodesis status: Secondary | ICD-10-CM | POA: Diagnosis not present

## 2022-07-27 DIAGNOSIS — Z91013 Allergy to seafood: Secondary | ICD-10-CM | POA: Diagnosis not present

## 2022-07-27 DIAGNOSIS — F17290 Nicotine dependence, other tobacco product, uncomplicated: Secondary | ICD-10-CM | POA: Diagnosis present

## 2022-07-27 DIAGNOSIS — I2699 Other pulmonary embolism without acute cor pulmonale: Secondary | ICD-10-CM | POA: Diagnosis present

## 2022-07-27 DIAGNOSIS — E119 Type 2 diabetes mellitus without complications: Secondary | ICD-10-CM | POA: Diagnosis present

## 2022-07-27 DIAGNOSIS — I10 Essential (primary) hypertension: Secondary | ICD-10-CM | POA: Diagnosis present

## 2022-07-27 DIAGNOSIS — Z7901 Long term (current) use of anticoagulants: Secondary | ICD-10-CM | POA: Diagnosis not present

## 2022-07-27 DIAGNOSIS — F03918 Unspecified dementia, unspecified severity, with other behavioral disturbance: Secondary | ICD-10-CM | POA: Diagnosis present

## 2022-07-27 DIAGNOSIS — L89616 Pressure-induced deep tissue damage of right heel: Secondary | ICD-10-CM | POA: Diagnosis present

## 2022-07-27 DIAGNOSIS — J9621 Acute and chronic respiratory failure with hypoxia: Secondary | ICD-10-CM | POA: Diagnosis present

## 2022-07-27 DIAGNOSIS — Z8673 Personal history of transient ischemic attack (TIA), and cerebral infarction without residual deficits: Secondary | ICD-10-CM

## 2022-07-27 DIAGNOSIS — E785 Hyperlipidemia, unspecified: Secondary | ICD-10-CM | POA: Diagnosis present

## 2022-07-27 DIAGNOSIS — J690 Pneumonitis due to inhalation of food and vomit: Secondary | ICD-10-CM | POA: Insufficient documentation

## 2022-07-27 DIAGNOSIS — Z1152 Encounter for screening for COVID-19: Secondary | ICD-10-CM | POA: Diagnosis not present

## 2022-07-27 DIAGNOSIS — J440 Chronic obstructive pulmonary disease with acute lower respiratory infection: Secondary | ICD-10-CM | POA: Diagnosis present

## 2022-07-27 DIAGNOSIS — Z888 Allergy status to other drugs, medicaments and biological substances status: Secondary | ICD-10-CM | POA: Diagnosis not present

## 2022-07-27 DIAGNOSIS — J189 Pneumonia, unspecified organism: Principal | ICD-10-CM

## 2022-07-27 DIAGNOSIS — J9601 Acute respiratory failure with hypoxia: Secondary | ICD-10-CM

## 2022-07-27 LAB — COMPREHENSIVE METABOLIC PANEL
ALT: 13 U/L (ref 0–44)
AST: 15 U/L (ref 15–41)
Albumin: 2.6 g/dL — ABNORMAL LOW (ref 3.5–5.0)
Alkaline Phosphatase: 106 U/L (ref 38–126)
Anion gap: 9 (ref 5–15)
BUN: 16 mg/dL (ref 8–23)
CO2: 27 mmol/L (ref 22–32)
Calcium: 8.4 mg/dL — ABNORMAL LOW (ref 8.9–10.3)
Chloride: 101 mmol/L (ref 98–111)
Creatinine, Ser: 0.75 mg/dL (ref 0.61–1.24)
GFR, Estimated: >60 mL/min (ref >60)
Glucose, Bld: 176 mg/dL — ABNORMAL HIGH (ref 70–99)
Potassium: 3.8 mmol/L (ref 3.5–5.1)
Sodium: 137 mmol/L (ref 135–145)
Total Bilirubin: 0.8 mg/dL (ref 0.3–1.2)
Total Protein: 6.2 g/dL — ABNORMAL LOW (ref 6.5–8.1)

## 2022-07-27 LAB — BLOOD GAS, VENOUS
Acid-Base Excess: 4 mmol/L — ABNORMAL HIGH (ref 0.0–2.0)
Bicarbonate: 31.9 mmol/L — ABNORMAL HIGH (ref 20.0–28.0)
Drawn by: 27160
O2 Saturation: 16.1 %
Patient temperature: 36.9
pCO2, Ven: 62 mmHg — ABNORMAL HIGH (ref 44–60)
pH, Ven: 7.32 (ref 7.25–7.43)
pO2, Ven: <31 mmHg — CL (ref 32–45)

## 2022-07-27 LAB — SARS CORONAVIRUS 2 BY RT PCR: SARS Coronavirus 2 by RT PCR: NEGATIVE

## 2022-07-27 LAB — CBC
HCT: 36.9 % — ABNORMAL LOW (ref 39.0–52.0)
Hemoglobin: 11.5 g/dL — ABNORMAL LOW (ref 13.0–17.0)
MCH: 26.9 pg (ref 26.0–34.0)
MCHC: 31.2 g/dL (ref 30.0–36.0)
MCV: 86.4 fL (ref 80.0–100.0)
Platelets: 342 10*3/uL (ref 150–400)
RBC: 4.27 MIL/uL (ref 4.22–5.81)
RDW: 16.2 % — ABNORMAL HIGH (ref 11.5–15.5)
WBC: 12.5 10*3/uL — ABNORMAL HIGH (ref 4.0–10.5)
nRBC: 0 % (ref 0.0–0.2)

## 2022-07-27 LAB — BRAIN NATRIURETIC PEPTIDE: B Natriuretic Peptide: 232 pg/mL — ABNORMAL HIGH (ref 0.0–100.0)

## 2022-07-27 LAB — TROPONIN I (HIGH SENSITIVITY): Troponin I (High Sensitivity): 4 ng/L (ref <18)

## 2022-07-27 LAB — D-DIMER, QUANTITATIVE: D-Dimer, Quant: 0.71 ug/mL-FEU — ABNORMAL HIGH (ref 0.00–0.50)

## 2022-07-27 LAB — CBG MONITORING, ED: Glucose-Capillary: 173 mg/dL — ABNORMAL HIGH (ref 70–99)

## 2022-07-27 MED ORDER — APIXABAN 5 MG PO TABS
5.0000 mg | ORAL_TABLET | Freq: Two times a day (BID) | ORAL | Status: DC
  Administered 2022-07-27 – 2022-07-30 (×6): 5 mg via ORAL
  Filled 2022-07-27 (×6): qty 1

## 2022-07-27 MED ORDER — SODIUM CHLORIDE 0.9 % IV SOLN
2.0000 g | INTRAVENOUS | Status: DC
  Administered 2022-07-28 – 2022-07-29 (×2): 2 g via INTRAVENOUS
  Filled 2022-07-27 (×2): qty 20

## 2022-07-27 MED ORDER — METOPROLOL TARTRATE 25 MG PO TABS
12.5000 mg | ORAL_TABLET | Freq: Two times a day (BID) | ORAL | Status: DC
  Administered 2022-07-27 – 2022-07-30 (×5): 12.5 mg via ORAL
  Filled 2022-07-27 (×5): qty 1

## 2022-07-27 MED ORDER — ONDANSETRON HCL 4 MG/2ML IJ SOLN: 4.0000 mg | Freq: Four times a day (QID) | INTRAMUSCULAR | Status: DC | PRN

## 2022-07-27 MED ORDER — ZOLPIDEM TARTRATE 5 MG PO TABS
5.0000 mg | ORAL_TABLET | Freq: Every day | ORAL | Status: DC
  Administered 2022-07-27 – 2022-07-29 (×3): 5 mg via ORAL
  Filled 2022-07-27 (×3): qty 1

## 2022-07-27 MED ORDER — ALBUTEROL SULFATE HFA 108 (90 BASE) MCG/ACT IN AERS: 2.0000 | INHALATION_SPRAY | RESPIRATORY_TRACT | Status: DC | PRN

## 2022-07-27 MED ORDER — GABAPENTIN 300 MG PO CAPS
600.0000 mg | ORAL_CAPSULE | Freq: Two times a day (BID) | ORAL | Status: DC
  Administered 2022-07-28 – 2022-07-30 (×5): 600 mg via ORAL
  Filled 2022-07-27 (×3): qty 2
  Filled 2022-07-27: qty 6
  Filled 2022-07-27: qty 2

## 2022-07-27 MED ORDER — INSULIN ASPART 100 UNIT/ML IJ SOLN
0.0000 [IU] | Freq: Every day | INTRAMUSCULAR | Status: DC
  Administered 2022-07-29: 4 [IU] via SUBCUTANEOUS

## 2022-07-27 MED ORDER — UMECLIDINIUM BROMIDE 62.5 MCG/ACT IN AEPB
1.0000 | INHALATION_SPRAY | Freq: Every morning | RESPIRATORY_TRACT | Status: DC
  Administered 2022-07-28 – 2022-07-30 (×3): 1 via RESPIRATORY_TRACT
  Filled 2022-07-27: qty 7

## 2022-07-27 MED ORDER — IPRATROPIUM BROMIDE 0.02 % IN SOLN: 0.5000 mg | Freq: Once | RESPIRATORY_TRACT | Status: DC

## 2022-07-27 MED ORDER — MIRABEGRON ER 25 MG PO TB24
25.0000 mg | ORAL_TABLET | Freq: Every day | ORAL | Status: DC
  Administered 2022-07-27 – 2022-07-30 (×4): 25 mg via ORAL
  Filled 2022-07-27 (×4): qty 1

## 2022-07-27 MED ORDER — ATORVASTATIN CALCIUM 40 MG PO TABS
40.0000 mg | ORAL_TABLET | Freq: Every day | ORAL | Status: DC
  Administered 2022-07-27 – 2022-07-29 (×3): 40 mg via ORAL
  Filled 2022-07-27 (×3): qty 1

## 2022-07-27 MED ORDER — OXYCODONE-ACETAMINOPHEN 5-325 MG PO TABS
1.0000 | ORAL_TABLET | Freq: Four times a day (QID) | ORAL | Status: DC | PRN
  Administered 2022-07-27: 1 via ORAL
  Filled 2022-07-27: qty 1

## 2022-07-27 MED ORDER — GABAPENTIN 300 MG PO CAPS: 300.0000 mg | ORAL_CAPSULE | Freq: Three times a day (TID) | ORAL | Status: DC

## 2022-07-27 MED ORDER — TAMSULOSIN HCL 0.4 MG PO CAPS
0.4000 mg | ORAL_CAPSULE | Freq: Every day | ORAL | Status: DC
  Administered 2022-07-27 – 2022-07-29 (×3): 0.4 mg via ORAL
  Filled 2022-07-27 (×3): qty 1

## 2022-07-27 MED ORDER — IPRATROPIUM BROMIDE 0.02 % IN SOLN
RESPIRATORY_TRACT | Status: AC
  Administered 2022-07-27: 0.5 mg
  Filled 2022-07-27: qty 2.5

## 2022-07-27 MED ORDER — AMLODIPINE BESYLATE 5 MG PO TABS
5.0000 mg | ORAL_TABLET | Freq: Every day | ORAL | Status: DC
  Administered 2022-07-27 – 2022-07-30 (×4): 5 mg via ORAL
  Filled 2022-07-27 (×4): qty 1

## 2022-07-27 MED ORDER — ONDANSETRON HCL 4 MG PO TABS: 4.0000 mg | ORAL_TABLET | Freq: Four times a day (QID) | ORAL | Status: DC | PRN

## 2022-07-27 MED ORDER — PANTOPRAZOLE SODIUM 40 MG PO TBEC
40.0000 mg | DELAYED_RELEASE_TABLET | Freq: Every day | ORAL | Status: DC
  Administered 2022-07-27 – 2022-07-30 (×4): 40 mg via ORAL
  Filled 2022-07-27 (×4): qty 1

## 2022-07-27 MED ORDER — ALBUTEROL SULFATE (2.5 MG/3ML) 0.083% IN NEBU: 2.5000 mg | INHALATION_SOLUTION | RESPIRATORY_TRACT | Status: DC | PRN

## 2022-07-27 MED ORDER — SODIUM CHLORIDE 0.9 % IV SOLN
2.0000 g | Freq: Once | INTRAVENOUS | Status: AC
  Administered 2022-07-27: 2 g via INTRAVENOUS
  Filled 2022-07-27: qty 20

## 2022-07-27 MED ORDER — ALBUTEROL SULFATE (2.5 MG/3ML) 0.083% IN NEBU
2.5000 mg | INHALATION_SOLUTION | Freq: Once | RESPIRATORY_TRACT | Status: AC
  Administered 2022-07-27: 2.5 mg via RESPIRATORY_TRACT
  Filled 2022-07-27: qty 3

## 2022-07-27 MED ORDER — POLYETHYLENE GLYCOL 3350 17 G PO PACK: 17.0000 g | PACK | Freq: Every day | ORAL | Status: DC | PRN

## 2022-07-27 MED ORDER — SERTRALINE HCL 50 MG PO TABS
100.0000 mg | ORAL_TABLET | Freq: Every morning | ORAL | Status: DC
  Administered 2022-07-28 – 2022-07-30 (×3): 100 mg via ORAL
  Filled 2022-07-27 (×3): qty 2

## 2022-07-27 MED ORDER — METHYLPREDNISOLONE SODIUM SUCC 125 MG IJ SOLR
120.0000 mg | INTRAMUSCULAR | Status: DC
  Administered 2022-07-27: 120 mg via INTRAVENOUS
  Filled 2022-07-27: qty 2

## 2022-07-27 MED ORDER — SODIUM CHLORIDE 0.9 % IV SOLN
500.0000 mg | INTRAVENOUS | Status: DC
  Administered 2022-07-27 – 2022-07-29 (×3): 500 mg via INTRAVENOUS
  Filled 2022-07-27 (×3): qty 5

## 2022-07-27 MED ORDER — FLUTICASONE FUROATE-VILANTEROL 100-25 MCG/ACT IN AEPB
1.0000 | INHALATION_SPRAY | Freq: Every day | RESPIRATORY_TRACT | Status: DC
  Administered 2022-07-28 – 2022-07-30 (×3): 1 via RESPIRATORY_TRACT
  Filled 2022-07-27: qty 28

## 2022-07-27 MED ORDER — BISACODYL 5 MG PO TBEC
5.0000 mg | DELAYED_RELEASE_TABLET | Freq: Every day | ORAL | Status: DC
  Administered 2022-07-27 – 2022-07-30 (×4): 5 mg via ORAL
  Filled 2022-07-27 (×4): qty 1

## 2022-07-27 MED ORDER — INSULIN ASPART 100 UNIT/ML IJ SOLN
0.0000 [IU] | Freq: Three times a day (TID) | INTRAMUSCULAR | Status: DC
  Administered 2022-07-28: 7 [IU] via SUBCUTANEOUS
  Administered 2022-07-28: 4 [IU] via SUBCUTANEOUS
  Administered 2022-07-28 – 2022-07-29 (×2): 7 [IU] via SUBCUTANEOUS
  Administered 2022-07-29 (×2): 4 [IU] via SUBCUTANEOUS
  Administered 2022-07-30: 15 [IU] via SUBCUTANEOUS

## 2022-07-27 MED ORDER — IOHEXOL 350 MG/ML SOLN
75.0000 mL | Freq: Once | INTRAVENOUS | Status: AC | PRN
  Administered 2022-07-27: 75 mL via INTRAVENOUS

## 2022-07-27 MED ORDER — FUROSEMIDE 20 MG PO TABS
10.0000 mg | ORAL_TABLET | Freq: Every day | ORAL | Status: DC
  Administered 2022-07-27 – 2022-07-29 (×2): 10 mg via ORAL
  Filled 2022-07-27 (×2): qty 1

## 2022-07-27 MED ORDER — CARBIDOPA-LEVODOPA 25-100 MG PO TABS
2.0000 | ORAL_TABLET | Freq: Three times a day (TID) | ORAL | Status: DC
  Administered 2022-07-28 – 2022-07-30 (×7): 2 via ORAL
  Filled 2022-07-27 (×11): qty 2

## 2022-07-27 MED ORDER — IPRATROPIUM-ALBUTEROL 0.5-2.5 (3) MG/3ML IN SOLN
3.0000 mL | Freq: Once | RESPIRATORY_TRACT | Status: AC
  Administered 2022-07-27: 3 mL via RESPIRATORY_TRACT
  Filled 2022-07-27: qty 3

## 2022-07-27 MED ORDER — GALANTAMINE HYDROBROMIDE ER 8 MG PO CP24
24.0000 mg | ORAL_CAPSULE | Freq: Every evening | ORAL | Status: DC
  Administered 2022-07-27 – 2022-07-29 (×3): 24 mg via ORAL
  Filled 2022-07-27: qty 1
  Filled 2022-07-27: qty 3
  Filled 2022-07-27: qty 1
  Filled 2022-07-27: qty 3

## 2022-07-27 MED ORDER — SODIUM CHLORIDE 0.9 % IV BOLUS
500.0000 mL | Freq: Once | INTRAVENOUS | Status: AC
  Administered 2022-07-27: 500 mL via INTRAVENOUS

## 2022-07-27 MED ORDER — ALBUTEROL SULFATE (2.5 MG/3ML) 0.083% IN NEBU: 5.0000 mg | INHALATION_SOLUTION | Freq: Once | RESPIRATORY_TRACT | Status: DC

## 2022-07-27 MED ORDER — OXYCODONE-ACETAMINOPHEN 10-325 MG PO TABS: 1.0000 | ORAL_TABLET | Freq: Four times a day (QID) | ORAL | Status: DC | PRN

## 2022-07-27 MED ORDER — OXYCODONE HCL 5 MG PO TABS
5.0000 mg | ORAL_TABLET | Freq: Four times a day (QID) | ORAL | Status: DC | PRN
  Administered 2022-07-27: 5 mg via ORAL
  Filled 2022-07-27: qty 1

## 2022-07-27 MED ORDER — MEMANTINE HCL 10 MG PO TABS
20.0000 mg | ORAL_TABLET | Freq: Every day | ORAL | Status: DC
  Administered 2022-07-27 – 2022-07-29 (×3): 20 mg via ORAL
  Filled 2022-07-27 (×4): qty 2

## 2022-07-27 MED ORDER — CARBIDOPA-LEVODOPA ER 50-200 MG PO TBCR
1.0000 | EXTENDED_RELEASE_TABLET | Freq: Every day | ORAL | Status: DC
  Administered 2022-07-27 – 2022-07-29 (×3): 1 via ORAL
  Filled 2022-07-27 (×6): qty 1

## 2022-07-27 MED ORDER — ALBUTEROL SULFATE (2.5 MG/3ML) 0.083% IN NEBU
2.5000 mg | INHALATION_SOLUTION | RESPIRATORY_TRACT | Status: DC | PRN
  Administered 2022-07-27: 2.5 mg via RESPIRATORY_TRACT
  Filled 2022-07-27: qty 3

## 2022-07-27 MED ORDER — GABAPENTIN 300 MG PO CAPS
900.0000 mg | ORAL_CAPSULE | Freq: Every day | ORAL | Status: DC
  Administered 2022-07-27 – 2022-07-29 (×3): 900 mg via ORAL
  Filled 2022-07-27 (×3): qty 3

## 2022-07-27 NOTE — H&P (Signed)
History and Physical    Patient: Aaron Leon YQM:[id number] DOB: Feb 26, 1951 DOA: 07/27/2022 DOS: the patient was seen and examined on 07/27/2022 PCP: [name]  Patient coming from: Home  Chief Complaint:  Chief Complaint  Patient presents with   Shortness of Breath   HPI: Aaron Leon is a 72 y.o. male with medical history significant of type 2 diabetes, COPD, hypertension, history of pulmonary embolism on Eliquis, history of stroke.  Patient presenting with worsening shortness of breath, fatigue, decreased appetite that started suddenly last night.  History is obtained from the wife who is present at bedside.  She states that when she came home from work, patient was somewhat confused and short of breath.  This morning, he had a progressive decline and his daughter who was watching him called EMS to transport him to the hospital for evaluation.  Here, he was noted to be quite hypoxic and was put on high flow nasal cannula.  He had good response from that.  He also had a lower blood sugar and received oral glucose from his daughter as well.  He denies fevers, chills, chest pain, nausea, vomiting.  Review of Systems: As mentioned in the history of present illness. All other systems reviewed and are negative. Past Medical History:  Diagnosis Date   COPD (chronic obstructive pulmonary disease) (HCC)    Diabetes mellitus without complication (HCC)    "type 2"   H/O septic shock    Hypertension    Pulmonary embolism (HCC)    Stroke [facility])    Past Surgical History:  Procedure Laterality Date   BACK SURGERY     CATARACT EXTRACTION     CERVICAL FUSION     HIP PINNING Left    03/2022   KNEE SURGERY     TOTAL HIP ARTHROPLASTY Right 02/2021   Social History:  reports that he has quit smoking. His smoking use included e-cigarettes. He has never used smokeless tobacco. He reports that he does not currently use alcohol. He reports that he does not use drugs.  Allergies   Allergen Reactions   Barbiturates Other (See Comments)    Blacks out Blacks out    Seroquel  [Quetiapine]     Other reaction(s): Hallucination   Shellfish Allergy Hives    No family history on file.  Prior to Admission medications   Medication Sig Start Date End Date Taking? Authorizing Provider  acetaminophen (TYLENOL) 500 MG tablet Take 500 mg by mouth in the morning and at bedtime.   Yes [provider]  albuterol (PROVENTIL) (2.5 MG/3ML) 0.083% nebulizer solution Take 2.5 mg by nebulization every 6 (six) hours as needed for wheezing or shortness of breath.   Yes [provider]  albuterol (VENTOLIN HFA) 108 (90 Base) MCG/ACT inhaler Inhale 2 puffs into the lungs every 6 (six) hours as needed for wheezing or shortness of breath.   Yes [provider]  amLODipine (NORVASC) 5 MG tablet Take 5 mg by mouth daily.   Yes [provider]  apixaban (ELIQUIS) 5 MG TABS tablet Take 1 tablet (5 mg total) by mouth 2 (two) times daily. 06/24/22  Yes Shahmehdi, Seyed A, MD  ascorbic acid (VITAMIN C) 500 MG tablet Take 500 mg by mouth daily.   Yes [provider]  aspirin 325 MG tablet Take 325 mg by mouth daily.   Yes [provider]  atorvastatin (LIPITOR) 40 MG tablet Take 1 tablet (40 mg total) by mouth at bedtime. 06/19/22  07/27/22 Yes Shahmehdi, Seyed A, MD  baclofen (LIORESAL) 10 MG tablet Take 10-20 mg by mouth 2 (two) times daily.   Yes [provider]  bisacodyl (DULCOLAX) 5 MG EC tablet Take 1 tablet (5 mg total) by mouth daily. 06/19/22  Yes Shahmehdi, Seyed A, MD  budesonide-formoterol (SYMBICORT) 80-4.5 MCG/ACT inhaler Inhale 2 puffs into the lungs 2 (two) times daily.   Yes [provider]  carbidopa-levodopa (SINEMET CR) 50-200 MG tablet Take 1 tablet by mouth at bedtime. 06/19/22 07/27/22 Yes Shahmehdi, Seyed A, MD  carbidopa-levodopa (SINEMET IR) 25-100 MG tablet Take 2 tablets by mouth 3 (three) times daily. 02/20/21   Yes [provider]  cetirizine (ZYRTEC) 10 MG tablet Take 10 mg by mouth in the morning.    Yes [provider]  cholecalciferol (VITAMIN D3) 25 MCG (1000 UNIT) tablet Take 1,000 Units by mouth in the morning and at bedtime.   Yes [provider]  ferrous sulfate 325 (65 FE) MG EC tablet Take 325 mg by mouth in the morning.    Yes [provider]  furosemide (LASIX) 20 MG tablet Take 5 mg by mouth at bedtime.   Yes [provider]  gabapentin (NEURONTIN) 300 MG capsule Take 3 capsules (900 mg total) by mouth at bedtime. Patient taking differently: Take 300 mg by mouth 3 (three) times daily. 600 mg in the mornring and 600 mg in the afternoon and 900 mg every evening 06/19/22 07/27/22 Yes Shahmehdi, Seyed A, MD  galantamine (RAZADYNE ER) 24 MG 24 hr capsule Take 24 mg by mouth every evening. 02/20/21  Yes [provider]  insulin aspart (NOVOLOG) 100 UNIT/ML injection Inject 1-3 Units into the skin 3 (three) times daily before meals. Per sliding scale   Yes [provider]  insulin detemir (LEVEMIR) 100 UNIT/ML injection Inject 6 Units into the skin every morning.   Yes [provider]  magnesium oxide (MAG-OX) 400 (240 Mg) MG tablet Take 400-800 mg by mouth daily. 2 tablets in the morning and 1 tablet in the evening   Yes [provider]  melatonin 3 MG TABS tablet Take 3 mg by mouth at bedtime.   Yes [provider]  memantine (NAMENDA) 10 MG tablet Take 20 mg by mouth at bedtime.   Yes [provider]  metFORMIN (GLUCOPHAGE) 1000 MG tablet Take 500 mg by mouth 2 (two) times daily with a meal.   Yes [provider]  metoprolol tartrate (LOPRESSOR) 25 MG tablet Take 12.5 mg by mouth 2 (two) times daily.   Yes [provider]  mirabegron ER (MYRBETRIQ) 25 MG TB24 tablet Take 1 tablet (25 mg total) by mouth daily. 06/19/22 08/18/22 Yes Shahmehdi, Gemma Payor, MD  omeprazole (PRILOSEC) 20 MG  capsule Take 20 mg by mouth in the morning.    Yes [provider]  oxyCODONE-acetaminophen (PERCOCET) 10-325 MG tablet Take 1 tablet by mouth every 6 (six) hours as needed for pain.   Yes [provider]  polyethylene glycol powder (GLYCOLAX/MIRALAX) 17 GM/SCOOP powder Take 17 g by mouth daily as needed for mild constipation or moderate constipation.   Yes [provider]  sertraline (ZOLOFT) 100 MG tablet Take 100 mg by mouth in the morning.    Yes [provider]  tamsulosin (FLOMAX) 0.4 MG CAPS capsule Take 0.4 mg by mouth at bedtime.   Yes [provider]  tiotropium (SPIRIVA) 18 MCG inhalation capsule Place 18 mcg into inhaler and inhale in the  morning.    Yes [provider]  VITAMIN A PO Take by mouth.   Yes [provider]  zolpidem (AMBIEN) 10 MG tablet Take 10 mg by mouth at bedtime.   Yes [provider]  apixaban (ELIQUIS) 5 MG TABS tablet Take 2 tablets (10 mg total) by mouth 2 (two) times daily for 6 days. Patient not taking: Reported on 07/27/2022 06/19/22 06/25/22  Kendell Bane, MD  gabapentin (NEURONTIN) 300 MG capsule Take 2 capsules (600 mg total) by mouth 2 (two) times daily with breakfast and lunch. Patient not taking: Reported on 07/27/2022 06/19/22 07/27/22  Kendell Bane, MD  hyoscyamine (LEVSIN SL) 0.125 MG SL tablet Place 1 tablet (0.125 mg total) under the tongue every 4 (four) hours as needed for cramping. Patient not taking: Reported on 07/27/2022 06/19/22   Kendell Bane, MD    Physical Exam: Vitals:   07/27/22 1630 07/27/22 1806 07/27/22 1900 07/27/22 1922  BP: (!) 147/82 (!) 163/81 (!) 144/84   Pulse: 98 100 (!) 115   Resp: 20 (!) 21 (!) 23   Temp: 98.4 F (36.9 C) 98.4 F (36.9 C) 98.4 F (36.9 C)   TempSrc: Rectal Rectal Oral   SpO2:  93% 93% 93%  Weight:      Height:       General: Elderly male. Awake and alert and oriented x3. No acute cardiopulmonary distress.  HEENT:  Normocephalic atraumatic.  Right and left ears normal in appearance.  Pupils equal, round, reactive to light. Extraocular muscles are intact. Sclerae anicteric and noninjected.  Moist mucosal membranes. No mucosal lesions.  Neck: Neck supple without lymphadenopathy. No carotid bruits. No masses palpated.  Cardiovascular: Regular rate with normal S1-S2 sounds. No murmurs, rubs, gallops auscultated. No JVD.  No particular edema peripherally. Respiratory: Some Rales throughout.  No wheezing noted lungs clear to auscultation bilaterally.  No accessory muscle use. Abdomen: Soft, nontender, nondistended. Active bowel sounds. No masses or hepatosplenomegaly  Skin: No rashes, lesions, or ulcerations.  Dry, warm to touch. 2+ dorsalis pedis and radial pulses. Musculoskeletal: No calf or leg pain. All major joints not erythematous nontender.  No upper or lower joint deformation.  Good ROM.  No contractures  Psychiatric: Intact judgment and insight. Pleasant and cooperative. Neurologic: No focal neurological deficits. Strength is 5/5 and symmetric in upper and lower extremities.  Cranial nerves II through XII are grossly intact.  Data Reviewed: Results for orders placed or performed during the hospital encounter of 07/27/22 (from the past 24 hour(s))  CBG monitoring, ED     Status: Abnormal   Collection Time: 07/27/22  2:52 PM  Result Value Ref Range   Glucose-Capillary 173 (H) 70 - 99 mg/dL  CBC     Status: Abnormal   Collection Time: 07/27/22  3:00 PM  Result Value Ref Range   WBC 12.5 (H) 4.0 - 10.5 K/uL   RBC 4.27 4.22 - 5.81 MIL/uL   Hemoglobin 11.5 (L) 13.0 - 17.0 g/dL   HCT 64.4 (L) 03.4 - 74.2 %   MCV 86.4 80.0 - 100.0 fL   MCH 26.9 26.0 - 34.0 pg   MCHC 31.2 30.0 - 36.0 g/dL   RDW 59.5 (H) 63.8 - 75.6 %   Platelets 342 150 - 400 K/uL   nRBC 0.0 0.0 - 0.2 %  Comprehensive metabolic panel     Status: Abnormal   Collection Time: 07/27/22  3:00 PM  Result Value Ref Range   Sodium 137 135 -  145 mmol/L   Potassium 3.8 3.5 - 5.1 mmol/L   Chloride 101 98 - 111 mmol/L   CO2 27 22 - 32 mmol/L   Glucose, Bld 176 (H) 70 - 99 mg/dL   BUN 16 8 - 23 mg/dL   Creatinine, Ser 1.61 0.61 - 1.24 mg/dL   Calcium 8.4 (L) 8.9 - 10.3 mg/dL   Total Protein 6.2 (L) 6.5 - 8.1 g/dL   Albumin 2.6 (L) 3.5 - 5.0 g/dL   AST 15 15 - 41 U/L   ALT 13 0 - 44 U/L   Alkaline Phosphatase 106 38 - 126 U/L   Total Bilirubin 0.8 0.3 - 1.2 mg/dL   GFR, Estimated >09 >60 mL/min   Anion gap 9 5 - 15  Brain natriuretic peptide     Status: Abnormal   Collection Time: 07/27/22  3:00 PM  Result Value Ref Range   B Natriuretic Peptide 232.0 (H) 0.0 - 100.0 pg/mL  Troponin I (High Sensitivity)     Status: None   Collection Time: 07/27/22  3:00 PM  Result Value Ref Range   Troponin I (High Sensitivity) 4 <18 ng/L  SARS Coronavirus 2 by RT PCR (hospital order, performed in [facility] hospital lab) *cepheid single result test* Anterior Nasal Swab     Status: None   Collection Time: 07/27/22  3:20 PM   Specimen: Anterior Nasal Swab  Result Value Ref Range   SARS Coronavirus 2 by RT PCR NEGATIVE NEGATIVE  D-dimer, quantitative     Status: Abnormal   Collection Time: 07/27/22  3:30 PM  Result Value Ref Range   D-Dimer, Quant 0.71 (H) 0.00 - 0.50 ug/mL-FEU  Blood culture (routine x 2)     Status: None (Preliminary result)   Collection Time: 07/27/22  3:43 PM   Specimen: Blood  Result Value Ref Range   Specimen Description      BLOOD LEFT HAND BOTTLES DRAWN AEROBIC AND ANAEROBIC   Special Requests      Blood Culture adequate volume Performed at [facility], [address] [place], [place] [address]    Culture PENDING    Report Status PENDING   Blood gas, venous     Status: Abnormal   Collection Time: 07/27/22  4:46 PM  Result Value Ref Range   pH, Ven 7.32 7.25 - 7.43   pCO2, Ven 62 (H) 44 - 60 mmHg   pO2, Ven <31 (LL) 32 - 45 mmHg   Bicarbonate 31.9 (H) 20.0 - 28.0 mmol/L   Acid-Base Excess 4.0 (H) 0.0  - 2.0 mmol/L   O2 Saturation 16.1 %   Patient temperature 36.9    Collection site HAND    Drawn by [id number]     CT Angio Chest PE W/Cm &/Or Wo Cm  Result Date: 07/27/2022 CLINICAL DATA:  Pulmonary embolism suspected, high probability. Shortness of breath. EXAM: CT ANGIOGRAPHY CHEST WITH CONTRAST TECHNIQUE: Multidetector CT imaging of the chest was performed using the standard protocol during bolus administration of intravenous contrast. Multiplanar CT image reconstructions and MIPs were obtained to evaluate the vascular anatomy. RADIATION DOSE REDUCTION: This exam was performed according to the departmental dose-optimization program which includes automated exposure control, adjustment of the mA and/or kV according to patient size and/or use of iterative reconstruction technique. CONTRAST:  75mL OMNIPAQUE IOHEXOL 350 MG/ML SOLN COMPARISON:  Chest radiography same day. Prior CT angiography 06/17/2022 FINDINGS: Cardiovascular: Heart size is normal. Some coronary artery calcification and aortic atherosclerotic calcification are present. No aneurysm or  dissection. Pulmonary arterial opacification is good. There are no pulmonary emboli. Mediastinum/Nodes: No mediastinal or hilar mass or lymphadenopathy. Lungs/Pleura: Small left effusion layering dependently as seen previously. Worsening of bronchopneumonia in both lower lobes, more extensive on the left than the right. Other scattered patchy areas of pulmonary opacity appear chronic and probably relate to chronic scarring. One exception to that is in the posterior aspect of the left upper lobe which looks like an additional focus of acute pneumonia. Upper Abdomen: Negative Musculoskeletal: No acute spinal finding. Distant cervicothoracic fusion. Old minor compression deformity of T12. Review of the MIP images confirms the above findings. IMPRESSION: 1. No pulmonary emboli. 2. Worsening of bronchopneumonia in both lower lobes, more extensive on the left than the  right. Other scattered patchy areas of pulmonary opacity appear chronic and probably relate to chronic scarring. One exception to that is in the posterior aspect of the left upper lobe which looks like an additional focus of acute pneumonia. 3. Aortic atherosclerosis. Coronary artery calcification. Aortic Atherosclerosis (ICD10-I70.0). Electronically Signed   By: Paulina Fusi M.D.   On: 07/27/2022 18:06   DG Chest Port 1 View  Result Date: 07/27/2022 CLINICAL DATA:  sob EXAM: PORTABLE CHEST 1 VIEW COMPARISON:  June 17, 2022 FINDINGS: The cardiomediastinal silhouette is unchanged in contour.Atherosclerotic calcifications of the tortuous thoracic aorta. No pleural effusion. No pneumothorax. Patchy upper lobe predominant opacities, mildly more rounded and confluent appearing on the LEFT in comparison to prior. Similar bibasilar reticular nodularity. Remote rib fractures. IMPRESSION: Patchy upper lobe predominant opacities. Mildly increased confluence of an area in the LEFT upper lobe in comparison to prior. Findings may reflect multifocal infection or other inflammatory process. Followup PA and lateral chest X-ray is recommended in 3-4 weeks following appropriate treatment to ensure resolution and exclude underlying malignancy. Electronically Signed   By: Meda Klinefelter M.D.   On: 07/27/2022 15:30     Assessment and Plan: No notes have been filed under this hospital service. Service: Hospitalist  Principal Problem:   Acute on chronic respiratory failure with hypoxia (HCC) Active Problems:   Pulmonary embolism (HCC)   Diabetes mellitus (HCC)   Essential hypertension   Dementia with behavioral disturbance (HCC)   History of CVA (cerebrovascular accident)   Hyperlipidemia  Acute on chronic respiratory failure with hypoxia secondary to community-acquired pneumonia and COPD exacerbation Antibiotics: Rocephin and azithromycin Robitussin Blood cultures drawn in the emergency department Sputum  cultures CBC tomorrow Strep and Legionella antigen by urine Influenza screen DuoNeb's every 6 scheduled with albuterol every 2 when necessary Continue inhaled steroids and LA bronchodilator Solu-Medrol 60 mg IV every 12 hours Mucinex History of pulmonary embolism on anticoagulation Continue Eliquis History of stroke Dementia Diabetes type 2 Sliding scale insulin Hypertension Antihypertensives   Advance Care Planning:   Code Status: Prior patient desires no intubation, however desires otherwise to be a full code.  Consults: None  Family Communication: Wife present during interview and exam.  She provides much of the history  Severity of Illness: The appropriate patient status for this patient is INPATIENT. Inpatient status is judged to be reasonable and necessary in order to provide the required intensity of service to ensure the patient's safety. The patient's presenting symptoms, physical exam findings, and initial radiographic and laboratory data in the context of their chronic comorbidities is felt to place them at high risk for further clinical deterioration. Furthermore, it is not anticipated that the patient will be medically stable for discharge from the hospital within 2  midnights of admission.   * I certify that at the point of admission it is my clinical judgment that the patient will require inpatient hospital care spanning beyond 2 midnights from the point of admission due to high intensity of service, high risk for further deterioration and high frequency of surveillance required.*  Author: Levie Heritage, DO 07/27/2022 8:12 PM  For on call review [email or web address].

## 2022-07-27 NOTE — ED Provider Notes (Signed)
[place] EMERGENCY DEPARTMENT AT [facility] Provider Note  CSN: [id number] Arrival date & time: 07/27/22 1446  Chief Complaint(s) Shortness of Breath  HPI Aaron Leon is a 72 y.o. male with a history of dementia COPD, diabetes, recent diagnosis of pulmonary embolism, on Eliquis presenting to the emergency department with shortness of breath.  Patient reports shortness of breath today, then when asked again he reports shortness of breath for a week.  He currently denies any chest pain, cough, fevers, nausea or vomiting, leg swelling, or other symptoms.  He reports chronic unchanged back pain.  Unclear how reliable he is as a historian.  History limited due to underlying dementia.   Past Medical History Past Medical History:  Diagnosis Date   COPD (chronic obstructive pulmonary disease) (HCC)    Diabetes mellitus without complication (HCC)    "type 2"   H/O septic shock    Hypertension    Pulmonary embolism (HCC)    Stroke [facility])    Patient Active Problem List   Diagnosis Date Noted   Pulmonary embolism (HCC) 06/17/2022   Diabetes mellitus (HCC) 06/17/2022   Essential hypertension 06/17/2022   Hyperlipidemia 06/17/2022   Debility 06/17/2022   Dementia with behavioral disturbance (HCC) 06/17/2022    Class: Chronic   Lobar pneumonia (HCC) 06/17/2022   Urinary retention 06/17/2022   Testicular pain 06/17/2022   History of CVA (cerebrovascular accident) 06/17/2022   Home Medication(s) Prior to Admission medications   Medication Sig Start Date End Date Taking? Authorizing Provider  acetaminophen (TYLENOL) 500 MG tablet Take 500 mg by mouth in the morning and at bedtime.    [provider]  albuterol (PROVENTIL) (2.5 MG/3ML) 0.083% nebulizer solution Take 2.5 mg by nebulization every 6 (six) hours as needed for wheezing or shortness of breath.    [provider]  albuterol (VENTOLIN HFA) 108 (90 Base) MCG/ACT inhaler Inhale 1 puff into the lungs  every 6 (six) hours as needed for wheezing or shortness of breath.    [provider]  amLODipine (NORVASC) 5 MG tablet Take 5 mg by mouth daily.    [provider]  apixaban (ELIQUIS) 5 MG TABS tablet Take 2 tablets (10 mg total) by mouth 2 (two) times daily for 6 days. 06/19/22 06/25/22  Shahmehdi, Gemma Payor, MD  apixaban (ELIQUIS) 5 MG TABS tablet Take 1 tablet (5 mg total) by mouth 2 (two) times daily. 06/24/22   Shahmehdi, Gemma Payor, MD  ascorbic acid (VITAMIN C) 500 MG tablet Take 500 mg by mouth daily.    [provider]  aspirin 325 MG tablet Take 325 mg by mouth daily.    [provider]  atorvastatin (LIPITOR) 40 MG tablet Take 1 tablet (40 mg total) by mouth at bedtime. 06/19/22 07/19/22  Shahmehdi, Gemma Payor, MD  baclofen (LIORESAL) 10 MG tablet Take 10-20 mg by mouth 2 (two) times daily.    [provider]  bisacodyl (DULCOLAX) 5 MG EC tablet Take 1 tablet (5 mg total) by mouth daily. 06/19/22   Shahmehdi, Gemma Payor, MD  budesonide-formoterol (SYMBICORT) 80-4.5 MCG/ACT inhaler Inhale 2 puffs into the lungs 2 (two) times daily.    [provider]  carbidopa-levodopa (SINEMET CR) 50-200 MG tablet Take 1 tablet by mouth at bedtime. 06/19/22 07/19/22  Shahmehdi, Gemma Payor, MD  carbidopa-levodopa (SINEMET IR) 25-100 MG tablet Take 2 tablets by mouth 3 (three) times daily. 02/20/21   [provider]  cetirizine (ZYRTEC) 10 MG tablet Take 10  mg by mouth in the morning.     [provider]  cholecalciferol (VITAMIN D3) 25 MCG (1000 UNIT) tablet Take 1,000 Units by mouth in the morning and at bedtime.    [provider]  ferrous sulfate 325 (65 FE) MG EC tablet Take 325 mg by mouth in the morning.     [provider]  furosemide (LASIX) 20 MG tablet Take 5 mg by mouth daily.    [provider]  gabapentin (NEURONTIN) 300 MG capsule Take 2 capsules (600 mg total) by mouth 2 (two) times daily with breakfast and lunch.  06/19/22 07/19/22  Shahmehdi, Gemma Payor, MD  gabapentin (NEURONTIN) 300 MG capsule Take 3 capsules (900 mg total) by mouth at bedtime. 06/19/22 07/19/22  Kendell Bane, MD  galantamine (RAZADYNE ER) 24 MG 24 hr capsule Take 24 mg by mouth every evening. 02/20/21   [provider]  hyoscyamine (LEVSIN SL) 0.125 MG SL tablet Place 1 tablet (0.125 mg total) under the tongue every 4 (four) hours as needed for cramping. 06/19/22   Shahmehdi, Gemma Payor, MD  insulin aspart (NOVOLOG) 100 UNIT/ML injection Inject 1-2 Units into the skin 3 (three) times daily before meals. Per sliding scale    [provider]  insulin detemir (LEVEMIR) 100 UNIT/ML injection Inject 12 Units into the skin every morning.    [provider]  lisinopril (ZESTRIL) 10 MG tablet Take 5 mg by mouth daily.    [provider]  magnesium oxide (MAG-OX) 400 (240 Mg) MG tablet Take 400-800 mg by mouth daily. 2 tablets in the morning and 1 tablet in the evening    [provider]  melatonin 3 MG TABS tablet Take 3 mg by mouth at bedtime.    [provider]  memantine (NAMENDA) 10 MG tablet Take 10 mg by mouth 2 (two) times daily.    [provider]  metFORMIN (GLUCOPHAGE) 1000 MG tablet Take 1,000 mg by mouth 2 (two) times daily with a meal.    [provider]  metoprolol tartrate (LOPRESSOR) 25 MG tablet Take 25 mg by mouth 2 (two) times daily.    [provider]  mirabegron ER (MYRBETRIQ) 25 MG TB24 tablet Take 1 tablet (25 mg total) by mouth daily. 06/19/22 08/18/22  ShahmehdiGemma Payor, MD  omeprazole (PRILOSEC) 20 MG capsule Take 20 mg by mouth in the morning.     [provider]  oxyCODONE-acetaminophen (PERCOCET) 10-325 MG tablet Take 1 tablet by mouth every 6 (six) hours as needed for pain.    [provider]  polyethylene glycol powder (GLYCOLAX/MIRALAX) 17 GM/SCOOP powder Take 17 g by mouth daily as needed for mild constipation or moderate  constipation.    [provider]  sertraline (ZOLOFT) 100 MG tablet Take 100 mg by mouth in the morning.     [provider]  tamsulosin (FLOMAX) 0.4 MG CAPS capsule Take 0.4 mg by mouth at bedtime.    [provider]  tiotropium (SPIRIVA) 18 MCG inhalation capsule Place 18 mcg into inhaler and inhale in the morning.     [provider]  zolpidem (AMBIEN) 10 MG tablet Take 10 mg by mouth at bedtime.    [provider]  Past Surgical History Past Surgical History:  Procedure Laterality Date   BACK SURGERY     CATARACT EXTRACTION     CERVICAL FUSION     HIP PINNING Left    03/2022   KNEE SURGERY     TOTAL HIP ARTHROPLASTY Right 02/2021   Family History No family history on file.  Social History Social History   Tobacco Use   Smoking status: Former    Types: E-cigarettes   Smokeless tobacco: Never  Building services engineer Use: Every day   Substances: Nicotine  Substance Use Topics   Alcohol use: Not Currently   Drug use: Never   Allergies Barbiturates, Seroquel  [quetiapine], and Shellfish allergy  Review of Systems Review of Systems  All other systems reviewed and are negative.   Physical Exam Vital Signs  I have reviewed the triage vital signs BP (!) 163/81 (BP Location: Left Arm)   Pulse 100   Temp 98.4 F (36.9 C) (Rectal)   Resp (!) 21   Ht 6' (1.829 m)   Wt 67.1 kg   SpO2 93%   BMI 20.06 kg/m  Physical Exam Vitals and nursing note reviewed.  Constitutional:      General: He is in acute distress.     Appearance: Normal appearance.  HENT:     Mouth/Throat:     Mouth: Mucous membranes are moist.  Eyes:     Conjunctiva/sclera: Conjunctivae normal.  Neck:     Vascular: No JVD.  Cardiovascular:     Rate and Rhythm: Normal rate and regular rhythm.  Pulmonary:     Effort: Tachypnea,  accessory muscle usage and respiratory distress present.     Breath sounds: No decreased breath sounds or wheezing.  Abdominal:     General: Abdomen is flat.     Palpations: Abdomen is soft.     Tenderness: There is no abdominal tenderness.  Musculoskeletal:     Right lower leg: No edema.     Left lower leg: No edema.  Skin:    General: Skin is warm and dry.     Capillary Refill: Capillary refill takes less than 2 seconds.     Coloration: Skin is pale.  Neurological:     Mental Status: He is alert and oriented to person, place, and time. Mental status is at baseline.  Psychiatric:        Mood and Affect: Mood normal.        Behavior: Behavior normal.     ED Results and Treatments Labs (all labs ordered are listed, but only abnormal results are displayed) Labs Reviewed  CBC - Abnormal; Notable for the following components:      Result Value   WBC 12.5 (*)    Hemoglobin 11.5 (*)    HCT 36.9 (*)    RDW 16.2 (*)    All other components within normal limits  COMPREHENSIVE METABOLIC PANEL - Abnormal; Notable for the following components:   Glucose, Bld 176 (*)    Calcium 8.4 (*)    Total Protein 6.2 (*)    Albumin 2.6 (*)    All other components within normal limits  BRAIN NATRIURETIC PEPTIDE - Abnormal; Notable for the following components:   B Natriuretic Peptide 232.0 (*)    All other components within normal limits  BLOOD GAS, VENOUS - Abnormal; Notable for the following components:   pCO2, Ven 62 (*)    pO2, Ven <31 (*)    Bicarbonate 31.9 (*)  Acid-Base Excess 4.0 (*)    All other components within normal limits  D-DIMER, QUANTITATIVE - Abnormal; Notable for the following components:   D-Dimer, Quant 0.71 (*)    All other components within normal limits  CBG MONITORING, ED - Abnormal; Notable for the following components:   Glucose-Capillary 173 (*)    All other components within normal limits  SARS CORONAVIRUS 2 BY RT PCR  CULTURE, BLOOD (ROUTINE X 2)   CULTURE, BLOOD (ROUTINE X 2)  TROPONIN I (HIGH SENSITIVITY)                                                                                                                          Radiology CT Angio Chest PE W/Cm &/Or Wo Cm  Result Date: 07/27/2022 CLINICAL DATA:  Pulmonary embolism suspected, high probability. Shortness of breath. EXAM: CT ANGIOGRAPHY CHEST WITH CONTRAST TECHNIQUE: Multidetector CT imaging of the chest was performed using the standard protocol during bolus administration of intravenous contrast. Multiplanar CT image reconstructions and MIPs were obtained to evaluate the vascular anatomy. RADIATION DOSE REDUCTION: This exam was performed according to the departmental dose-optimization program which includes automated exposure control, adjustment of the mA and/or kV according to patient size and/or use of iterative reconstruction technique. CONTRAST:  75mL OMNIPAQUE IOHEXOL 350 MG/ML SOLN COMPARISON:  Chest radiography same day. Prior CT angiography 06/17/2022 FINDINGS: Cardiovascular: Heart size is normal. Some coronary artery calcification and aortic atherosclerotic calcification are present. No aneurysm or dissection. Pulmonary arterial opacification is good. There are no pulmonary emboli. Mediastinum/Nodes: No mediastinal or hilar mass or lymphadenopathy. Lungs/Pleura: Small left effusion layering dependently as seen previously. Worsening of bronchopneumonia in both lower lobes, more extensive on the left than the right. Other scattered patchy areas of pulmonary opacity appear chronic and probably relate to chronic scarring. One exception to that is in the posterior aspect of the left upper lobe which looks like an additional focus of acute pneumonia. Upper Abdomen: Negative Musculoskeletal: No acute spinal finding. Distant cervicothoracic fusion. Old minor compression deformity of T12. Review of the MIP images confirms the above findings. IMPRESSION: 1. No pulmonary emboli. 2. Worsening  of bronchopneumonia in both lower lobes, more extensive on the left than the right. Other scattered patchy areas of pulmonary opacity appear chronic and probably relate to chronic scarring. One exception to that is in the posterior aspect of the left upper lobe which looks like an additional focus of acute pneumonia. 3. Aortic atherosclerosis. Coronary artery calcification. Aortic Atherosclerosis (ICD10-I70.0). Electronically Signed   By: Paulina Fusi M.D.   On: 07/27/2022 18:06   DG Chest Port 1 View  Result Date: 07/27/2022 CLINICAL DATA:  sob EXAM: PORTABLE CHEST 1 VIEW COMPARISON:  June 17, 2022 FINDINGS: The cardiomediastinal silhouette is unchanged in contour.Atherosclerotic calcifications of the tortuous thoracic aorta. No pleural effusion. No pneumothorax. Patchy upper lobe predominant opacities, mildly more rounded and confluent appearing on the LEFT in comparison to prior. Similar bibasilar reticular nodularity. Remote rib  fractures. IMPRESSION: Patchy upper lobe predominant opacities. Mildly increased confluence of an area in the LEFT upper lobe in comparison to prior. Findings may reflect multifocal infection or other inflammatory process. Followup PA and lateral chest X-ray is recommended in 3-4 weeks following appropriate treatment to ensure resolution and exclude underlying malignancy. Electronically Signed   By: Meda Klinefelter M.D.   On: 07/27/2022 15:30    Pertinent labs & imaging results that were available during my care of the patient were reviewed by me and considered in my medical decision making (see MDM for details).  Medications Ordered in ED Medications  albuterol (VENTOLIN HFA) 108 (90 Base) MCG/ACT inhaler 2 puff (has no administration in time range)  azithromycin (ZITHROMAX) 500 mg in sodium chloride 0.9 % 250 mL IVPB (500 mg Intravenous New Bag/Given 07/27/22 1640)  ipratropium-albuterol (DUONEB) 0.5-2.5 (3) MG/3ML nebulizer solution 3 mL (3 mLs Nebulization Given 07/27/22  1535)  albuterol (PROVENTIL) (2.5 MG/3ML) 0.083% nebulizer solution 2.5 mg (2.5 mg Nebulization Given 07/27/22 1535)  cefTRIAXone (ROCEPHIN) 2 g in sodium chloride 0.9 % 100 mL IVPB (0 g Intravenous Stopped 07/27/22 1722)  iohexol (OMNIPAQUE) 350 MG/ML injection 75 mL (75 mLs Intravenous Contrast Given 07/27/22 1739)                                                                                                                                     Procedures .Critical Care  Performed by: Lonell Grandchild, MD Authorized by: Lonell Grandchild, MD   Critical care provider statement:    Critical care time (minutes):  30   Critical care was necessary to treat or prevent imminent or life-threatening deterioration of the following conditions:  Respiratory failure   Critical care was time spent personally by me on the following activities:  Development of treatment plan with patient or surrogate, discussions with consultants, evaluation of patient's response to treatment, examination of patient, ordering and review of laboratory studies, ordering and review of radiographic studies, ordering and performing treatments and interventions, pulse oximetry, re-evaluation of patient's condition and review of old charts   (including critical care time)  Medical Decision Making / ED Course   MDM:  72 year old present with shortness of breath.  Patient does have increased work of breathing.  Pulmonary exam limited as patient not willing to sit up in bed even with assistance, but no wheezing heard.  Vitals notable for hypoxia to 84% on 4 L O2.  Chest x-ray demonstrates likely pneumonia.  Will treat with antibiotics.  Will obtain laboratory testing including VBG but without wheezing and good air movement lower concern for COPD exacerbation, will trial breathing treatments.  Patient did recently have pulmonary embolism diagnosed, will discuss with family whether he has been compliant with his Eliquis.  No leg  swelling, JVD to suggest acute CHF exacerbation.  Chest x-ray without evidence of pneumothorax.  Anticipate will need admission.  Clinical Course as of  07/27/22 1831  Sat Jul 27, 2022  1829 Discussed with Dr. Adrian Blackwater who will admit patient. CTA chest without PE. Does re-demonstrate pneumonia.  [WS]    Clinical Course User Index [WS] Lonell Grandchild, MD     Additional history obtained: -Additional history obtained from family -External records from outside source obtained and reviewed including: Chart review including previous notes, labs, imaging, consultation notes including prior admit for PE   Lab Tests: -I ordered, reviewed, and interpreted labs.   The pertinent results include:   Labs Reviewed  CBC - Abnormal; Notable for the following components:      Result Value   WBC 12.5 (*)    Hemoglobin 11.5 (*)    HCT 36.9 (*)    RDW 16.2 (*)    All other components within normal limits  COMPREHENSIVE METABOLIC PANEL - Abnormal; Notable for the following components:   Glucose, Bld 176 (*)    Calcium 8.4 (*)    Total Protein 6.2 (*)    Albumin 2.6 (*)    All other components within normal limits  BRAIN NATRIURETIC PEPTIDE - Abnormal; Notable for the following components:   B Natriuretic Peptide 232.0 (*)    All other components within normal limits  BLOOD GAS, VENOUS - Abnormal; Notable for the following components:   pCO2, Ven 62 (*)    pO2, Ven <31 (*)    Bicarbonate 31.9 (*)    Acid-Base Excess 4.0 (*)    All other components within normal limits  D-DIMER, QUANTITATIVE - Abnormal; Notable for the following components:   D-Dimer, Quant 0.71 (*)    All other components within normal limits  CBG MONITORING, ED - Abnormal; Notable for the following components:   Glucose-Capillary 173 (*)    All other components within normal limits  SARS CORONAVIRUS 2 BY RT PCR  CULTURE, BLOOD (ROUTINE X 2)  CULTURE, BLOOD (ROUTINE X 2)  TROPONIN I (HIGH SENSITIVITY)    Notable for  elevated d-dimer, leukocytosis  EKG   EKG Interpretation  Date/Time:  Saturday Jul 27 2022 15:20:08 EDT Ventricular Rate:  83 PR Interval:  158 QRS Duration: 85 QT Interval:  360 QTC Calculation: 423 R Axis:   -80 Text Interpretation: Sinus rhythm Low voltage, extremity leads Consider inferior infarct No significant change since last tracing Confirmed by Alvino Blood ([id number]) on 07/27/2022 3:30:47 PM         Imaging Studies ordered: I ordered imaging studies including CXR, CTA chest On my interpretation imaging demonstrates pneumonia I independently visualized and interpreted imaging. I agree with the radiologist interpretation   Medicines ordered and prescription drug management: Meds ordered this encounter  Medications   DISCONTD: albuterol (PROVENTIL) (2.5 MG/3ML) 0.083% nebulizer solution 5 mg   DISCONTD: ipratropium (ATROVENT) nebulizer solution 0.5 mg   albuterol (VENTOLIN HFA) 108 (90 Base) MCG/ACT inhaler 2 puff   ipratropium-albuterol (DUONEB) 0.5-2.5 (3) MG/3ML nebulizer solution 3 mL   albuterol (PROVENTIL) (2.5 MG/3ML) 0.083% nebulizer solution 2.5 mg   cefTRIAXone (ROCEPHIN) 2 g in sodium chloride 0.9 % 100 mL IVPB    Order Specific Question:   Antibiotic Indication:    Answer:   CAP   azithromycin (ZITHROMAX) 500 mg in sodium chloride 0.9 % 250 mL IVPB    Order Specific Question:   Antibiotic Indication:    Answer:   CAP   iohexol (OMNIPAQUE) 350 MG/ML injection 75 mL    -I have reviewed the patients home medicines and have made adjustments as needed  Consultations Obtained: I requested consultation with the hospitalist,  and discussed lab and imaging findings as well as pertinent plan - they recommend: admission   Cardiac Monitoring: The patient was maintained on a cardiac monitor.  I personally viewed and interpreted the cardiac monitored which showed an underlying rhythm of: NSR  Social Determinants of Health:  Diagnosis or treatment  significantly limited by social determinants of health: vapes nicotine   Reevaluation: After the interventions noted above, I reevaluated the patient and found that their symptoms have improved  Co morbidities that complicate the patient evaluation  Past Medical History:  Diagnosis Date   COPD (chronic obstructive pulmonary disease) (HCC)    Diabetes mellitus without complication (HCC)    "type 2"   H/O septic shock    Hypertension    Pulmonary embolism (HCC)    Stroke (HCC)       Dispostion: Disposition decision including need for hospitalization was considered, and patient admitted to the hospital.    Final Clinical Impression(s) / ED Diagnoses Final diagnoses:  Community acquired pneumonia of left upper lobe of lung  Acute hypoxic respiratory failure (HCC)     This chart was dictated using voice recognition software.  Despite best efforts to proofread,  errors can occur which can change the documentation meaning.    Lonell Grandchild, MD 07/27/22 [phone]

## 2022-07-27 NOTE — ED Triage Notes (Signed)
Pt arrived [facility] with c/o SOB. Pt called EMS due to hypoglycemia and staff gave pt glucose and BS went up to 201. EMS noted pt was SOB. 4 Lpm was 84% . A nonrebreather applied at 10 lpm to maintain oxygen above 90%. Daughter stated pt has had a decrease in PO intake and No bm x 5 days. 20 G IV in Right AC.

## 2022-07-27 NOTE — ED Notes (Signed)
Patient repositioned in bed, eschar noted to right heel.

## 2022-07-28 DIAGNOSIS — J9621 Acute and chronic respiratory failure with hypoxia: Secondary | ICD-10-CM | POA: Diagnosis not present

## 2022-07-28 LAB — GLUCOSE, CAPILLARY
Glucose-Capillary: 230 mg/dL — ABNORMAL HIGH (ref 70–99)
Glucose-Capillary: 176 mg/dL — ABNORMAL HIGH (ref 70–99)
Glucose-Capillary: 203 mg/dL — ABNORMAL HIGH (ref 70–99)
Glucose-Capillary: 233 mg/dL — ABNORMAL HIGH (ref 70–99)
Glucose-Capillary: 185 mg/dL — ABNORMAL HIGH (ref 70–99)

## 2022-07-28 LAB — CBC
HCT: 35.6 % — ABNORMAL LOW (ref 39.0–52.0)
Hemoglobin: 11.4 g/dL — ABNORMAL LOW (ref 13.0–17.0)
MCH: 27.3 pg (ref 26.0–34.0)
MCHC: 32 g/dL (ref 30.0–36.0)
MCV: 85.4 fL (ref 80.0–100.0)
Platelets: 309 10*3/uL (ref 150–400)
RBC: 4.17 MIL/uL — ABNORMAL LOW (ref 4.22–5.81)
RDW: 16.4 % — ABNORMAL HIGH (ref 11.5–15.5)
WBC: 17 10*3/uL — ABNORMAL HIGH (ref 4.0–10.5)
nRBC: 0 % (ref 0.0–0.2)

## 2022-07-28 LAB — BASIC METABOLIC PANEL
Anion gap: 12 (ref 5–15)
BUN: 16 mg/dL (ref 8–23)
CO2: 24 mmol/L (ref 22–32)
Calcium: 8.1 mg/dL — ABNORMAL LOW (ref 8.9–10.3)
Chloride: 103 mmol/L (ref 98–111)
Creatinine, Ser: 0.74 mg/dL (ref 0.61–1.24)
GFR, Estimated: >60 mL/min (ref >60)
Glucose, Bld: 250 mg/dL — ABNORMAL HIGH (ref 70–99)
Potassium: 3.8 mmol/L (ref 3.5–5.1)
Sodium: 139 mmol/L (ref 135–145)

## 2022-07-28 LAB — STREP PNEUMONIAE URINARY ANTIGEN: Strep Pneumo Urinary Antigen: NEGATIVE

## 2022-07-28 LAB — MRSA NEXT GEN BY PCR, NASAL: MRSA by PCR Next Gen: NOT DETECTED

## 2022-07-28 LAB — CULTURE, BLOOD (ROUTINE X 2)

## 2022-07-28 MED ORDER — CHLORHEXIDINE GLUCONATE CLOTH 2 % EX PADS
6.0000 | MEDICATED_PAD | Freq: Every day | CUTANEOUS | Status: DC
  Administered 2022-07-28 – 2022-07-30 (×3): 6 via TOPICAL

## 2022-07-28 MED ORDER — METHYLPREDNISOLONE SODIUM SUCC 125 MG IJ SOLR
60.0000 mg | Freq: Two times a day (BID) | INTRAMUSCULAR | Status: DC
  Administered 2022-07-28 (×2): 60 mg via INTRAVENOUS
  Filled 2022-07-28 (×2): qty 2

## 2022-07-28 NOTE — TOC Initial Note (Addendum)
Transition of Care [facility]) - Initial/Assessment Note    Patient Details  Name: Aaron Leon MRN: [id number] Date of Birth: 30-Sep-1950  Transition of Care (TOC) CM/SW Contact:    Catalina Gravel, LCSW Phone Number: 07/28/2022, 5:31 PM  Clinical Narrative:                 CSW completed [facility] ECR: ID [phone]. CSW assessed pt at bedside. Patient was less responsive, said he was leaving today. Seemed confused. CSW spoke spouse. Pt lives with spouse, has a ramp on home,for his wc. Spouse assists with ADL's and there is an Aid 5 days a week for 5 hours a day and this is through [employer]. A RN visits 1 time  a week. Wife provides transportation to appointments.  Pt is seen at [facility]. Pt currently has has HH PT and OT 2 times per week.  DME includes electric wheelchair, lift, rails in bathroom, hospital bed, BSC and walker.  TOC to follow.  Expected Discharge Plan: Home/Self Care Barriers to Discharge: Continued Medical Work up   Patient Goals and CMS Choice Patient states their goals for this hospitalization and ongoing recovery are:: Home          Expected Discharge Plan and Services In-house Referral: Clinical Social Work     Living arrangements for the past 2 months: Single Family Home                   DME Agency:  Librarian, academic)                  Prior Living Arrangements/Services Living arrangements for the past 2 months: Single Family Home Lives with:: Spouse Patient language and need for interpreter reviewed:: Yes Do you feel safe going back to the place where you live?: Yes      Need for Family Participation in Patient Care: Yes (Comment) Care giver support system in place?: Yes (comment) Current home services: DME, Home PT, Home OT, Home RN (Svc arranged by [facility]) Criminal Activity/Legal Involvement Pertinent to Current Situation/Hospitalization: No - Comment as needed  Activities of Daily Living Home Assistive Devices/Equipment: Oxygen, Bedside  commode/3-in-1, Walker (specify type), Nebulizer, Hearing aid, CBG Meter, Wheelchair, Raised toilet seat with rails, Grab bars around toilet, Grab bars in shower, Shower chair with back, Dentures (specify type), Sock aid, Reacher, Hand-held shower hose ADL Screening (condition at time of admission) Patient's cognitive ability adequate to safely complete daily activities?: No Is the patient deaf or have difficulty hearing?: Yes Does the patient have difficulty seeing, even when wearing glasses/contacts?: No Does the patient have difficulty concentrating, remembering, or making decisions?: Yes Patient able to express need for assistance with ADLs?: Yes Does the patient have difficulty dressing or bathing?: Yes Independently performs ADLs?: No Communication: Needs assistance Is this a change from baseline?: Pre-admission baseline Dressing (OT): Needs assistance Is this a change from baseline?: Pre-admission baseline Grooming: Needs assistance Is this a change from baseline?: Pre-admission baseline Feeding: Independent Bathing: Needs assistance Is this a change from baseline?: Pre-admission baseline Toileting: Needs assistance Is this a change from baseline?: Pre-admission baseline In/Out Bed: Needs assistance Is this a change from baseline?: Pre-admission baseline Does the patient have difficulty walking or climbing stairs?: Yes Weakness of Legs: Both Weakness of Arms/Hands: Both  Permission Sought/Granted                  Emotional Assessment Appearance:: Appears stated age Attitude/Demeanor/Rapport: Gracious Affect (typically observed): Appropriate Orientation: : Oriented  to Self, Oriented to Place   Psych Involvement: No (comment)  Admission diagnosis:  Acute on chronic respiratory failure with hypoxia (HCC) [J96.21] Community acquired pneumonia of left upper lobe of lung [J18.9] Acute hypoxic respiratory failure (HCC) [J96.01] Patient Active Problem List   Diagnosis  Date Noted   Acute on chronic respiratory failure with hypoxia (HCC) 07/27/2022   CAP (community acquired pneumonia) 07/27/2022   Pulmonary embolism (HCC) 06/17/2022   Diabetes mellitus (HCC) 06/17/2022   Essential hypertension 06/17/2022   Hyperlipidemia 06/17/2022   Debility 06/17/2022   Dementia with behavioral disturbance (HCC) 06/17/2022    Class: Chronic   Lobar pneumonia (HCC) 06/17/2022   Urinary retention 06/17/2022   Testicular pain 06/17/2022   History of CVA (cerebrovascular accident) 06/17/2022   PCP:  [name] Pharmacy:   [id number] - [place], [place] - [address] [place] [address] [place] [address] Phone: [phone] Fax: [phone]  [facility] - [place], [place] - [address] [place] [address] Phone: [phone] Fax: [phone]     Social Determinants of Health (SDOH) Social History: SDOH Screenings   Food Insecurity: No Food Insecurity (07/27/2022)  Housing: Low Risk  (07/27/2022)  Transportation Needs: No Transportation Needs (07/27/2022)  Utilities: Not At Risk (07/27/2022)  Tobacco Use: Medium Risk (07/08/2022)   SDOH Interventions:     Readmission Risk Interventions    07/28/2022    5:22 PM  Readmission Risk Prevention Plan  Transportation Screening Complete  PCP or Specialist Appt within 3-5 Days Complete  HRI or Home Care Consult Complete  Social Work Consult for Recovery Care Planning/Counseling Complete  Palliative Care Screening Not Applicable  Medication Review Oceanographer) Complete

## 2022-07-28 NOTE — Progress Notes (Signed)
PROGRESS NOTE    Aaron Leon  UJW:[id number] DOB: 1950/12/04 DOA: 07/27/2022 PCP: [name]   Brief Narrative:    Aaron Leon is a 72 y.o. male with medical history significant of type 2 diabetes, COPD, hypertension, history of pulmonary embolism on Eliquis, history of stroke.  Patient presenting with worsening shortness of breath, fatigue, decreased appetite that started suddenly on the night prior to admission.  He has been admitted with acute on chronic hypoxemic respiratory failure secondary to acute COPD exacerbation in the setting of community-acquired pneumonia.  Assessment & Plan:   Principal Problem:   Acute on chronic respiratory failure with hypoxia (HCC) Active Problems:   Pulmonary embolism (HCC)   Diabetes mellitus (HCC)   Essential hypertension   Dementia with behavioral disturbance (HCC)   History of CVA (cerebrovascular accident)   Hyperlipidemia   CAP (community acquired pneumonia)  Assessment and Plan:   Acute on chronic respiratory failure with hypoxia secondary to community-acquired pneumonia and COPD exacerbation Antibiotics: Rocephin and azithromycin Robitussin Blood cultures drawn in the emergency department Sputum cultures CBC tomorrow Strep and Legionella antigen by urine Influenza screen DuoNeb's every 6 scheduled with albuterol every 2 when necessary Continue inhaled steroids and LA bronchodilator Solu-Medrol 60 mg IV every 12 hours Mucinex Continue to wean oxygen to 2 L baseline History of pulmonary embolism on anticoagulation Continue Eliquis History of stroke Dementia Diabetes type 2 Sliding scale insulin Hypertension Antihypertensives    DVT prophylaxis: Eliquis Code Status: Full Family Communication: None at bedside Disposition Plan:  Status is: Inpatient Remains inpatient appropriate because: Need for IV medications.   Skin Assessment:  I have examined the patient's skin and I agree with the wound  assessment as performed by the wound care RN as outlined below:  Pressure Injury 07/27/22 Heel Right Deep Tissue Pressure Injury - Purple or maroon localized area of discolored intact skin or blood-filled blister due to damage of underlying soft tissue from pressure and/or shear. (Active)  07/27/22 2000  Location: Heel  Location Orientation: Right  Staging: Deep Tissue Pressure Injury - Purple or maroon localized area of discolored intact skin or blood-filled blister due to damage of underlying soft tissue from pressure and/or shear.  Wound Description (Comments):   Present on Admission:   Dressing Type Foam - Lift dressing to assess site every shift 07/28/22 0800    Consultants:  None  Procedures:  None  Antimicrobials:  Anti-infectives (From admission, onward)    Start     Dose/Rate Route Frequency Ordered Stop   07/28/22 1800  cefTRIAXone (ROCEPHIN) 2 g in sodium chloride 0.9 % 100 mL IVPB        2 g 200 mL/hr over 30 Minutes Intravenous Every 24 hours 07/27/22 2022 08/01/22 1759   07/27/22 1545  cefTRIAXone (ROCEPHIN) 2 g in sodium chloride 0.9 % 100 mL IVPB        2 g 200 mL/hr over 30 Minutes Intravenous  Once 07/27/22 1543 07/27/22 1722   07/27/22 1545  azithromycin (ZITHROMAX) 500 mg in sodium chloride 0.9 % 250 mL IVPB        500 mg 250 mL/hr over 60 Minutes Intravenous Every 24 hours 07/27/22 1543         Subjective: Patient seen and evaluated today with no new acute complaints or concerns. No acute concerns or events noted overnight.  He seems to feel that his shortness of breath is improving.  Objective: Vitals:   07/28/22 0718 07/28/22 0800 07/28/22 0825 07/28/22  0900  BP:  (!) 166/79  (!) 126/50  Pulse: 81 77  76  Resp: (!) 21 16  11   Temp: 98 F (36.7 C)     TempSrc: Oral     SpO2: 97% 98% 99% 96%  Weight:      Height:        Intake/Output Summary (Last 24 hours) at 07/28/2022 0953 Last data filed at 07/28/2022 0600 Gross per 24 hour  Intake 550 ml   Output 650 ml  Net -100 ml   Filed Weights   07/27/22 1458 07/27/22 1503  Weight: 66.2 kg 67.1 kg    Examination:  General exam: Appears calm and comfortable  Respiratory system: Clear to auscultation. Respiratory effort normal.  15 L high flow nasal cannula Cardiovascular system: S1 & S2 heard, RRR.  Gastrointestinal system: Abdomen is soft Central nervous system: Alert and awake Extremities: No edema Skin: No significant lesions noted Psychiatry: Flat affect.    Data Reviewed: I have personally reviewed following labs and imaging studies  CBC: Recent Labs  Lab 07/27/22 1500 07/28/22 0344  WBC 12.5* 17.0*  HGB 11.5* 11.4*  HCT 36.9* 35.6*  MCV 86.4 85.4  PLT 342 309   Basic Metabolic Panel: Recent Labs  Lab 07/27/22 1500 07/28/22 0344  NA 137 139  K 3.8 3.8  CL 101 103  CO2 27 24  GLUCOSE 176* 250*  BUN 16 16  CREATININE 0.75 0.74  CALCIUM 8.4* 8.1*   GFR: Estimated Creatinine Clearance: 78.1 mL/min (by C-G formula based on SCr of 0.74 mg/dL). Liver Function Tests: Recent Labs  Lab 07/27/22 1500  AST 15  ALT 13  ALKPHOS 106  BILITOT 0.8  PROT 6.2*  ALBUMIN 2.6*   No results for input(s): "LIPASE", "AMYLASE" in the last 168 hours. No results for input(s): "AMMONIA" in the last 168 hours. Coagulation Profile: No results for input(s): "INR", "PROTIME" in the last 168 hours. Cardiac Enzymes: No results for input(s): "CKTOTAL", "CKMB", "CKMBINDEX", "TROPONINI" in the last 168 hours. BNP (last 3 results) No results for input(s): "PROBNP" in the last 8760 hours. HbA1C: No results for input(s): "HGBA1C" in the last 72 hours. CBG: Recent Labs  Lab 07/27/22 1452 07/28/22 0715  GLUCAP 173* 230*   Lipid Profile: No results for input(s): "CHOL", "HDL", "LDLCALC", "TRIG", "CHOLHDL", "LDLDIRECT" in the last 72 hours. Thyroid Function Tests: No results for input(s): "TSH", "T4TOTAL", "FREET4", "T3FREE", "THYROIDAB" in the last 72 hours. Anemia  Panel: No results for input(s): "VITAMINB12", "FOLATE", "FERRITIN", "TIBC", "IRON", "RETICCTPCT" in the last 72 hours. Sepsis Labs: No results for input(s): "PROCALCITON", "LATICACIDVEN" in the last 168 hours.  Recent Results (from the past 240 hour(s))  SARS Coronavirus 2 by RT PCR (hospital order, performed in [facility] hospital lab) *cepheid single result test* Anterior Nasal Swab     Status: None   Collection Time: 07/27/22  3:20 PM   Specimen: Anterior Nasal Swab  Result Value Ref Range Status   SARS Coronavirus 2 by RT PCR NEGATIVE NEGATIVE Final    Comment: (NOTE) SARS-CoV-2 target nucleic acids are NOT DETECTED.  The SARS-CoV-2 RNA is generally detectable in upper and lower respiratory specimens during the acute phase of infection. The lowest concentration of SARS-CoV-2 viral copies this assay can detect is 250 copies / mL. A negative result does not preclude SARS-CoV-2 infection and should not be used as the sole basis for treatment or other patient management decisions.  A negative result may occur with improper specimen collection /  handling, submission of specimen other than nasopharyngeal swab, presence of viral mutation(s) within the areas targeted by this assay, and inadequate number of viral copies (<250 copies / mL). A negative result must be combined with clinical observations, patient history, and epidemiological information.  Fact Sheet for Patients:   RoadLapTop.co.za  Fact Sheet for Healthcare Providers: http://kim-miller.com/  This test is not yet approved or  cleared by the [place] FDA and has been authorized for detection and/or diagnosis of SARS-CoV-2 by FDA under an Emergency Use Authorization (EUA).  This EUA will remain in effect (meaning this test can be used) for the duration of the COVID-19 declaration under Section 564(b)(1) of the Act, 21 U.S.C. section 360bbb-3(b)(1), unless the authorization  is terminated or revoked sooner.  Performed at [facility], [address], [place], [place] [address]   Blood culture (routine x 2)     Status: None (Preliminary result)   Collection Time: 07/27/22  3:43 PM   Specimen: BLOOD LEFT HAND  Result Value Ref Range Status   Specimen Description   Final    BLOOD LEFT HAND BOTTLES DRAWN AEROBIC AND ANAEROBIC   Special Requests Blood Culture adequate volume  Final   Culture   Final    NO GROWTH < 24 HOURS Performed at [facility], [address] [place], [place] [address]    Report Status PENDING  Incomplete  Blood culture (routine x 2)     Status: None (Preliminary result)   Collection Time: 07/27/22  3:48 PM   Specimen: BLOOD  Result Value Ref Range Status   Specimen Description BLOOD  Final   Special Requests NONE  Final   Culture   Final    NO GROWTH < 24 HOURS Performed at [facility], [address], [place], [place] [address]    Report Status PENDING  Incomplete  MRSA Next Gen by PCR, Nasal     Status: None   Collection Time: 07/27/22  8:44 PM   Specimen: Nasal Mucosa; Nasal Swab  Result Value Ref Range Status   MRSA by PCR Next Gen NOT DETECTED NOT DETECTED Final    Comment: (NOTE) The GeneXpert MRSA Assay (FDA approved for NASAL specimens only), is one component of a comprehensive MRSA colonization surveillance program. It is not intended to diagnose MRSA infection nor to guide or monitor treatment for MRSA infections. Test performance is not FDA approved in patients less than 35 years old. Performed at [facility], [address] [place], [place] [address]          Radiology Studies: CT Angio Chest PE W/Cm &/Or Wo Cm  Result Date: 07/27/2022 CLINICAL DATA:  Pulmonary embolism suspected, high probability. Shortness of breath. EXAM: CT ANGIOGRAPHY CHEST WITH CONTRAST TECHNIQUE: Multidetector CT imaging of the chest was performed using the standard protocol during bolus administration of intravenous contrast.  Multiplanar CT image reconstructions and MIPs were obtained to evaluate the vascular anatomy. RADIATION DOSE REDUCTION: This exam was performed according to the departmental dose-optimization program which includes automated exposure control, adjustment of the mA and/or kV according to patient size and/or use of iterative reconstruction technique. CONTRAST:  75mL OMNIPAQUE IOHEXOL 350 MG/ML SOLN COMPARISON:  Chest radiography same day. Prior CT angiography 06/17/2022 FINDINGS: Cardiovascular: Heart size is normal. Some coronary artery calcification and aortic atherosclerotic calcification are present. No aneurysm or dissection. Pulmonary arterial opacification is good. There are no pulmonary emboli. Mediastinum/Nodes: No mediastinal or hilar mass or lymphadenopathy. Lungs/Pleura: Small left effusion layering dependently as seen  previously. Worsening of bronchopneumonia in both lower lobes, more extensive on the left than the right. Other scattered patchy areas of pulmonary opacity appear chronic and probably relate to chronic scarring. One exception to that is in the posterior aspect of the left upper lobe which looks like an additional focus of acute pneumonia. Upper Abdomen: Negative Musculoskeletal: No acute spinal finding. Distant cervicothoracic fusion. Old minor compression deformity of T12. Review of the MIP images confirms the above findings. IMPRESSION: 1. No pulmonary emboli. 2. Worsening of bronchopneumonia in both lower lobes, more extensive on the left than the right. Other scattered patchy areas of pulmonary opacity appear chronic and probably relate to chronic scarring. One exception to that is in the posterior aspect of the left upper lobe which looks like an additional focus of acute pneumonia. 3. Aortic atherosclerosis. Coronary artery calcification. Aortic Atherosclerosis (ICD10-I70.0). Electronically Signed   By: Paulina Fusi M.D.   On: 07/27/2022 18:06   DG Chest Port 1 View  Result Date:  07/27/2022 CLINICAL DATA:  sob EXAM: PORTABLE CHEST 1 VIEW COMPARISON:  June 17, 2022 FINDINGS: The cardiomediastinal silhouette is unchanged in contour.Atherosclerotic calcifications of the tortuous thoracic aorta. No pleural effusion. No pneumothorax. Patchy upper lobe predominant opacities, mildly more rounded and confluent appearing on the LEFT in comparison to prior. Similar bibasilar reticular nodularity. Remote rib fractures. IMPRESSION: Patchy upper lobe predominant opacities. Mildly increased confluence of an area in the LEFT upper lobe in comparison to prior. Findings may reflect multifocal infection or other inflammatory process. Followup PA and lateral chest X-ray is recommended in 3-4 weeks following appropriate treatment to ensure resolution and exclude underlying malignancy. Electronically Signed   By: Meda Klinefelter M.D.   On: 07/27/2022 15:30        Scheduled Meds:  amLODipine  5 mg Oral Daily   apixaban  5 mg Oral BID   atorvastatin  40 mg Oral QHS   bisacodyl  5 mg Oral Daily   carbidopa-levodopa  1 tablet Oral QHS   carbidopa-levodopa  2 tablet Oral TID with meals   Chlorhexidine Gluconate Cloth  6 each Topical Daily   fluticasone furoate-vilanterol  1 puff Inhalation Daily   furosemide  10 mg Oral QHS   gabapentin  600 mg Oral BID   And   gabapentin  900 mg Oral QHS   galantamine  24 mg Oral QPM   insulin aspart  0-20 Units Subcutaneous TID WC   insulin aspart  0-5 Units Subcutaneous QHS   memantine  20 mg Oral QHS   methylPREDNISolone (SOLU-MEDROL) injection  60 mg Intravenous Q12H   metoprolol tartrate  12.5 mg Oral BID   mirabegron ER  25 mg Oral Daily   pantoprazole  40 mg Oral Daily   sertraline  100 mg Oral q AM   tamsulosin  0.4 mg Oral QHS   umeclidinium bromide  1 puff Inhalation q AM   zolpidem  5 mg Oral QHS   Continuous Infusions:  azithromycin Stopped (07/27/22 1908)   cefTRIAXone (ROCEPHIN)  IV       LOS: 1 day    Time spent: 35  minutes    Kazimierz Springborn Hoover Brunette, DO [facility]  If 7PM-7AM, please contact night-coverage [email or web address] 07/28/2022, 9:53 AM

## 2022-07-29 DIAGNOSIS — J9621 Acute and chronic respiratory failure with hypoxia: Secondary | ICD-10-CM | POA: Diagnosis not present

## 2022-07-29 LAB — GLUCOSE, CAPILLARY
Glucose-Capillary: 187 mg/dL — ABNORMAL HIGH (ref 70–99)
Glucose-Capillary: 222 mg/dL — ABNORMAL HIGH (ref 70–99)
Glucose-Capillary: 197 mg/dL — ABNORMAL HIGH (ref 70–99)
Glucose-Capillary: 176 mg/dL — ABNORMAL HIGH (ref 70–99)
Glucose-Capillary: 334 mg/dL — ABNORMAL HIGH (ref 70–99)

## 2022-07-29 LAB — CBC
HCT: 31.9 % — ABNORMAL LOW (ref 39.0–52.0)
Hemoglobin: 10 g/dL — ABNORMAL LOW (ref 13.0–17.0)
MCH: 26.9 pg (ref 26.0–34.0)
MCHC: 31.3 g/dL (ref 30.0–36.0)
MCV: 85.8 fL (ref 80.0–100.0)
Platelets: 306 10*3/uL (ref 150–400)
RBC: 3.72 MIL/uL — ABNORMAL LOW (ref 4.22–5.81)
RDW: 16.2 % — ABNORMAL HIGH (ref 11.5–15.5)
WBC: 10.7 10*3/uL — ABNORMAL HIGH (ref 4.0–10.5)
nRBC: 0 % (ref 0.0–0.2)

## 2022-07-29 LAB — BASIC METABOLIC PANEL
Anion gap: 8 (ref 5–15)
BUN: 24 mg/dL — ABNORMAL HIGH (ref 8–23)
CO2: 26 mmol/L (ref 22–32)
Calcium: 8.2 mg/dL — ABNORMAL LOW (ref 8.9–10.3)
Chloride: 104 mmol/L (ref 98–111)
Creatinine, Ser: 0.7 mg/dL (ref 0.61–1.24)
GFR, Estimated: >60 mL/min (ref >60)
Glucose, Bld: 216 mg/dL — ABNORMAL HIGH (ref 70–99)
Potassium: 3.5 mmol/L (ref 3.5–5.1)
Sodium: 138 mmol/L (ref 135–145)

## 2022-07-29 LAB — MAGNESIUM: Magnesium: 1.6 mg/dL — ABNORMAL LOW (ref 1.7–2.4)

## 2022-07-29 MED ORDER — METHYLPREDNISOLONE SODIUM SUCC 40 MG IJ SOLR
40.0000 mg | Freq: Two times a day (BID) | INTRAMUSCULAR | Status: DC
  Administered 2022-07-29 – 2022-07-30 (×3): 40 mg via INTRAVENOUS
  Filled 2022-07-29 (×3): qty 1

## 2022-07-29 MED ORDER — MAGNESIUM SULFATE 2 GM/50ML IV SOLN
2.0000 g | Freq: Once | INTRAVENOUS | Status: AC
  Administered 2022-07-29: 2 g via INTRAVENOUS
  Filled 2022-07-29: qty 50

## 2022-07-29 NOTE — Progress Notes (Signed)
PROGRESS NOTE    Aaron Leon  AOZ:[id number] DOB: 03-16-1951 DOA: 07/27/2022 PCP: [name]   Brief Narrative:    Aaron Leon is a 73 y.o. male with medical history significant of type 2 diabetes, COPD, hypertension, history of pulmonary embolism on Eliquis, history of stroke.  Patient presenting with worsening shortness of breath, fatigue, decreased appetite that started suddenly on the night prior to admission.  He has been admitted with acute on chronic hypoxemic respiratory failure secondary to acute COPD exacerbation in the setting of community-acquired pneumonia.  Assessment & Plan:   Principal Problem:   Acute on chronic respiratory failure with hypoxia (HCC) Active Problems:   Pulmonary embolism (HCC)   Diabetes mellitus (HCC)   Essential hypertension   Dementia with behavioral disturbance (HCC)   History of CVA (cerebrovascular accident)   Hyperlipidemia   CAP (community acquired pneumonia)  Assessment and Plan:   Acute on chronic respiratory failure with hypoxia secondary to community-acquired pneumonia and COPD exacerbation Antibiotics: Rocephin and azithromycin Robitussin Blood cultures with no growth to date Sputum cultures CBC tomorrow Strep and Legionella antigen by urine Influenza screen DuoNeb's every 6 scheduled with albuterol every 2 when necessary Continue inhaled steroids and LA bronchodilator Solu-Medrol 40 mg IV every 12 hours Mucinex Continue to wean oxygen to 2 L baseline History of pulmonary embolism on anticoagulation Continue Eliquis History of stroke Dementia Diabetes type 2 Sliding scale insulin Hypertension Antihypertensives    DVT prophylaxis: Eliquis Code Status: Full Family Communication: None at bedside Disposition Plan:  Status is: Inpatient Remains inpatient appropriate because: Need for IV medications.   Skin Assessment:  I have examined the patient's skin and I agree with the wound assessment as  performed by the wound care RN as outlined below:  Pressure Injury 07/27/22 Heel Right Deep Tissue Pressure Injury - Purple or maroon localized area of discolored intact skin or blood-filled blister due to damage of underlying soft tissue from pressure and/or shear. (Active)  07/27/22 2000  Location: Heel  Location Orientation: Right  Staging: Deep Tissue Pressure Injury - Purple or maroon localized area of discolored intact skin or blood-filled blister due to damage of underlying soft tissue from pressure and/or shear.  Wound Description (Comments):   Present on Admission:   Dressing Type Foam - Lift dressing to assess site every shift 07/28/22 1935    Consultants:  None  Procedures:  None  Antimicrobials:  Anti-infectives (From admission, onward)    Start     Dose/Rate Route Frequency Ordered Stop   07/28/22 1800  cefTRIAXone (ROCEPHIN) 2 g in sodium chloride 0.9 % 100 mL IVPB        2 g 200 mL/hr over 30 Minutes Intravenous Every 24 hours 07/27/22 2022 08/01/22 1759   07/27/22 1545  cefTRIAXone (ROCEPHIN) 2 g in sodium chloride 0.9 % 100 mL IVPB        2 g 200 mL/hr over 30 Minutes Intravenous  Once 07/27/22 1543 07/27/22 1722   07/27/22 1545  azithromycin (ZITHROMAX) 500 mg in sodium chloride 0.9 % 250 mL IVPB        500 mg 250 mL/hr over 60 Minutes Intravenous Every 24 hours 07/27/22 1543         Subjective: Patient seen and evaluated today with no new acute complaints or concerns. No acute concerns or events noted overnight.  He seems to feel that his shortness of breath is improving.  Objective: Vitals:   07/29/22 0744 07/29/22 0746 07/29/22 0750 07/29/22  0800  BP:    (!) 114/44  Pulse: 60   97  Resp: 20   20  Temp:   97.7 F (36.5 C)   TempSrc:   Axillary   SpO2: 96% 96%  94%  Weight:      Height:        Intake/Output Summary (Last 24 hours) at 07/29/2022 0829 Last data filed at 07/29/2022 0500 Gross per 24 hour  Intake 657.67 ml  Output 675 ml  Net -17.33  ml   Filed Weights   07/27/22 1458 07/27/22 1503  Weight: 66.2 kg 67.1 kg    Examination:  General exam: Appears calm and comfortable  Respiratory system: Clear to auscultation. Respiratory effort normal.  3L  Cardiovascular system: S1 & S2 heard, RRR.  Gastrointestinal system: Abdomen is soft Central nervous system: Alert and awake Extremities: No edema Skin: No significant lesions noted Psychiatry: Flat affect.    Data Reviewed: I have personally reviewed following labs and imaging studies  CBC: Recent Labs  Lab 07/27/22 1500 07/28/22 0344 07/29/22 0357  WBC 12.5* 17.0* 10.7*  HGB 11.5* 11.4* 10.0*  HCT 36.9* 35.6* 31.9*  MCV 86.4 85.4 85.8  PLT 342 309 306   Basic Metabolic Panel: Recent Labs  Lab 07/27/22 1500 07/28/22 0344 07/29/22 0357  NA 137 139 138  K 3.8 3.8 3.5  CL 101 103 104  CO2 27 24 26   GLUCOSE 176* 250* 216*  BUN 16 16 24*  CREATININE 0.75 0.74 0.70  CALCIUM 8.4* 8.1* 8.2*  MG  --   --  1.6*   GFR: Estimated Creatinine Clearance: 78.1 mL/min (by C-G formula based on SCr of 0.7 mg/dL). Liver Function Tests: Recent Labs  Lab 07/27/22 1500  AST 15  ALT 13  ALKPHOS 106  BILITOT 0.8  PROT 6.2*  ALBUMIN 2.6*   No results for input(s): "LIPASE", "AMYLASE" in the last 168 hours. No results for input(s): "AMMONIA" in the last 168 hours. Coagulation Profile: No results for input(s): "INR", "PROTIME" in the last 168 hours. Cardiac Enzymes: No results for input(s): "CKTOTAL", "CKMB", "CKMBINDEX", "TROPONINI" in the last 168 hours. BNP (last 3 results) No results for input(s): "PROBNP" in the last 8760 hours. HbA1C: No results for input(s): "HGBA1C" in the last 72 hours. CBG: Recent Labs  Lab 07/28/22 1123 07/28/22 1455 07/28/22 1623 07/28/22 2040 07/29/22 0721  GLUCAP 176* 203* 233* 185* 222*   Lipid Profile: No results for input(s): "CHOL", "HDL", "LDLCALC", "TRIG", "CHOLHDL", "LDLDIRECT" in the last 72 hours. Thyroid  Function Tests: No results for input(s): "TSH", "T4TOTAL", "FREET4", "T3FREE", "THYROIDAB" in the last 72 hours. Anemia Panel: No results for input(s): "VITAMINB12", "FOLATE", "FERRITIN", "TIBC", "IRON", "RETICCTPCT" in the last 72 hours. Sepsis Labs: No results for input(s): "PROCALCITON", "LATICACIDVEN" in the last 168 hours.  Recent Results (from the past 240 hour(s))  SARS Coronavirus 2 by RT PCR (hospital order, performed in [facility] hospital lab) *cepheid single result test* Anterior Nasal Swab     Status: None   Collection Time: 07/27/22  3:20 PM   Specimen: Anterior Nasal Swab  Result Value Ref Range Status   SARS Coronavirus 2 by RT PCR NEGATIVE NEGATIVE Final    Comment: (NOTE) SARS-CoV-2 target nucleic acids are NOT DETECTED.  The SARS-CoV-2 RNA is generally detectable in upper and lower respiratory specimens during the acute phase of infection. The lowest concentration of SARS-CoV-2 viral copies this assay can detect is 250 copies / mL. A negative result does not preclude  SARS-CoV-2 infection and should not be used as the sole basis for treatment or other patient management decisions.  A negative result may occur with improper specimen collection / handling, submission of specimen other than nasopharyngeal swab, presence of viral mutation(s) within the areas targeted by this assay, and inadequate number of viral copies (<250 copies / mL). A negative result must be combined with clinical observations, patient history, and epidemiological information.  Fact Sheet for Patients:   RoadLapTop.co.za  Fact Sheet for Healthcare Providers: http://kim-miller.com/  This test is not yet approved or  cleared by the [place] FDA and has been authorized for detection and/or diagnosis of SARS-CoV-2 by FDA under an Emergency Use Authorization (EUA).  This EUA will remain in effect (meaning this test can be used) for the duration of  the COVID-19 declaration under Section 564(b)(1) of the Act, 21 U.S.C. section 360bbb-3(b)(1), unless the authorization is terminated or revoked sooner.  Performed at [facility], [address], [place], [place] [address]   Blood culture (routine x 2)     Status: None (Preliminary result)   Collection Time: 07/27/22  3:43 PM   Specimen: BLOOD LEFT HAND  Result Value Ref Range Status   Specimen Description   Final    BLOOD LEFT HAND BOTTLES DRAWN AEROBIC AND ANAEROBIC   Special Requests Blood Culture adequate volume  Final   Culture   Final    NO GROWTH < 24 HOURS Performed at [facility], [address], [place], [place] [address]    Report Status PENDING  Incomplete  Blood culture (routine x 2)     Status: None (Preliminary result)   Collection Time: 07/27/22  3:48 PM   Specimen: BLOOD  Result Value Ref Range Status   Specimen Description BLOOD  Final   Special Requests NONE  Final   Culture   Final    NO GROWTH < 24 HOURS Performed at [facility], [address], [place], [place] [address]    Report Status PENDING  Incomplete  MRSA Next Gen by PCR, Nasal     Status: None   Collection Time: 07/27/22  8:44 PM   Specimen: Nasal Mucosa; Nasal Swab  Result Value Ref Range Status   MRSA by PCR Next Gen NOT DETECTED NOT DETECTED Final    Comment: (NOTE) The GeneXpert MRSA Assay (FDA approved for NASAL specimens only), is one component of a comprehensive MRSA colonization surveillance program. It is not intended to diagnose MRSA infection nor to guide or monitor treatment for MRSA infections. Test performance is not FDA approved in patients less than 30 years old. Performed at [facility], [address], [place], [place] [address]          Radiology Studies: CT Angio Chest PE W/Cm &/Or Wo Cm  Result Date: 07/27/2022 CLINICAL DATA:  Pulmonary embolism suspected, high probability. Shortness of breath. EXAM: CT ANGIOGRAPHY CHEST WITH CONTRAST TECHNIQUE: Multidetector CT  imaging of the chest was performed using the standard protocol during bolus administration of intravenous contrast. Multiplanar CT image reconstructions and MIPs were obtained to evaluate the vascular anatomy. RADIATION DOSE REDUCTION: This exam was performed according to the departmental dose-optimization program which includes automated exposure control, adjustment of the mA and/or kV according to patient size and/or use of iterative reconstruction technique. CONTRAST:  75mL OMNIPAQUE IOHEXOL 350 MG/ML SOLN COMPARISON:  Chest radiography same day. Prior CT angiography 06/17/2022 FINDINGS: Cardiovascular: Heart size is normal. Some coronary artery calcification and aortic atherosclerotic calcification are present. No  aneurysm or dissection. Pulmonary arterial opacification is good. There are no pulmonary emboli. Mediastinum/Nodes: No mediastinal or hilar mass or lymphadenopathy. Lungs/Pleura: Small left effusion layering dependently as seen previously. Worsening of bronchopneumonia in both lower lobes, more extensive on the left than the right. Other scattered patchy areas of pulmonary opacity appear chronic and probably relate to chronic scarring. One exception to that is in the posterior aspect of the left upper lobe which looks like an additional focus of acute pneumonia. Upper Abdomen: Negative Musculoskeletal: No acute spinal finding. Distant cervicothoracic fusion. Old minor compression deformity of T12. Review of the MIP images confirms the above findings. IMPRESSION: 1. No pulmonary emboli. 2. Worsening of bronchopneumonia in both lower lobes, more extensive on the left than the right. Other scattered patchy areas of pulmonary opacity appear chronic and probably relate to chronic scarring. One exception to that is in the posterior aspect of the left upper lobe which looks like an additional focus of acute pneumonia. 3. Aortic atherosclerosis. Coronary artery calcification. Aortic Atherosclerosis  (ICD10-I70.0). Electronically Signed   By: Paulina Fusi M.D.   On: 07/27/2022 18:06   DG Chest Port 1 View  Result Date: 07/27/2022 CLINICAL DATA:  sob EXAM: PORTABLE CHEST 1 VIEW COMPARISON:  June 17, 2022 FINDINGS: The cardiomediastinal silhouette is unchanged in contour.Atherosclerotic calcifications of the tortuous thoracic aorta. No pleural effusion. No pneumothorax. Patchy upper lobe predominant opacities, mildly more rounded and confluent appearing on the LEFT in comparison to prior. Similar bibasilar reticular nodularity. Remote rib fractures. IMPRESSION: Patchy upper lobe predominant opacities. Mildly increased confluence of an area in the LEFT upper lobe in comparison to prior. Findings may reflect multifocal infection or other inflammatory process. Followup PA and lateral chest X-ray is recommended in 3-4 weeks following appropriate treatment to ensure resolution and exclude underlying malignancy. Electronically Signed   By: Meda Klinefelter M.D.   On: 07/27/2022 15:30        Scheduled Meds:  amLODipine  5 mg Oral Daily   apixaban  5 mg Oral BID   atorvastatin  40 mg Oral QHS   bisacodyl  5 mg Oral Daily   carbidopa-levodopa  1 tablet Oral QHS   carbidopa-levodopa  2 tablet Oral TID with meals   Chlorhexidine Gluconate Cloth  6 each Topical Daily   fluticasone furoate-vilanterol  1 puff Inhalation Daily   furosemide  10 mg Oral QHS   gabapentin  600 mg Oral BID   And   gabapentin  900 mg Oral QHS   galantamine  24 mg Oral QPM   insulin aspart  0-20 Units Subcutaneous TID WC   insulin aspart  0-5 Units Subcutaneous QHS   memantine  20 mg Oral QHS   methylPREDNISolone (SOLU-MEDROL) injection  40 mg Intravenous Q12H   metoprolol tartrate  12.5 mg Oral BID   mirabegron ER  25 mg Oral Daily   pantoprazole  40 mg Oral Daily   sertraline  100 mg Oral q AM   tamsulosin  0.4 mg Oral QHS   umeclidinium bromide  1 puff Inhalation q AM   zolpidem  5 mg Oral QHS   Continuous  Infusions:  azithromycin 500 mg (07/28/22 1612)   cefTRIAXone (ROCEPHIN)  IV 2 g (07/28/22 1721)   magnesium sulfate bolus IVPB 2 g (07/29/22 0802)     LOS: 2 days    Time spent: 35 minutes    Tryniti Laatsch Hoover Brunette, DO [facility]  If 7PM-7AM, please contact night-coverage [email or web address] 07/29/2022, 8:29 AM

## 2022-07-30 DIAGNOSIS — J9621 Acute and chronic respiratory failure with hypoxia: Secondary | ICD-10-CM | POA: Diagnosis not present

## 2022-07-30 LAB — BASIC METABOLIC PANEL
Anion gap: 8 (ref 5–15)
BUN: 22 mg/dL (ref 8–23)
CO2: 27 mmol/L (ref 22–32)
Calcium: 7.8 mg/dL — ABNORMAL LOW (ref 8.9–10.3)
Chloride: 99 mmol/L (ref 98–111)
Creatinine, Ser: 0.7 mg/dL (ref 0.61–1.24)
GFR, Estimated: >60 mL/min (ref >60)
Glucose, Bld: 291 mg/dL — ABNORMAL HIGH (ref 70–99)
Potassium: 3.6 mmol/L (ref 3.5–5.1)
Sodium: 134 mmol/L — ABNORMAL LOW (ref 135–145)

## 2022-07-30 LAB — CBC
HCT: 31.6 % — ABNORMAL LOW (ref 39.0–52.0)
Hemoglobin: 10 g/dL — ABNORMAL LOW (ref 13.0–17.0)
MCH: 26.9 pg (ref 26.0–34.0)
MCHC: 31.6 g/dL (ref 30.0–36.0)
MCV: 84.9 fL (ref 80.0–100.0)
Platelets: 315 10*3/uL (ref 150–400)
RBC: 3.72 MIL/uL — ABNORMAL LOW (ref 4.22–5.81)
RDW: 16 % — ABNORMAL HIGH (ref 11.5–15.5)
WBC: 9 10*3/uL (ref 4.0–10.5)
nRBC: 0 % (ref 0.0–0.2)

## 2022-07-30 LAB — CULTURE, BLOOD (ROUTINE X 2): Culture: NO GROWTH

## 2022-07-30 LAB — MAGNESIUM: Magnesium: 1.5 mg/dL — ABNORMAL LOW (ref 1.7–2.4)

## 2022-07-30 LAB — GLUCOSE, CAPILLARY
Glucose-Capillary: 336 mg/dL — ABNORMAL HIGH (ref 70–99)
Glucose-Capillary: 223 mg/dL — ABNORMAL HIGH (ref 70–99)

## 2022-07-30 LAB — LEGIONELLA PNEUMOPHILA SEROGP 1 UR AG: L. pneumophila Serogp 1 Ur Ag: NEGATIVE

## 2022-07-30 MED ORDER — PREDNISONE 10 MG PO TABS: 40.0000 mg | ORAL_TABLET | Freq: Every day | ORAL | 0 refills | Status: AC

## 2022-07-30 MED ORDER — AMOXICILLIN-POT CLAVULANATE 875-125 MG PO TABS: 1.0000 | ORAL_TABLET | Freq: Two times a day (BID) | ORAL | 0 refills | Status: AC

## 2022-07-30 MED ORDER — MAGNESIUM SULFATE 2 GM/50ML IV SOLN
2.0000 g | Freq: Once | INTRAVENOUS | Status: AC
  Administered 2022-07-30: 2 g via INTRAVENOUS
  Filled 2022-07-30: qty 50

## 2022-07-30 NOTE — Discharge Summary (Signed)
Physician Discharge Summary  Aaron Leon ZOX:[id number] DOB: 03/31/1950 DOA: 07/27/2022  PCP: [name]  Admit date: 07/27/2022  Discharge date: 07/30/2022  Admitted From:Home  Disposition:  Home  Recommendations for Outpatient Follow-up:  Follow up with PCP in 1-2 weeks Continue on prednisone as prescribed for 5 more days Continue on Augmentin as prescribed for 4 more days Continue home breathing treatments as needed Continue other home medications as prior  Home Health: Continue home health  Equipment/Devices: Has home equipment  Discharge Condition:Stable  CODE STATUS: Full  Diet recommendation: Heart Healthy/carb modified  Brief/Interim Summary:  Aaron Leon is a 72 y.o. male with medical history significant of type 2 diabetes, COPD, hypertension, history of pulmonary embolism on Eliquis, history of stroke.  Patient presenting with worsening shortness of breath, fatigue, decreased appetite that started suddenly on the night prior to admission.  He has been admitted with acute on chronic hypoxemic respiratory failure secondary to acute COPD exacerbation in the setting of community-acquired pneumonia.  Patient initially had an oxygen requirement on admission and was started on IV Solu-Medrol as well as Rocephin and azithromycin.  He has improved back to baseline and no longer has an oxygen requirement and is in stable condition for discharge home.  He will remain on medications as noted above with no other acute events or concerns noted.  Discharge Diagnoses:  Principal Problem:   Acute on chronic respiratory failure with hypoxia (HCC) Active Problems:   Pulmonary embolism (HCC)   Diabetes mellitus (HCC)   Essential hypertension   Dementia with behavioral disturbance (HCC)   History of CVA (cerebrovascular accident)   Hyperlipidemia   CAP (community acquired pneumonia)  Principal discharge diagnosis: Acute on chronic hypoxemic respiratory failure  secondary to community-acquired pneumonia with associated acute COPD exacerbation.  Discharge Instructions  Discharge Instructions     Diet - low sodium heart healthy   Complete by: As directed    Increase activity slowly   Complete by: As directed    No wound care   Complete by: As directed       Allergies as of 07/30/2022       Reactions   Barbiturates Other (See Comments)   Blacks out Blacks out   Seroquel  [quetiapine]    Other reaction(s): Hallucination   Shellfish Allergy Hives        Medication List     TAKE these medications    acetaminophen 500 MG tablet Commonly known as: TYLENOL Take 500 mg by mouth in the morning and at bedtime.   albuterol 108 (90 Base) MCG/ACT inhaler Commonly known as: VENTOLIN HFA Inhale 2 puffs into the lungs every 6 (six) hours as needed for wheezing or shortness of breath.   albuterol (2.5 MG/3ML) 0.083% nebulizer solution Commonly known as: PROVENTIL Take 2.5 mg by nebulization every 6 (six) hours as needed for wheezing or shortness of breath.   amLODipine 5 MG tablet Commonly known as: NORVASC Take 5 mg by mouth daily.   amoxicillin-clavulanate 875-125 MG tablet Commonly known as: AUGMENTIN Take 1 tablet by mouth 2 (two) times daily for 4 days.   apixaban 5 MG Tabs tablet Commonly known as: ELIQUIS Take 2 tablets (10 mg total) by mouth 2 (two) times daily for 6 days.   apixaban 5 MG Tabs tablet Commonly known as: ELIQUIS Take 1 tablet (5 mg total) by mouth 2 (two) times daily.   ascorbic acid 500 MG tablet Commonly known as: VITAMIN C Take 500 mg by  mouth daily.   aspirin 325 MG tablet Take 325 mg by mouth daily.   atorvastatin 40 MG tablet Commonly known as: LIPITOR Take 1 tablet (40 mg total) by mouth at bedtime.   baclofen 10 MG tablet Commonly known as: LIORESAL Take 10-20 mg by mouth 2 (two) times daily.   bisacodyl 5 MG EC tablet Commonly known as: DULCOLAX Take 1 tablet (5 mg total) by mouth  daily.   budesonide-formoterol 80-4.5 MCG/ACT inhaler Commonly known as: SYMBICORT Inhale 2 puffs into the lungs 2 (two) times daily.   carbidopa-levodopa 25-100 MG tablet Commonly known as: SINEMET IR Take 2 tablets by mouth 3 (three) times daily.   carbidopa-levodopa 50-200 MG tablet Commonly known as: SINEMET CR Take 1 tablet by mouth at bedtime.   cetirizine 10 MG tablet Commonly known as: ZYRTEC Take 10 mg by mouth in the morning.   cholecalciferol 25 MCG (1000 UNIT) tablet Commonly known as: VITAMIN D3 Take 1,000 Units by mouth in the morning and at bedtime.   ferrous sulfate 325 (65 FE) MG EC tablet Take 325 mg by mouth in the morning.   furosemide 20 MG tablet Commonly known as: LASIX Take 5 mg by mouth at bedtime.   gabapentin 300 MG capsule Commonly known as: NEURONTIN Take 2 capsules (600 mg total) by mouth 2 (two) times daily with breakfast and lunch. What changed: Another medication with the same name was changed. Make sure you understand how and when to take each.   gabapentin 300 MG capsule Commonly known as: NEURONTIN Take 3 capsules (900 mg total) by mouth at bedtime. What changed:  how much to take when to take this additional instructions   galantamine 24 MG 24 hr capsule Commonly known as: RAZADYNE ER Take 24 mg by mouth every evening.   hyoscyamine 0.125 MG SL tablet Commonly known as: LEVSIN SL Place 1 tablet (0.125 mg total) under the tongue every 4 (four) hours as needed for cramping.   insulin aspart 100 UNIT/ML injection Commonly known as: novoLOG Inject 1-3 Units into the skin 3 (three) times daily before meals. Per sliding scale   insulin detemir 100 UNIT/ML injection Commonly known as: LEVEMIR Inject 6 Units into the skin every morning.   magnesium oxide 400 (240 Mg) MG tablet Commonly known as: MAG-OX Take 400-800 mg by mouth daily. 2 tablets in the morning and 1 tablet in the evening   melatonin 3 MG Tabs tablet Take 3 mg  by mouth at bedtime.   memantine 10 MG tablet Commonly known as: NAMENDA Take 20 mg by mouth at bedtime.   metFORMIN 1000 MG tablet Commonly known as: GLUCOPHAGE Take 500 mg by mouth 2 (two) times daily with a meal.   metoprolol tartrate 25 MG tablet Commonly known as: LOPRESSOR Take 12.5 mg by mouth 2 (two) times daily.   mirabegron ER 25 MG Tb24 tablet Commonly known as: MYRBETRIQ Take 1 tablet (25 mg total) by mouth daily.   omeprazole 20 MG capsule Commonly known as: PRILOSEC Take 20 mg by mouth in the morning.   oxyCODONE-acetaminophen 10-325 MG tablet Commonly known as: PERCOCET Take 1 tablet by mouth every 6 (six) hours as needed for pain.   polyethylene glycol powder 17 GM/SCOOP powder Commonly known as: GLYCOLAX/MIRALAX Take 17 g by mouth daily as needed for mild constipation or moderate constipation.   predniSONE 10 MG tablet Commonly known as: DELTASONE Take 4 tablets (40 mg total) by mouth daily for 5 days.   sertraline 100  MG tablet Commonly known as: ZOLOFT Take 100 mg by mouth in the morning.   tamsulosin 0.4 MG Caps capsule Commonly known as: FLOMAX Take 0.4 mg by mouth at bedtime.   tiotropium 18 MCG inhalation capsule Commonly known as: SPIRIVA Place 18 mcg into inhaler and inhale in the morning.   VITAMIN A PO Take by mouth.   zolpidem 10 MG tablet Commonly known as: AMBIEN Take 10 mg by mouth at bedtime.        Follow-up Information     [facility] Schedule an appointment as soon as possible for a visit in 1 week(s).   Contact information: [address] [facility] [place] [address] [phone]                Allergies  Allergen Reactions   Barbiturates Other (See Comments)    Blacks out Blacks out    Seroquel  [Quetiapine]     Other reaction(s): Hallucination   Shellfish Allergy Hives    Consultations: None   Procedures/Studies: CT Angio Chest PE W/Cm &/Or Wo Cm  Result Date:  07/27/2022 CLINICAL DATA:  Pulmonary embolism suspected, high probability. Shortness of breath. EXAM: CT ANGIOGRAPHY CHEST WITH CONTRAST TECHNIQUE: Multidetector CT imaging of the chest was performed using the standard protocol during bolus administration of intravenous contrast. Multiplanar CT image reconstructions and MIPs were obtained to evaluate the vascular anatomy. RADIATION DOSE REDUCTION: This exam was performed according to the departmental dose-optimization program which includes automated exposure control, adjustment of the mA and/or kV according to patient size and/or use of iterative reconstruction technique. CONTRAST:  75mL OMNIPAQUE IOHEXOL 350 MG/ML SOLN COMPARISON:  Chest radiography same day. Prior CT angiography 06/17/2022 FINDINGS: Cardiovascular: Heart size is normal. Some coronary artery calcification and aortic atherosclerotic calcification are present. No aneurysm or dissection. Pulmonary arterial opacification is good. There are no pulmonary emboli. Mediastinum/Nodes: No mediastinal or hilar mass or lymphadenopathy. Lungs/Pleura: Small left effusion layering dependently as seen previously. Worsening of bronchopneumonia in both lower lobes, more extensive on the left than the right. Other scattered patchy areas of pulmonary opacity appear chronic and probably relate to chronic scarring. One exception to that is in the posterior aspect of the left upper lobe which looks like an additional focus of acute pneumonia. Upper Abdomen: Negative Musculoskeletal: No acute spinal finding. Distant cervicothoracic fusion. Old minor compression deformity of T12. Review of the MIP images confirms the above findings. IMPRESSION: 1. No pulmonary emboli. 2. Worsening of bronchopneumonia in both lower lobes, more extensive on the left than the right. Other scattered patchy areas of pulmonary opacity appear chronic and probably relate to chronic scarring. One exception to that is in the posterior aspect of the  left upper lobe which looks like an additional focus of acute pneumonia. 3. Aortic atherosclerosis. Coronary artery calcification. Aortic Atherosclerosis (ICD10-I70.0). Electronically Signed   By: Paulina Fusi M.D.   On: 07/27/2022 18:06   DG Chest Port 1 View  Result Date: 07/27/2022 CLINICAL DATA:  sob EXAM: PORTABLE CHEST 1 VIEW COMPARISON:  June 17, 2022 FINDINGS: The cardiomediastinal silhouette is unchanged in contour.Atherosclerotic calcifications of the tortuous thoracic aorta. No pleural effusion. No pneumothorax. Patchy upper lobe predominant opacities, mildly more rounded and confluent appearing on the LEFT in comparison to prior. Similar bibasilar reticular nodularity. Remote rib fractures. IMPRESSION: Patchy upper lobe predominant opacities. Mildly increased confluence of an area in the LEFT upper lobe in comparison to prior. Findings may reflect multifocal infection or other inflammatory process. Followup  PA and lateral chest X-ray is recommended in 3-4 weeks following appropriate treatment to ensure resolution and exclude underlying malignancy. Electronically Signed   By: Meda Klinefelter M.D.   On: 07/27/2022 15:30     Discharge Exam: Vitals:   07/30/22 0719 07/30/22 0845  BP:  (!) 129/57  Pulse:  60  Resp:    Temp:    SpO2: 95%    Vitals:   07/29/22 2122 07/30/22 0517 07/30/22 0719 07/30/22 0845  BP: (!) 111/53 122/61  (!) 129/57  Pulse: 88 64  60  Resp: 19 19    Temp: 98.8 F (37.1 C) 98.8 F (37.1 C)    TempSrc: Oral Oral    SpO2: 93% 94% 95%   Weight:      Height:        General: Pt is alert, awake, not in acute distress Cardiovascular: RRR, S1/S2 +, no rubs, no gallops Respiratory: CTA bilaterally, no wheezing, no rhonchi Abdominal: Soft, NT, ND, bowel sounds + Extremities: no edema, no cyanosis    The results of significant diagnostics from this hospitalization (including imaging, microbiology, ancillary and laboratory) are listed below for reference.      Microbiology: Recent Results (from the past 240 hour(s))  SARS Coronavirus 2 by RT PCR (hospital order, performed in [facility] hospital lab) *cepheid single result test* Anterior Nasal Swab     Status: None   Collection Time: 07/27/22  3:20 PM   Specimen: Anterior Nasal Swab  Result Value Ref Range Status   SARS Coronavirus 2 by RT PCR NEGATIVE NEGATIVE Final    Comment: (NOTE) SARS-CoV-2 target nucleic acids are NOT DETECTED.  The SARS-CoV-2 RNA is generally detectable in upper and lower respiratory specimens during the acute phase of infection. The lowest concentration of SARS-CoV-2 viral copies this assay can detect is 250 copies / mL. A negative result does not preclude SARS-CoV-2 infection and should not be used as the sole basis for treatment or other patient management decisions.  A negative result may occur with improper specimen collection / handling, submission of specimen other than nasopharyngeal swab, presence of viral mutation(s) within the areas targeted by this assay, and inadequate number of viral copies (<250 copies / mL). A negative result must be combined with clinical observations, patient history, and epidemiological information.  Fact Sheet for Patients:   RoadLapTop.co.za  Fact Sheet for Healthcare Providers: http://kim-miller.com/  This test is not yet approved or  cleared by the [place] FDA and has been authorized for detection and/or diagnosis of SARS-CoV-2 by FDA under an Emergency Use Authorization (EUA).  This EUA will remain in effect (meaning this test can be used) for the duration of the COVID-19 declaration under Section 564(b)(1) of the Act, 21 U.S.C. section 360bbb-3(b)(1), unless the authorization is terminated or revoked sooner.  Performed at [facility], [address] [place], [place] [address]   Blood culture (routine x 2)     Status: None (Preliminary result)   Collection Time:  07/27/22  3:43 PM   Specimen: BLOOD LEFT HAND  Result Value Ref Range Status   Specimen Description   Final    BLOOD LEFT HAND BOTTLES DRAWN AEROBIC AND ANAEROBIC   Special Requests Blood Culture adequate volume  Final   Culture   Final    NO GROWTH 3 DAYS Performed at [facility], [address] [place], [place] [address]    Report Status PENDING  Incomplete  Blood culture (routine x 2)     Status:  None (Preliminary result)   Collection Time: 07/27/22  3:48 PM   Specimen: BLOOD  Result Value Ref Range Status   Specimen Description BLOOD  Final   Special Requests NONE  Final   Culture   Final    NO GROWTH 3 DAYS Performed at [facility], [address] [place], [place] [address]    Report Status PENDING  Incomplete  MRSA Next Gen by PCR, Nasal     Status: None   Collection Time: 07/27/22  8:44 PM   Specimen: Nasal Mucosa; Nasal Swab  Result Value Ref Range Status   MRSA by PCR Next Gen NOT DETECTED NOT DETECTED Final    Comment: (NOTE) The GeneXpert MRSA Assay (FDA approved for NASAL specimens only), is one component of a comprehensive MRSA colonization surveillance program. It is not intended to diagnose MRSA infection nor to guide or monitor treatment for MRSA infections. Test performance is not FDA approved in patients less than 50 years old. Performed at [facility], [address] [place], [place] [address]      Labs: BNP (last 3 results) Recent Labs    07/27/22 1500  BNP 232.0*   Basic Metabolic Panel: Recent Labs  Lab 07/27/22 1500 07/28/22 0344 07/29/22 0357 07/30/22 0442  NA 137 139 138 134*  K 3.8 3.8 3.5 3.6  CL 101 103 104 99  CO2 27 24 26 27   GLUCOSE 176* 250* 216* 291*  BUN 16 16 24* 22  CREATININE 0.75 0.74 0.70 0.70  CALCIUM 8.4* 8.1* 8.2* 7.8*  MG  --   --  1.6* 1.5*   Liver Function Tests: Recent Labs  Lab 07/27/22 1500  AST 15  ALT 13  ALKPHOS 106  BILITOT 0.8  PROT 6.2*  ALBUMIN 2.6*   No results for input(s): "LIPASE",  "AMYLASE" in the last 168 hours. No results for input(s): "AMMONIA" in the last 168 hours. CBC: Recent Labs  Lab 07/27/22 1500 07/28/22 0344 07/29/22 0357 07/30/22 0442  WBC 12.5* 17.0* 10.7* 9.0  HGB 11.5* 11.4* 10.0* 10.0*  HCT 36.9* 35.6* 31.9* 31.6*  MCV 86.4 85.4 85.8 84.9  PLT 342 309 306 315   Cardiac Enzymes: No results for input(s): "CKTOTAL", "CKMB", "CKMBINDEX", "TROPONINI" in the last 168 hours. BNP: Invalid input(s): "POCBNP" CBG: Recent Labs  Lab 07/29/22 0721 07/29/22 1125 07/29/22 1624 07/29/22 2125 07/30/22 0706  GLUCAP 222* 197* 176* 334* 336*   D-Dimer Recent Labs    07/27/22 1530  DDIMER 0.71*   Hgb A1c No results for input(s): "HGBA1C" in the last 72 hours. Lipid Profile No results for input(s): "CHOL", "HDL", "LDLCALC", "TRIG", "CHOLHDL", "LDLDIRECT" in the last 72 hours. Thyroid function studies No results for input(s): "TSH", "T4TOTAL", "T3FREE", "THYROIDAB" in the last 72 hours.  Invalid input(s): "FREET3" Anemia work up No results for input(s): "VITAMINB12", "FOLATE", "FERRITIN", "TIBC", "IRON", "RETICCTPCT" in the last 72 hours. Urinalysis    Component Value Date/Time   COLORURINE RED (A) 06/22/2022 1207   APPEARANCEUR CLOUDY (A) 06/22/2022 1207   LABSPEC 1.011 06/22/2022 1207   PHURINE 7.0 06/22/2022 1207   GLUCOSEU NEGATIVE 06/22/2022 1207   HGBUR LARGE (A) 06/22/2022 1207   BILIRUBINUR NEGATIVE 06/22/2022 1207   KETONESUR NEGATIVE 06/22/2022 1207   PROTEINUR 100 (A) 06/22/2022 1207   NITRITE NEGATIVE 06/22/2022 1207   LEUKOCYTESUR TRACE (A) 06/22/2022 1207   Sepsis Labs Recent Labs  Lab 07/27/22 1500 07/28/22 0344 07/29/22 0357 07/30/22 0442  WBC 12.5* 17.0* 10.7* 9.0   Microbiology Recent Results (  from the past 240 hour(s))  SARS Coronavirus 2 by RT PCR (hospital order, performed in [facility] hospital lab) *cepheid single result test* Anterior Nasal Swab     Status: None   Collection Time: 07/27/22  3:20 PM    Specimen: Anterior Nasal Swab  Result Value Ref Range Status   SARS Coronavirus 2 by RT PCR NEGATIVE NEGATIVE Final    Comment: (NOTE) SARS-CoV-2 target nucleic acids are NOT DETECTED.  The SARS-CoV-2 RNA is generally detectable in upper and lower respiratory specimens during the acute phase of infection. The lowest concentration of SARS-CoV-2 viral copies this assay can detect is 250 copies / mL. A negative result does not preclude SARS-CoV-2 infection and should not be used as the sole basis for treatment or other patient management decisions.  A negative result may occur with improper specimen collection / handling, submission of specimen other than nasopharyngeal swab, presence of viral mutation(s) within the areas targeted by this assay, and inadequate number of viral copies (<250 copies / mL). A negative result must be combined with clinical observations, patient history, and epidemiological information.  Fact Sheet for Patients:   RoadLapTop.co.za  Fact Sheet for Healthcare Providers: http://kim-miller.com/  This test is not yet approved or  cleared by the [place] FDA and has been authorized for detection and/or diagnosis of SARS-CoV-2 by FDA under an Emergency Use Authorization (EUA).  This EUA will remain in effect (meaning this test can be used) for the duration of the COVID-19 declaration under Section 564(b)(1) of the Act, 21 U.S.C. section 360bbb-3(b)(1), unless the authorization is terminated or revoked sooner.  Performed at [facility], [address] [place], [place] [address]   Blood culture (routine x 2)     Status: None (Preliminary result)   Collection Time: 07/27/22  3:43 PM   Specimen: BLOOD LEFT HAND  Result Value Ref Range Status   Specimen Description   Final    BLOOD LEFT HAND BOTTLES DRAWN AEROBIC AND ANAEROBIC   Special Requests Blood Culture adequate volume  Final   Culture   Final    NO GROWTH 3  DAYS Performed at [facility], [address] [place], [place] [address]    Report Status PENDING  Incomplete  Blood culture (routine x 2)     Status: None (Preliminary result)   Collection Time: 07/27/22  3:48 PM   Specimen: BLOOD  Result Value Ref Range Status   Specimen Description BLOOD  Final   Special Requests NONE  Final   Culture   Final    NO GROWTH 3 DAYS Performed at [facility], [address], [place], [place] [address]    Report Status PENDING  Incomplete  MRSA Next Gen by PCR, Nasal     Status: None   Collection Time: 07/27/22  8:44 PM   Specimen: Nasal Mucosa; Nasal Swab  Result Value Ref Range Status   MRSA by PCR Next Gen NOT DETECTED NOT DETECTED Final    Comment: (NOTE) The GeneXpert MRSA Assay (FDA approved for NASAL specimens only), is one component of a comprehensive MRSA colonization surveillance program. It is not intended to diagnose MRSA infection nor to guide or monitor treatment for MRSA infections. Test performance is not FDA approved in patients less than 33 years old. Performed at [facility], [address] [place], [place] [address]      Time coordinating discharge: 35 minutes  SIGNED:   Erick Blinks, DO [facility] 07/30/2022, 9:03 AM  If 7PM-7AM,  please contact night-coverage [email or web address]

## 2022-07-30 NOTE — Progress Notes (Addendum)
Patient discharged home today, transported home by family. Discharge paperwork went over with patient and family, both verbalized understanding. Belongings sent home with patient.  ?

## 2022-07-30 NOTE — TOC Transition Note (Signed)
Transition of Care [facility]) - CM/SW Discharge Note   Patient Details  Name: Aaron Leon MRN: [id number] Date of Birth: Aug 13, 1950  [facility] CM/SW Contact:  Villa Herb, LCSWA Phone Number: 07/30/2022, 10:27 AM  Clinical Narrative:    CSW noted that pt is active with [facility] services. Pt is medically ready for D/C. CSW confirmed with Morrie Sheldon with [facility] that pt will need HH PT/OT/RN resumption orders. CSW requested MD place orders. CSW updated Morrie Sheldon of plan for D/C. TOC signing off.   Final next level of care: Home w Home Health Services Barriers to Discharge: Barriers Resolved   Patient Goals and CMS Choice CMS Medicare.gov Compare Post Acute Care list provided to:: Patient Choice offered to / list presented to : Patient  Discharge Placement                         Discharge Plan and Services Additional resources added to the After Visit Summary for   In-house Referral: Clinical Social Work                DME Agency:  ([facility])       HH Arranged: Charity fundraiser, PT, OT HH Agency: [facility] ([facility]) Date HH Agency Contacted: 07/30/22   Representative spoke with at [facility] Agency: Morrie Sheldon  Social Determinants of Health (SDOH) Interventions SDOH Screenings   Food Insecurity: No Food Insecurity (07/27/2022)  Housing: Low Risk  (07/27/2022)  Transportation Needs: No Transportation Needs (07/27/2022)  Utilities: Not At Risk (07/27/2022)  Tobacco Use: Medium Risk (07/08/2022)     Readmission Risk Interventions    07/28/2022    5:22 PM  Readmission Risk Prevention Plan  Transportation Screening Complete  PCP or Specialist Appt within 3-5 Days Complete  HRI or Home Care Consult Complete  Social Work Consult for Recovery Care Planning/Counseling Complete  Palliative Care Screening Not Applicable  Medication Review Oceanographer) Complete

## 2022-07-31 LAB — CULTURE, BLOOD (ROUTINE X 2)

## 2022-08-01 LAB — CULTURE, BLOOD (ROUTINE X 2)
Culture: NO GROWTH
Special Requests: ADEQUATE

## 2022-08-25 ENCOUNTER — Encounter (HOSPITAL_COMMUNITY): Payer: Self-pay | Admitting: Emergency Medicine

## 2022-08-25 ENCOUNTER — Emergency Department (HOSPITAL_COMMUNITY)
Admission: EM | Admit: 2022-08-25 | Discharge: 2022-08-25 | Disposition: A | Discharge status: Home / Self Care | Payer: No Typology Code available for payment source | Attending: Emergency Medicine | Admitting: Emergency Medicine

## 2022-08-25 ENCOUNTER — Other Ambulatory Visit: Payer: Self-pay

## 2022-08-25 DIAGNOSIS — Z7901 Long term (current) use of anticoagulants: Secondary | ICD-10-CM | POA: Diagnosis not present

## 2022-08-25 DIAGNOSIS — J449 Chronic obstructive pulmonary disease, unspecified: Secondary | ICD-10-CM | POA: Insufficient documentation

## 2022-08-25 DIAGNOSIS — Z79899 Other long term (current) drug therapy: Secondary | ICD-10-CM | POA: Insufficient documentation

## 2022-08-25 DIAGNOSIS — I10 Essential (primary) hypertension: Secondary | ICD-10-CM | POA: Insufficient documentation

## 2022-08-25 DIAGNOSIS — N481 Balanitis: Secondary | ICD-10-CM | POA: Insufficient documentation

## 2022-08-25 DIAGNOSIS — N4889 Other specified disorders of penis: Secondary | ICD-10-CM | POA: Diagnosis present

## 2022-08-25 LAB — URINALYSIS, W/ REFLEX TO CULTURE (INFECTION SUSPECTED)
Bacteria, UA: NONE SEEN
Bilirubin Urine: NEGATIVE
Glucose, UA: NEGATIVE mg/dL
Hgb urine dipstick: NEGATIVE
Ketones, ur: 5 mg/dL — AB
Nitrite: NEGATIVE
Protein, ur: 100 mg/dL — AB
RBC / HPF: >50 RBC/hpf (ref 0–5)
Specific Gravity, Urine: 1.027 (ref 1.005–1.030)
WBC, UA: >50 WBC/hpf (ref 0–5)
pH: 5 (ref 5.0–8.0)

## 2022-08-25 MED ORDER — CLOTRIMAZOLE 1 % EX CREA: TOPICAL_CREAM | CUTANEOUS | 0 refills | Status: DC

## 2022-08-25 MED ORDER — FLUCONAZOLE 150 MG PO TABS
150.0000 mg | ORAL_TABLET | Freq: Once | ORAL | Status: AC
  Administered 2022-08-25: 150 mg via ORAL
  Filled 2022-08-25: qty 1

## 2022-08-25 NOTE — ED Provider Notes (Signed)
[place] EMERGENCY DEPARTMENT AT [facility]  Provider Note  CSN: [id number] Arrival date & time: 08/25/22 4098  History Chief Complaint  Patient presents with   Urinary Catheter Issue    Zak Hyke is a 72 y.o. male with history of COPD, HTN, CVA, PE on Eliquis and chronic indwelling foley brought to the ED by wife for evaluation of penile pain. Had his catheter changed at the [place] about 10 days ago, was having pain then but other than changing the catheter no further evaluation was done then. She has noted some sediment in his bag. He has not had any fever or vomiting. He indicates the glans and urethral meatus as the site of his pain. No abdominal or suprapubic pain.    Home Medications Prior to Admission medications   Medication Sig Start Date End Date Taking? Authorizing Provider  clotrimazole (LOTRIMIN) 1 % cream Apply to affected area 2 times daily 08/25/22  Yes Pollyann Savoy, MD  acetaminophen (TYLENOL) 500 MG tablet Take 500 mg by mouth in the morning and at bedtime.    [provider]  albuterol (PROVENTIL) (2.5 MG/3ML) 0.083% nebulizer solution Take 2.5 mg by nebulization every 6 (six) hours as needed for wheezing or shortness of breath.    [provider]  albuterol (VENTOLIN HFA) 108 (90 Base) MCG/ACT inhaler Inhale 2 puffs into the lungs every 6 (six) hours as needed for wheezing or shortness of breath.    [provider]  amLODipine (NORVASC) 5 MG tablet Take 5 mg by mouth daily.    [provider]  apixaban (ELIQUIS) 5 MG TABS tablet Take 2 tablets (10 mg total) by mouth 2 (two) times daily for 6 days. Patient not taking: Reported on 07/27/2022 06/19/22 06/25/22  Kendell Bane, MD  apixaban (ELIQUIS) 5 MG TABS tablet Take 1 tablet (5 mg total) by mouth 2 (two) times daily. 06/24/22   Shahmehdi, Gemma Payor, MD  ascorbic acid (VITAMIN C) 500 MG tablet Take 500 mg by mouth daily.    [provider]  aspirin 325 MG  tablet Take 325 mg by mouth daily.    [provider]  atorvastatin (LIPITOR) 40 MG tablet Take 1 tablet (40 mg total) by mouth at bedtime. 06/19/22 07/27/22  Shahmehdi, Gemma Payor, MD  baclofen (LIORESAL) 10 MG tablet Take 10-20 mg by mouth 2 (two) times daily.    [provider]  bisacodyl (DULCOLAX) 5 MG EC tablet Take 1 tablet (5 mg total) by mouth daily. 06/19/22   Shahmehdi, Gemma Payor, MD  budesonide-formoterol (SYMBICORT) 80-4.5 MCG/ACT inhaler Inhale 2 puffs into the lungs 2 (two) times daily.    [provider]  carbidopa-levodopa (SINEMET CR) 50-200 MG tablet Take 1 tablet by mouth at bedtime. 06/19/22 07/27/22  Shahmehdi, Gemma Payor, MD  carbidopa-levodopa (SINEMET IR) 25-100 MG tablet Take 2 tablets by mouth 3 (three) times daily. 02/20/21   [provider]  cetirizine (ZYRTEC) 10 MG tablet Take 10 mg by mouth in the morning.     [provider]  cholecalciferol (VITAMIN D3) 25 MCG (1000 UNIT) tablet Take 1,000 Units by mouth in the morning and at bedtime.    [provider]  ferrous sulfate 325 (65 FE) MG EC tablet Take 325 mg by mouth in the morning.     [provider]  furosemide (LASIX) 20 MG tablet Take 5 mg by mouth at bedtime.    [provider]  gabapentin (NEURONTIN) 300  MG capsule Take 2 capsules (600 mg total) by mouth 2 (two) times daily with breakfast and lunch. Patient not taking: Reported on 07/27/2022 06/19/22 07/27/22  Kendell Bane, MD  gabapentin (NEURONTIN) 300 MG capsule Take 3 capsules (900 mg total) by mouth at bedtime. Patient taking differently: Take 300 mg by mouth 3 (three) times daily. 600 mg in the mornring and 600 mg in the afternoon and 900 mg every evening 06/19/22 07/27/22  Kendell Bane, MD  galantamine (RAZADYNE ER) 24 MG 24 hr capsule Take 24 mg by mouth every evening. 02/20/21   [provider]  hyoscyamine (LEVSIN SL) 0.125 MG SL tablet Place 1 tablet (0.125 mg total) under the  tongue every 4 (four) hours as needed for cramping. Patient not taking: Reported on 07/27/2022 06/19/22   Kendell Bane, MD  insulin aspart (NOVOLOG) 100 UNIT/ML injection Inject 1-3 Units into the skin 3 (three) times daily before meals. Per sliding scale    [provider]  insulin detemir (LEVEMIR) 100 UNIT/ML injection Inject 6 Units into the skin every morning.    [provider]  magnesium oxide (MAG-OX) 400 (240 Mg) MG tablet Take 400-800 mg by mouth daily. 2 tablets in the morning and 1 tablet in the evening    [provider]  melatonin 3 MG TABS tablet Take 3 mg by mouth at bedtime.    [provider]  memantine (NAMENDA) 10 MG tablet Take 20 mg by mouth at bedtime.    [provider]  metFORMIN (GLUCOPHAGE) 1000 MG tablet Take 500 mg by mouth 2 (two) times daily with a meal.    [provider]  metoprolol tartrate (LOPRESSOR) 25 MG tablet Take 12.5 mg by mouth 2 (two) times daily.    [provider]  mirabegron ER (MYRBETRIQ) 25 MG TB24 tablet Take 1 tablet (25 mg total) by mouth daily. 06/19/22 08/18/22  ShahmehdiGemma Payor, MD  omeprazole (PRILOSEC) 20 MG capsule Take 20 mg by mouth in the morning.     [provider]  oxyCODONE-acetaminophen (PERCOCET) 10-325 MG tablet Take 1 tablet by mouth every 6 (six) hours as needed for pain.    [provider]  polyethylene glycol powder (GLYCOLAX/MIRALAX) 17 GM/SCOOP powder Take 17 g by mouth daily as needed for mild constipation or moderate constipation.    [provider]  sertraline (ZOLOFT) 100 MG tablet Take 100 mg by mouth in the morning.     [provider]  tamsulosin (FLOMAX) 0.4 MG CAPS capsule Take 0.4 mg by mouth at bedtime.    [provider]  tiotropium (SPIRIVA) 18 MCG inhalation capsule Place 18 mcg into inhaler and inhale in the morning.     [provider]  VITAMIN A PO Take by mouth.    [provider]   zolpidem (AMBIEN) 10 MG tablet Take 10 mg by mouth at bedtime.    [provider]     Allergies    Barbiturates, Seroquel  [quetiapine], and Shellfish allergy   Review of Systems   Review of Systems Please see HPI for pertinent positives and negatives  Physical Exam BP 101/62 (BP Location: Left Arm)   Pulse (!) 59   Temp 98 F (36.7 C) (Oral)   Resp 18   Ht 6' (1.829 m)   Wt 67 kg   SpO2 90%   BMI 20.03 kg/m   Physical Exam Vitals and nursing note reviewed. Exam conducted with a chaperone present.  HENT:  Head: Normocephalic.     Nose: Nose normal.  Eyes:     Extraocular Movements: Extraocular movements intact.  Pulmonary:     Effort: Pulmonary effort is normal.  Genitourinary:    Comments: Foley is in place, cloudy urine with sediment in tube. There is moderate erythema to the glans and foreskin consistent with a fungal infection/balanitis.  Musculoskeletal:        General: Normal range of motion.     Cervical back: Neck supple.  Skin:    Findings: No rash (on exposed skin).  Neurological:     Mental Status: He is alert and oriented to person, place, and time.  Psychiatric:        Mood and Affect: Mood normal.     ED Results / Procedures / Treatments   EKG None  Procedures Procedures  Medications Ordered in the ED Medications  fluconazole (DIFLUCAN) tablet 150 mg (150 mg Oral Given 08/25/22 9147)    Initial Impression and Plan  Patient here with penile pain at site of foley catheter insertion, has some balanitis on exam. Will exchange catheter and collect a clean urine specimen.   ED Course   Clinical Course as of 08/25/22 0707  Wynelle Link Aug 25, 2022  8295 Care of the patient signed out to Dr. Posey Rea at the change of shift pending UA.  [CS]  0700 Pending UA [MK]    Clinical Course User Index [CS] Pollyann Savoy, MD [MK] Kommor, Wyn Forster, MD     MDM Rules/Calculators/A&P Medical Decision Making Problems Addressed: Balanitis:  acute illness or injury  Risk Prescription drug management.     Final Clinical Impression(s) / ED Diagnoses Final diagnoses:  Balanitis    Rx / DC Orders ED Discharge Orders          Ordered    clotrimazole (LOTRIMIN) 1 % cream        08/25/22 0625             Pollyann Savoy, MD 08/25/22 [phone]

## 2022-08-25 NOTE — ED Triage Notes (Signed)
Pt with c/o pain from urinary catheter. Wife states that she thinks catheter "might have been pulled some during his recent OT visit".

## 2022-08-27 LAB — URINE CULTURE: Culture: >=100000 — AB

## 2022-08-28 LAB — URINE CULTURE

## 2022-08-29 ENCOUNTER — Telehealth (HOSPITAL_BASED_OUTPATIENT_CLINIC_OR_DEPARTMENT_OTHER): Payer: Self-pay | Admitting: *Deleted

## 2022-08-29 NOTE — Telephone Encounter (Signed)
Post ED Visit - Positive Culture Follow-up  Culture report reviewed by antimicrobial stewardship pharmacist: [facility] [x]  Caitlin edwards  Pharm.D. []  Celedonio Miyamoto, Pharm.D., BCPS AQ-ID []  Garvin Fila, Pharm.D., BCPS []  Georgina Pillion, Pharm.D., BCPS []  [place], [address].D., BCPS, AAHIVP []  Estella Husk, Pharm.D., BCPS, AAHIVP []  Lysle Pearl, PharmD, BCPS []  Phillips Climes, PharmD, BCPS []  Agapito Games, PharmD, BCPS []  Verlan Friends, PharmD []  Mervyn Gay, PharmD, BCPS []  Vinnie Level, PharmD  [facility] []  Len Childs, PharmD []  Greer Pickerel, PharmD []  Adalberto Cole, PharmD []  Perlie Gold, Rph []  Lonell Face) Jean Rosenthal, PharmD []  Earl Many, PharmD []  Junita Push, PharmD []  Dorna Leitz, PharmD []  Terrilee Files, PharmD []  Lynann Beaver, PharmD []  Keturah Barre, PharmD []  Loralee Pacas, PharmD []  Bernadene Person, PharmD   Positive Urine  culture Treated with No abx, organism sensitive to the same and no further patient follow-up is required at this time.  Nena Polio Garner Nash 08/29/2022, 10:36 AM

## 2022-09-09 ENCOUNTER — Telehealth (HOSPITAL_COMMUNITY): Payer: Self-pay | Admitting: *Deleted

## 2022-09-09 NOTE — Telephone Encounter (Signed)
Attempted to contact patient to schedule OP MBS. Left VM. RKEEL 

## 2022-09-10 ENCOUNTER — Other Ambulatory Visit (HOSPITAL_COMMUNITY): Payer: Self-pay | Admitting: *Deleted

## 2022-09-10 DIAGNOSIS — R1310 Dysphagia, unspecified: Secondary | ICD-10-CM

## 2022-09-10 DIAGNOSIS — R059 Cough, unspecified: Secondary | ICD-10-CM

## 2022-09-24 ENCOUNTER — Ambulatory Visit (HOSPITAL_COMMUNITY)
Admission: RE | Admit: 2022-09-24 | Discharge: 2022-09-24 | Disposition: A | Discharge status: Home / Self Care | Payer: No Typology Code available for payment source | Source: Ambulatory Visit | Attending: Physician Assistant | Admitting: Physician Assistant

## 2022-09-24 ENCOUNTER — Ambulatory Visit (HOSPITAL_COMMUNITY)
Admission: RE | Admit: 2022-09-24 | Discharge: 2022-09-24 | Disposition: A | Discharge status: Home / Self Care | Payer: No Typology Code available for payment source | Source: Ambulatory Visit

## 2022-09-24 DIAGNOSIS — R1314 Dysphagia, pharyngoesophageal phase: Secondary | ICD-10-CM | POA: Diagnosis not present

## 2022-09-24 DIAGNOSIS — I10 Essential (primary) hypertension: Secondary | ICD-10-CM | POA: Insufficient documentation

## 2022-09-24 DIAGNOSIS — I69391 Dysphagia following cerebral infarction: Secondary | ICD-10-CM | POA: Diagnosis not present

## 2022-09-24 DIAGNOSIS — R1312 Dysphagia, oropharyngeal phase: Secondary | ICD-10-CM | POA: Insufficient documentation

## 2022-09-24 DIAGNOSIS — R1310 Dysphagia, unspecified: Secondary | ICD-10-CM

## 2022-09-24 DIAGNOSIS — R059 Cough, unspecified: Secondary | ICD-10-CM | POA: Diagnosis present

## 2022-09-24 NOTE — Therapy (Signed)
Modified Barium Swallow Study  Patient Details  Name: Aaron Leon MRN: [id number] Date of Birth: Nov 21, 1950  Today's Date: 09/24/2022  Modified Barium Swallow completed.  Full report located under Chart Review in the Imaging Section.  History of Present Illness Aaron Leon is a 72 y.o. male with PMH: COPD, HTN, CVA, PE on Eliquis, chronic indwelling foley, dysphagia, who presented to this OP MBS due to c/o increased difficulty swallowing which patient and spouse believe started about two years ago and seems to be getting worse. Wife reported that he eats soft foods mainly and Mr. Wennberg reported he has recently started taking his pills with applesauce. He had an MBS in 2021 at a different hospital which did not show any penetration or aspiration but did show moderate amount of pharyngeal residuals with liquids and solid PO's.   Clinical Impression Patient presents with a mild oropharyngeal dysphagia characterized by decreased strength and efficiency with mastication of solids and slightly slow anterior to posterior transit in oral cavity. During pharyngeal phase, anterior hyoid excursion, laryngeal elevation and epiglottic inversion were all complete. Swallow initiation occured at level of vallecular sinus with solids and thick liquids and occured at level of pyriform sinus with thin liquids. No penetration or aspiration observed with any of the tested consistencies, even when drinking larger cup sips and straw sips of thin liquids. 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 This, in addition to posterior and anterior cervical fusion hardware, appears to result in slow but mostly complete transit of boluses through upper esoophagus. Trace amount of vallecular, pyriform and esophageal residuals remain after initial swallows. Radiology PA present who reported that 13mm barium tablet become stuck in distal esophagus just above GE junction. SLP is not recommnending any changes to patient's  PO consistency as he is already eating softer foods. Recommendation is for small bites of solids, not eating too quickly and avoiding foods that are difficult to masticate.   Factors that may increase risk of adverse event in presence of aspiration Aaron Leon & Clearance Aaron Leon 2021): Limited mobility  Swallow Evaluation Recommendations Recommendations: PO diet PO Diet Recommendation: Dysphagia 3 (Mechanical soft);Thin liquids (Level 0) Liquid Administration via: Cup;Straw Medication Administration: Whole meds with puree     Aaron Nevin, MA, CCC-SLP Speech Therapy

## 2023-01-07 ENCOUNTER — Inpatient Hospital Stay (HOSPITAL_COMMUNITY)
Admission: EM | Admit: 2023-01-07 | Discharge: 2023-01-10 | DRG: 871 | Disposition: A | Discharge status: Home Health | Payer: No Typology Code available for payment source | Attending: Internal Medicine | Admitting: Internal Medicine

## 2023-01-07 ENCOUNTER — Emergency Department (HOSPITAL_COMMUNITY): Payer: No Typology Code available for payment source

## 2023-01-07 ENCOUNTER — Encounter (HOSPITAL_COMMUNITY): Payer: Self-pay

## 2023-01-07 ENCOUNTER — Other Ambulatory Visit: Payer: Self-pay

## 2023-01-07 DIAGNOSIS — Z7901 Long term (current) use of anticoagulants: Secondary | ICD-10-CM

## 2023-01-07 DIAGNOSIS — Z9981 Dependence on supplemental oxygen: Secondary | ICD-10-CM

## 2023-01-07 DIAGNOSIS — J1282 Pneumonia due to coronavirus disease 2019: Secondary | ICD-10-CM | POA: Diagnosis present

## 2023-01-07 DIAGNOSIS — E860 Dehydration: Secondary | ICD-10-CM | POA: Diagnosis present

## 2023-01-07 DIAGNOSIS — E43 Unspecified severe protein-calorie malnutrition: Secondary | ICD-10-CM | POA: Diagnosis present

## 2023-01-07 DIAGNOSIS — F03918 Unspecified dementia, unspecified severity, with other behavioral disturbance: Secondary | ICD-10-CM | POA: Diagnosis not present

## 2023-01-07 DIAGNOSIS — Z86711 Personal history of pulmonary embolism: Secondary | ICD-10-CM | POA: Diagnosis not present

## 2023-01-07 DIAGNOSIS — A419 Sepsis, unspecified organism: Secondary | ICD-10-CM | POA: Diagnosis not present

## 2023-01-07 DIAGNOSIS — R6520 Severe sepsis without septic shock: Secondary | ICD-10-CM | POA: Diagnosis present

## 2023-01-07 DIAGNOSIS — Z79899 Other long term (current) drug therapy: Secondary | ICD-10-CM

## 2023-01-07 DIAGNOSIS — A4189 Other specified sepsis: Principal | ICD-10-CM | POA: Diagnosis present

## 2023-01-07 DIAGNOSIS — U071 COVID-19: Secondary | ICD-10-CM | POA: Diagnosis present

## 2023-01-07 DIAGNOSIS — I2699 Other pulmonary embolism without acute cor pulmonale: Secondary | ICD-10-CM | POA: Diagnosis present

## 2023-01-07 DIAGNOSIS — G9341 Metabolic encephalopathy: Secondary | ICD-10-CM | POA: Diagnosis present

## 2023-01-07 DIAGNOSIS — Z7984 Long term (current) use of oral hypoglycemic drugs: Secondary | ICD-10-CM

## 2023-01-07 DIAGNOSIS — Z96641 Presence of right artificial hip joint: Secondary | ICD-10-CM | POA: Diagnosis present

## 2023-01-07 DIAGNOSIS — J189 Pneumonia, unspecified organism: Secondary | ICD-10-CM

## 2023-01-07 DIAGNOSIS — Z794 Long term (current) use of insulin: Secondary | ICD-10-CM

## 2023-01-07 DIAGNOSIS — G20A1 Parkinson's disease without dyskinesia, without mention of fluctuations: Secondary | ICD-10-CM | POA: Diagnosis present

## 2023-01-07 DIAGNOSIS — E119 Type 2 diabetes mellitus without complications: Secondary | ICD-10-CM | POA: Diagnosis present

## 2023-01-07 DIAGNOSIS — E876 Hypokalemia: Secondary | ICD-10-CM | POA: Diagnosis not present

## 2023-01-07 DIAGNOSIS — J440 Chronic obstructive pulmonary disease with acute lower respiratory infection: Secondary | ICD-10-CM | POA: Diagnosis present

## 2023-01-07 DIAGNOSIS — I10 Essential (primary) hypertension: Secondary | ICD-10-CM | POA: Diagnosis present

## 2023-01-07 DIAGNOSIS — Z8673 Personal history of transient ischemic attack (TIA), and cerebral infarction without residual deficits: Secondary | ICD-10-CM

## 2023-01-07 DIAGNOSIS — Z87891 Personal history of nicotine dependence: Secondary | ICD-10-CM

## 2023-01-07 DIAGNOSIS — E872 Acidosis, unspecified: Secondary | ICD-10-CM | POA: Diagnosis present

## 2023-01-07 DIAGNOSIS — J9621 Acute and chronic respiratory failure with hypoxia: Secondary | ICD-10-CM | POA: Diagnosis present

## 2023-01-07 DIAGNOSIS — Z7951 Long term (current) use of inhaled steroids: Secondary | ICD-10-CM

## 2023-01-07 DIAGNOSIS — Z7982 Long term (current) use of aspirin: Secondary | ICD-10-CM

## 2023-01-07 DIAGNOSIS — E1169 Type 2 diabetes mellitus with other specified complication: Secondary | ICD-10-CM

## 2023-01-07 DIAGNOSIS — Z888 Allergy status to other drugs, medicaments and biological substances status: Secondary | ICD-10-CM

## 2023-01-07 DIAGNOSIS — F02818 Dementia in other diseases classified elsewhere, unspecified severity, with other behavioral disturbance: Secondary | ICD-10-CM | POA: Diagnosis present

## 2023-01-07 DIAGNOSIS — Z91013 Allergy to seafood: Secondary | ICD-10-CM

## 2023-01-07 DIAGNOSIS — Z681 Body mass index (BMI) 19 or less, adult: Secondary | ICD-10-CM

## 2023-01-07 DIAGNOSIS — Z993 Dependence on wheelchair: Secondary | ICD-10-CM

## 2023-01-07 LAB — CBC WITH DIFFERENTIAL/PLATELET
Abs Immature Granulocytes: 0.03 10*3/uL (ref 0.00–0.07)
Basophils Absolute: 0 10*3/uL (ref 0.0–0.1)
Basophils Relative: 0 %
Eosinophils Absolute: 0 10*3/uL (ref 0.0–0.5)
Eosinophils Relative: 0 %
HCT: 47.5 % (ref 39.0–52.0)
Hemoglobin: 14.2 g/dL (ref 13.0–17.0)
Immature Granulocytes: 0 %
Lymphocytes Relative: 5 %
Lymphs Abs: 0.6 10*3/uL — ABNORMAL LOW (ref 0.7–4.0)
MCH: 26.4 pg (ref 26.0–34.0)
MCHC: 29.9 g/dL — ABNORMAL LOW (ref 30.0–36.0)
MCV: 88.3 fL (ref 80.0–100.0)
Monocytes Absolute: 0.6 10*3/uL (ref 0.1–1.0)
Monocytes Relative: 5 %
Neutro Abs: 10.4 10*3/uL — ABNORMAL HIGH (ref 1.7–7.7)
Neutrophils Relative %: 90 %
Platelets: 345 10*3/uL (ref 150–400)
RBC: 5.38 MIL/uL (ref 4.22–5.81)
RDW: 17 % — ABNORMAL HIGH (ref 11.5–15.5)
WBC: 11.6 10*3/uL — ABNORMAL HIGH (ref 4.0–10.5)
nRBC: 0 % (ref 0.0–0.2)

## 2023-01-07 LAB — URINALYSIS, W/ REFLEX TO CULTURE (INFECTION SUSPECTED)
Bilirubin Urine: NEGATIVE
Glucose, UA: 150 mg/dL — AB
Ketones, ur: 20 mg/dL — AB
Leukocytes,Ua: NEGATIVE
Nitrite: NEGATIVE
Protein, ur: >=300 mg/dL — AB
Specific Gravity, Urine: 1.025 (ref 1.005–1.030)
pH: 5 (ref 5.0–8.0)

## 2023-01-07 LAB — MRSA NEXT GEN BY PCR, NASAL: MRSA by PCR Next Gen: NOT DETECTED

## 2023-01-07 LAB — BLOOD GAS, ARTERIAL
Acid-Base Excess: 2 mmol/L (ref 0.0–2.0)
Bicarbonate: 25.5 mmol/L (ref 20.0–28.0)
Drawn by: 27407
FIO2: 68 %
O2 Saturation: 92.2 %
Patient temperature: 37.7
pCO2 arterial: 36 mm[Hg] (ref 32–48)
pH, Arterial: 7.46 — ABNORMAL HIGH (ref 7.35–7.45)
pO2, Arterial: 60 mm[Hg] — ABNORMAL LOW (ref 83–108)

## 2023-01-07 LAB — COMPREHENSIVE METABOLIC PANEL
ALT: 13 U/L (ref 0–44)
AST: 14 U/L — ABNORMAL LOW (ref 15–41)
Albumin: 3.2 g/dL — ABNORMAL LOW (ref 3.5–5.0)
Alkaline Phosphatase: 93 U/L (ref 38–126)
Anion gap: 18 — ABNORMAL HIGH (ref 5–15)
BUN: 32 mg/dL — ABNORMAL HIGH (ref 8–23)
CO2: 22 mmol/L (ref 22–32)
Calcium: 9 mg/dL (ref 8.9–10.3)
Chloride: 105 mmol/L (ref 98–111)
Creatinine, Ser: 1.01 mg/dL (ref 0.61–1.24)
GFR, Estimated: >60 mL/min (ref >60)
Glucose, Bld: 294 mg/dL — ABNORMAL HIGH (ref 70–99)
Potassium: 3.7 mmol/L (ref 3.5–5.1)
Sodium: 145 mmol/L (ref 135–145)
Total Bilirubin: 1 mg/dL (ref 0.3–1.2)
Total Protein: 7.6 g/dL (ref 6.5–8.1)

## 2023-01-07 LAB — LACTATE DEHYDROGENASE: LDH: 156 U/L (ref 98–192)

## 2023-01-07 LAB — LACTIC ACID, PLASMA
Lactic Acid, Venous: 2.1 mmol/L (ref 0.5–1.9)
Lactic Acid, Venous: 1.7 mmol/L (ref 0.5–1.9)

## 2023-01-07 LAB — PROTIME-INR
INR: 1.3 — ABNORMAL HIGH (ref 0.8–1.2)
Prothrombin Time: 16 s — ABNORMAL HIGH (ref 11.4–15.2)

## 2023-01-07 LAB — CBG MONITORING, ED: Glucose-Capillary: 193 mg/dL — ABNORMAL HIGH (ref 70–99)

## 2023-01-07 LAB — D-DIMER, QUANTITATIVE: D-Dimer, Quant: 1.33 ug{FEU}/mL — ABNORMAL HIGH (ref 0.00–0.50)

## 2023-01-07 LAB — APTT: aPTT: 36 s (ref 24–36)

## 2023-01-07 LAB — RESP PANEL BY RT-PCR (RSV, FLU A&B, COVID)  RVPGX2
Influenza A by PCR: NEGATIVE
Influenza B by PCR: NEGATIVE
Resp Syncytial Virus by PCR: NEGATIVE
SARS Coronavirus 2 by RT PCR: POSITIVE — AB

## 2023-01-07 LAB — PROCALCITONIN: Procalcitonin: 0.59 ng/mL

## 2023-01-07 LAB — FERRITIN: Ferritin: 346 ng/mL — ABNORMAL HIGH (ref 24–336)

## 2023-01-07 MED ORDER — SODIUM CHLORIDE 0.9 % IV BOLUS (SEPSIS)
1000.0000 mL | Freq: Once | INTRAVENOUS | Status: AC
  Administered 2023-01-07: 1000 mL via INTRAVENOUS

## 2023-01-07 MED ORDER — INSULIN ASPART 100 UNIT/ML IJ SOLN
0.0000 [IU] | INTRAMUSCULAR | Status: DC
  Administered 2023-01-07: 3 [IU] via SUBCUTANEOUS
  Administered 2023-01-08 (×2): 2 [IU] via SUBCUTANEOUS
  Administered 2023-01-08 (×3): 3 [IU] via SUBCUTANEOUS
  Administered 2023-01-09: 2 [IU] via SUBCUTANEOUS
  Administered 2023-01-09 (×2): 3 [IU] via SUBCUTANEOUS
  Administered 2023-01-09: 5 [IU] via SUBCUTANEOUS
  Administered 2023-01-09 (×2): 2 [IU] via SUBCUTANEOUS
  Administered 2023-01-10: 3 [IU] via SUBCUTANEOUS
  Administered 2023-01-10: 2 [IU] via SUBCUTANEOUS
  Administered 2023-01-10: 8 [IU] via SUBCUTANEOUS
  Administered 2023-01-10: 2 [IU] via SUBCUTANEOUS
  Filled 2023-01-07 (×5): qty 1

## 2023-01-07 MED ORDER — SODIUM CHLORIDE 0.9 % IV SOLN
500.0000 mg | INTRAVENOUS | Status: DC
  Administered 2023-01-07 – 2023-01-09 (×3): 500 mg via INTRAVENOUS
  Filled 2023-01-07 (×3): qty 5

## 2023-01-07 MED ORDER — METHYLPREDNISOLONE SODIUM SUCC 125 MG IJ SOLR
80.0000 mg | Freq: Once | INTRAMUSCULAR | Status: AC
  Administered 2023-01-07: 80 mg via INTRAVENOUS
  Filled 2023-01-07: qty 2

## 2023-01-07 MED ORDER — ACETAMINOPHEN 325 MG PO TABS
650.0000 mg | ORAL_TABLET | Freq: Once | ORAL | Status: AC
  Administered 2023-01-07: 650 mg via ORAL
  Filled 2023-01-07: qty 2

## 2023-01-07 MED ORDER — LACTATED RINGERS IV SOLN: INTRAVENOUS | Status: DC

## 2023-01-07 MED ORDER — SODIUM CHLORIDE 0.9 % IV SOLN
2.0000 g | INTRAVENOUS | Status: DC
  Administered 2023-01-07 – 2023-01-09 (×3): 2 g via INTRAVENOUS
  Filled 2023-01-07 (×3): qty 20

## 2023-01-07 MED ORDER — POTASSIUM CHLORIDE IN NACL 20-0.45 MEQ/L-% IV SOLN
INTRAVENOUS | Status: AC
  Filled 2023-01-07 (×2): qty 1000

## 2023-01-07 MED ORDER — BARICITINIB 2 MG PO TABS
4.0000 mg | ORAL_TABLET | Freq: Every day | ORAL | Status: DC
  Administered 2023-01-07 – 2023-01-10 (×4): 4 mg via ORAL
  Filled 2023-01-07 (×10): qty 2

## 2023-01-07 NOTE — ED Provider Notes (Signed)
MSE note.  Patient with history of COPD.  He had a recent fall couple days ago and has been weak.  He is lethargic and febrile and hypoxic.  Sepsis protocol has been started.  He is getting fluid boluses along with antibiotics to cover pneumonia.  Lungs have minimal wheezing throughout.  Patient is tachycardic but not hypotensive   Bethann Berkshire, MD 01/09/23 1516

## 2023-01-07 NOTE — Assessment & Plan Note (Signed)
Baseline dementia, and Parkinson's disease. -Resume Sinemet

## 2023-01-07 NOTE — Assessment & Plan Note (Signed)
Stable. -Resume metoprolol 12.5, lisinopril 2.5

## 2023-01-07 NOTE — ED Provider Notes (Signed)
[place] [facility] AT [facility] Provider Note   CSN: [id number] Arrival date & time: 01/07/23  1406     History  Chief Complaint  Patient presents with   Code Sepsis    Aaron Leon is a 72 y.o. male.  HPI 72 year old male with a history of dementia, diabetes, COPD on 3 L of home oxygen, previous PE on apixaban, presents with altered mental status.  History is primarily from daughter at the bedside.  Patient fell on 10/11 and has a bruise near his right temple.  He is also been complaining of left-sided rib pain since the fall.  He had a fever 2 days later 1 time at 100.8 but no fever since.  He was doing okay the day after the fall but over the last few days has been weaker, not speaking, and staying in bed which is atypical.  He does have difficulty moving at baseline due to Parkinson's.  The patient does not contribute much to the history.  No increasing cough.  Home Medications Prior to Admission medications   Medication Sig Start Date End Date Taking? Authorizing Provider  acetaminophen (TYLENOL) 500 MG tablet Take 500 mg by mouth every 6 (six) hours as needed for moderate pain (pain score 4-6).   Yes [provider]  albuterol (PROVENTIL) (2.5 MG/3ML) 0.083% nebulizer solution Take 2.5 mg by nebulization every 6 (six) hours as needed for wheezing or shortness of breath.   Yes [provider]  albuterol (VENTOLIN HFA) 108 (90 Base) MCG/ACT inhaler Inhale 2 puffs into the lungs every 6 (six) hours as needed for wheezing or shortness of breath.   Yes [provider]  apixaban (ELIQUIS) 5 MG TABS tablet Take 1 tablet (5 mg total) by mouth 2 (two) times daily. 06/24/22  Yes Shahmehdi, Seyed A, MD  ascorbic acid (VITAMIN C) 500 MG tablet Take 500 mg by mouth daily.   Yes [provider]  aspirin 325 MG tablet Take 325 mg by mouth daily.   Yes [provider]  atorvastatin (LIPITOR) 40 MG tablet Take 1 tablet (40 mg  total) by mouth at bedtime. 06/19/22 01/07/23 Yes Shahmehdi, Gemma Payor, MD  baclofen (LIORESAL) 10 MG tablet Take 10-20 mg by mouth daily.   Yes [provider]  bisacodyl (DULCOLAX) 5 MG EC tablet Take 1 tablet (5 mg total) by mouth daily. Patient taking differently: Take 5 mg by mouth daily as needed for moderate constipation. 06/19/22  Yes Shahmehdi, Seyed A, MD  budesonide-formoterol (SYMBICORT) 80-4.5 MCG/ACT inhaler Inhale 2 puffs into the lungs 2 (two) times daily.   Yes [provider]  carbidopa-levodopa (SINEMET CR) 50-200 MG tablet Take 1 tablet by mouth at bedtime. 06/19/22 01/07/23 Yes Shahmehdi, Seyed A, MD  carbidopa-levodopa (SINEMET IR) 25-100 MG tablet Take 2 tablets by mouth 3 (three) times daily. 02/20/21  Yes [provider]  cetirizine (ZYRTEC) 10 MG tablet Take 10 mg by mouth in the morning.    Yes [provider]  cholecalciferol (VITAMIN D3) 25 MCG (1000 UNIT) tablet Take 1,000 Units by mouth in the morning and at bedtime.   Yes [provider]  clotrimazole (LOTRIMIN) 1 % cream Apply to affected area 2 times daily 08/25/22  Yes Pollyann Savoy, MD  ferrous sulfate 325 (65 FE) MG EC tablet Take 325 mg by mouth in the morning.    Yes [provider]  furosemide (LASIX) 20 MG tablet Take 5 mg by mouth at bedtime.  Yes [provider]  gabapentin (NEURONTIN) 300 MG capsule Take 2 capsules (600 mg total) by mouth 2 (two) times daily with breakfast and lunch. 06/19/22 01/07/23 Yes Shahmehdi, Seyed A, MD  galantamine (RAZADYNE ER) 24 MG 24 hr capsule Take 24 mg by mouth every evening. 02/20/21  Yes [provider]  insulin aspart (NOVOLOG) 100 UNIT/ML injection Inject 1-3 Units into the skin 3 (three) times daily before meals. Per sliding scale   Yes [provider]  insulin detemir (LEVEMIR) 100 UNIT/ML injection Inject 6 Units into the skin every morning.   Yes [provider]  lisinopril  (ZESTRIL) 2.5 MG tablet Take 2.5 mg by mouth daily.   Yes [provider]  magnesium oxide (MAG-OX) 400 (240 Mg) MG tablet Take 400-800 mg by mouth daily. 2 tablets in the morning and 1 tablet in the evening   Yes [provider]  melatonin 3 MG TABS tablet Take 3 mg by mouth at bedtime.   Yes [provider]  memantine (NAMENDA) 10 MG tablet Take 20 mg by mouth at bedtime.   Yes [provider]  metFORMIN (GLUCOPHAGE) 1000 MG tablet Take 500 mg by mouth 2 (two) times daily with a meal.   Yes [provider]  metoprolol tartrate (LOPRESSOR) 25 MG tablet Take 12.5 mg by mouth 2 (two) times daily.   Yes [provider]  omeprazole (PRILOSEC) 20 MG capsule Take 20 mg by mouth in the morning.    Yes [provider]  oxybutynin (DITROPAN-XL) 10 MG 24 hr tablet Take 10 mg by mouth at bedtime.   Yes [provider]  oxyCODONE-acetaminophen (PERCOCET) 10-325 MG tablet Take 1 tablet by mouth every 6 (six) hours as needed for pain.   Yes [provider]  sertraline (ZOLOFT) 100 MG tablet Take 100 mg by mouth in the morning.    Yes [provider]  tamsulosin (FLOMAX) 0.4 MG CAPS capsule Take 0.4 mg by mouth at bedtime.   Yes [provider]  VITAMIN A PO Take by mouth.   Yes [provider]  zolpidem (AMBIEN) 10 MG tablet Take 10 mg by mouth at bedtime.   Yes [provider]      Allergies    Barbiturates, Seroquel  [quetiapine], and Shellfish allergy    Review of Systems   Review of Systems  Unable to perform ROS: Mental status change    Physical Exam Updated Vital Signs BP (!) 145/72   Pulse 71   Temp 99.9 F (37.7 C) (Oral)   Resp 20   Ht 6' (1.829 m)   Wt 65.8 kg   SpO2 92%   BMI 19.67 kg/m  Physical Exam Vitals and nursing note reviewed.  Constitutional:      Appearance: He is well-developed.  HENT:     Head: Normocephalic. Contusion present.   Eyes:     Pupils:  Pupils are equal, round, and reactive to light.  Cardiovascular:     Rate and Rhythm: Regular rhythm. Tachycardia present.     Heart sounds: Normal heart sounds.  Pulmonary:     Effort: Pulmonary effort is normal.     Breath sounds: Rhonchi present. No rales.  Abdominal:     General: There is no distension.     Palpations: Abdomen is soft.     Tenderness: There is no abdominal tenderness.  Musculoskeletal:     Cervical back: No tenderness.     Comments: There is no obvious hip deformity  but the patient seems to grunt a little and pain when I try to range his hips bilaterally. No obvious back tenderness on exam.  No chest wall tenderness.  Skin:    General: Skin is warm and dry.  Neurological:     Mental Status: He is alert.     Comments: Awake, alert, equal grip strength in hands. Doesn't move legs to command, but daughter notes this isn't too abnormal.     ED Results / Procedures / Treatments   Labs (all labs ordered are listed, but only abnormal results are displayed) Labs Reviewed  RESP PANEL BY RT-PCR (RSV, FLU A&B, COVID)  [id number] - Abnormal; Notable for the following components:      Result Value   SARS Coronavirus 2 by RT PCR POSITIVE (*)    All other components within normal limits  COMPREHENSIVE METABOLIC PANEL - Abnormal; Notable for the following components:   Glucose, Bld 294 (*)    BUN 32 (*)    Albumin 3.2 (*)    AST 14 (*)    Anion gap 18 (*)    All other components within normal limits  LACTIC ACID, PLASMA - Abnormal; Notable for the following components:   Lactic Acid, Venous 2.1 (*)    All other components within normal limits  CBC WITH DIFFERENTIAL/PLATELET - Abnormal; Notable for the following components:   WBC 11.6 (*)    MCHC 29.9 (*)    RDW 17.0 (*)    Neutro Abs 10.4 (*)    Lymphs Abs 0.6 (*)    All other components within normal limits  PROTIME-INR - Abnormal; Notable for the following components:   Prothrombin Time 16.0 (*)    INR 1.3 (*)     All other components within normal limits  URINALYSIS, W/ REFLEX TO CULTURE (INFECTION SUSPECTED) - Abnormal; Notable for the following components:   APPearance HAZY (*)    Glucose, UA 150 (*)    Hgb urine dipstick SMALL (*)    Ketones, ur 20 (*)    Protein, ur >=300 (*)    Bacteria, UA RARE (*)    Non Squamous Epithelial 0-5 (*)    All other components within normal limits  CULTURE, BLOOD (ROUTINE X 2)  CULTURE, BLOOD (ROUTINE X 2)  LACTIC ACID, PLASMA  APTT  PROCALCITONIN  BLOOD GAS, ARTERIAL  D-DIMER, QUANTITATIVE  FERRITIN  C-REACTIVE PROTEIN  LACTATE DEHYDROGENASE  C-REACTIVE PROTEIN  FERRITIN  LACTATE DEHYDROGENASE  D-DIMER, QUANTITATIVE    EKG EKG Interpretation Date/Time:  Tuesday January 07 2023 14:13:53 EDT Ventricular Rate:  133 PR Interval:  136 QRS Duration:  82 QT Interval:  308 QTC Calculation: 458 R Axis:   -86  Text Interpretation: Sinus tachycardia with occasional Premature ventricular complexes Left axis deviation Pulmonary disease pattern ST & T wave abnormality, consider anterior ischemia Confirmed by Pricilla Loveless [phone]) on 01/07/2023 2:58:20 PM  Radiology CT Head Wo Contrast  Result Date: 01/07/2023 CLINICAL DATA:  Head trauma, minor (Age >= 65y); Neck trauma (Age >= 65y) EXAM: CT HEAD WITHOUT CONTRAST CT CERVICAL SPINE WITHOUT CONTRAST TECHNIQUE: Multidetector CT imaging of the head and cervical spine was performed following the standard protocol without intravenous contrast. Multiplanar CT image reconstructions of the cervical spine were also generated. RADIATION DOSE REDUCTION: This exam was performed according to the departmental dose-optimization program which includes automated exposure control, adjustment of the mA and/or kV according to patient size and/or use of iterative reconstruction technique. COMPARISON:  None Available. FINDINGS: CT HEAD  FINDINGS Brain: No evidence of acute infarction, hemorrhage, hydrocephalus, extra-axial collection  or mass lesion/mass effect. Vascular: Calcific atherosclerosis. Skull: No acute fracture. Sinuses/Orbits: Sinuses.  No acute orbital findings. Other: No mastoid effusions CT CERVICAL SPINE FINDINGS Alignment: No substantial sagittal subluxation. Skull base and vertebrae: No acute fracture. Similar chronic height loss at C7. Vertebral heights are maintained. Osteopenia. C5-T1 ACDF. Soft tissues and spinal canal: No prevertebral fluid or swelling. No visible canal hematoma. Disc levels: Multilevel bony degenerative change including facet and uncovertebral hypertrophy and disc height loss. Upper chest: Emphysema. IMPRESSION: No evidence of acute abnormality intracranially or in the cervical spine. Electronically Signed   By: Feliberto Harts M.D.   On: 01/07/2023 18:19   CT Cervical Spine Wo Contrast  Result Date: 01/07/2023 CLINICAL DATA:  Head trauma, minor (Age >= 65y); Neck trauma (Age >= 65y) EXAM: CT HEAD WITHOUT CONTRAST CT CERVICAL SPINE WITHOUT CONTRAST TECHNIQUE: Multidetector CT imaging of the head and cervical spine was performed following the standard protocol without intravenous contrast. Multiplanar CT image reconstructions of the cervical spine were also generated. RADIATION DOSE REDUCTION: This exam was performed according to the departmental dose-optimization program which includes automated exposure control, adjustment of the mA and/or kV according to patient size and/or use of iterative reconstruction technique. COMPARISON:  None Available. FINDINGS: CT HEAD FINDINGS Brain: No evidence of acute infarction, hemorrhage, hydrocephalus, extra-axial collection or mass lesion/mass effect. Vascular: Calcific atherosclerosis. Skull: No acute fracture. Sinuses/Orbits: Sinuses.  No acute orbital findings. Other: No mastoid effusions CT CERVICAL SPINE FINDINGS Alignment: No substantial sagittal subluxation. Skull base and vertebrae: No acute fracture. Similar chronic height loss at C7. Vertebral heights  are maintained. Osteopenia. C5-T1 ACDF. Soft tissues and spinal canal: No prevertebral fluid or swelling. No visible canal hematoma. Disc levels: Multilevel bony degenerative change including facet and uncovertebral hypertrophy and disc height loss. Upper chest: Emphysema. IMPRESSION: No evidence of acute abnormality intracranially or in the cervical spine. Electronically Signed   By: Feliberto Harts M.D.   On: 01/07/2023 18:19   DG Pelvis 1-2 Views  Result Date: 01/07/2023 CLINICAL DATA:  Larey Seat several days ago. EXAM: PELVIS - 1-2 VIEW COMPARISON:  CT 06/17/2022 FINDINGS: The patient is rotated towards the right. The bones are osteopenic, limiting sensitivity. I do not see evidence of regional fracture. Previous hip arthroplasty on the right. Previous ORIF of intertrochanteric fracture on the left. Previous lower lumbar fusion. IMPRESSION: No acute or traumatic finding. Previous right hip arthroplasty. Previous ORIF of intertrochanteric fracture on the left. Because of osteopenia, rotation and overlying gas an artifact, if suspicion is high, consider pelvic CT. Otherwise, these films are interpreted as negative. Electronically Signed   By: Paulina Fusi M.D.   On: 01/07/2023 17:49   DG Chest Portable 1 View  Result Date: 01/07/2023 CLINICAL DATA:  Sepsis EXAM: PORTABLE CHEST 1 VIEW COMPARISON:  07/27/2022 plain film and CT FINDINGS: Osteopenia. Old right rib fractures. Midline trachea. Mild cardiomegaly. Atherosclerosis in the transverse aorta. Mild right hemidiaphragm elevation. No pleural effusion or pneumothorax. The upper lungs are clear. Bibasilar airspace disease is similar on the left and increased on the right. IMPRESSION: Bibasilar airspace disease, similar on the left and new on the right. Favor recurrent pneumonia or aspiration. Cardiomegaly without congestive failure. Aortic Atherosclerosis (ICD10-I70.0). Electronically Signed   By: Jeronimo Greaves M.D.   On: 01/07/2023 17:18     Procedures .Critical Care  Performed by: Pricilla Loveless, MD Authorized by: Pricilla Loveless, MD   Critical care provider  statement:    Critical care time (minutes):  35   Critical care time was exclusive of:  Separately billable procedures and treating other patients   Critical care was necessary to treat or prevent imminent or life-threatening deterioration of the following conditions:  Respiratory failure and sepsis   Critical care was time spent personally by me on the following activities:  Development of treatment plan with patient or surrogate, discussions with consultants, evaluation of patient's response to treatment, examination of patient, ordering and review of laboratory studies, ordering and review of radiographic studies, ordering and performing treatments and interventions, pulse oximetry, re-evaluation of patient's condition and review of old charts     Medications Ordered in ED Medications  cefTRIAXone (ROCEPHIN) 2 g in sodium chloride 0.9 % 100 mL IVPB (0 g Intravenous Stopped 01/07/23 1545)  azithromycin (ZITHROMAX) 500 mg in sodium chloride 0.9 % 250 mL IVPB (0 mg Intravenous Stopped 01/07/23 1918)  0.45 % NaCl with KCl 20 mEq / L infusion (has no administration in time range)  methylPREDNISolone sodium succinate (SOLU-MEDROL) 125 mg/2 mL injection 80 mg (has no administration in time range)  insulin aspart (novoLOG) injection 0-15 Units (has no administration in time range)  sodium chloride 0.9 % bolus 1,000 mL (0 mLs Intravenous Stopped 01/07/23 1918)    And  sodium chloride 0.9 % bolus 1,000 mL (0 mLs Intravenous Stopped 01/07/23 1918)  acetaminophen (TYLENOL) tablet 650 mg (650 mg Oral Given 01/07/23 1551)    ED Course/ Medical Decision Making/ A&P                                 Medical Decision Making Amount and/or Complexity of Data Reviewed Independent Historian:     Details: Daughter Labs: ordered.    Details: Mild leukocytosis.   Hyperglycemia Radiology: ordered and independent interpretation performed.    Details: Right lower lobe pneumonia. ECG/medicine tests: ordered and independent interpretation performed.    Details: Sinus tachycardia.  Risk OTC drugs. Decision regarding hospitalization.   Patient is febrile and tachycardic.  Given Tylenol, fluids, IV Rocephin and azithromycin.  X-ray shows pneumonia.  Also found to be COVID-positive which could be contributing.  Seems to be doing better with these treatments.  Is requiring higher oxygen requirement than normal.  Initial lactate is just above 2 and appropriately responded to fluids.  He will need admission and I discussed case with Dr. Mariea Clonts.        Final Clinical Impression(s) / ED Diagnoses Final diagnoses:  Acute on chronic respiratory failure with hypoxia (HCC)  Pneumonia of both lower lobes due to infectious organism  COVID-19    Rx / DC Orders ED Discharge Orders     None         Pricilla Loveless, MD 01/07/23 2108

## 2023-01-07 NOTE — Assessment & Plan Note (Addendum)
Pneumonia with acute on chronic hypoxic respiratory failure and severe sepsis.  COVID test positive, likely with superimposed pneumonia considering chest x-ray findings suggesting - recurrent pneumonia or aspiration pneumonia.  Chronic and unchanged swallowing problems. -IV ceftriaxone and azithromycin -Speech therapy eval -Pro-calcitonin elevated at 0.59 -2 L bolus given, continue 1/2 N/s + 20 kcl 75cc/hr X 20 hrs -Obtain and trend inflammatory panels -Will start remdesivir -Pharmacy consult -Solumedrol 80 mg x 1, continue weight-based dosing -Monitor Glucose while on steroids

## 2023-01-07 NOTE — Assessment & Plan Note (Addendum)
Meets severe sepsis criteria with tachycardia heart rate 1 25-136, fever of 100.8, leukocytosis of 11.6, tachypnea- resp rate  21-28.  With evidence of endorgan dysfunction, respiratory failure, and lactic acidosis of 2.1 > 1.7 and encephalopathy.  Likely secondary to pneumonia.  UA not suggestive of infection. -Follow-up blood cultures -2 L bolus given, continue 1/2 N/s + 20kcl @75cc /hr + 20hrs

## 2023-01-07 NOTE — Assessment & Plan Note (Signed)
Sats down to 85% on 6 L, currently 90-95 %, on 6 L high flow nasal cannula.  3 L at baseline.  Hypoxia likely secondary to pneumonia. - ABG

## 2023-01-07 NOTE — Progress Notes (Signed)
Pharmacy Note  Jermery Caratachea is a 72 y.o. male admitted on 01/07/2023 with  COVID on Eliquis, hypoxic on 6 L high flow nasal cannula. Symptoms of 2 days.  .  Pharmacy has been consulted for COVID medication management.    Plan: Initiate patient on baricitinib 4 mg PO daily x 14 days   Height: 6' (182.9 cm) Weight: 65.8 kg (145 lb) IBW/kg (Calculated) : 77.6  Temp (24hrs), Avg:100.4 F (38 C), Min:99.9 F (37.7 C), Max:100.8 F (38.2 C)  Recent Labs  Lab 01/07/23 1427 01/07/23 1640  WBC 11.6*  --   CREATININE 1.01  --   LATICACIDVEN 2.1* 1.7    Estimated Creatinine Clearance: 60.6 mL/min (by C-G formula based on SCr of 1.01 mg/dL).    Allergies  Allergen Reactions   Barbiturates Other (See Comments)    Blacks out    Seroquel  [Quetiapine] Other (See Comments)    Hallucination   Shellfish Allergy Hives    Thank you for allowing pharmacy to be a part of this patient's care.  Merryl Hacker, PharmD Clinical Pharmacist 01/07/2023 9:51 PM

## 2023-01-07 NOTE — Assessment & Plan Note (Addendum)
History of PE.  Resume Eliquis

## 2023-01-07 NOTE — ED Triage Notes (Signed)
Pt usually stands in pivots at home Has aid Fall FRIDAY  Confused and not talking since FEVER in triage 86% on 6LPM [place]

## 2023-01-07 NOTE — Assessment & Plan Note (Addendum)
-  Hgba1c - SSI- M -Resume Levemir 6 units daily -Monitor glucose while on steroids -Hold home metformin

## 2023-01-07 NOTE — H&P (Signed)
History and Physical    Aaron Leon NWG:[id number] DOB: May 01, 1950 DOA: 01/07/2023  PCP: [name]   Patient coming from: Home  I have personally briefly reviewed patient's old medical records in [facility]  Chief Complaint: Confused, fall  HPI: Aaron Leon is a 72 y.o. male with medical history significant for Pulmonary embolism, CVA, HTN, DM, Parkinson's, dementia, COPD with chronic respiratory failure on 3 L.  Patient was brought to the ED reports of confusion, not talking, and fall.  At baseline he has dementia, with mostly short-term memory loss and intermittent confused speech, and ambulates with an electric wheelchair but he is able to answer simple questions.  At the time of my evaluation patient is awake, follows some directions, but not answering questions, history is obtained from spouse at bedside.  She reports patient fell 3 days ago, the next day he was weak and speech started getting confused.  No cough no difficulty breathing.  Urinary symptoms.  Vomiting no diarrhea.  He has had poor oral intake for the past couple of days.  He has had chronic intermittent episodes of coughing while eating, per spouse he has had many swallow evaluations-deemed normal, and he is on a normal diet. He had COVID when he was in the nursing home about 3 years ago, he did not require hospitalization.  ED Course: Febrile to 100.8, tachycardiac heart rate 120s to 130s.  Respiratory 21-28.  Sats down to 85% currently on 6 L high flow nasal cannula sats 90 to 95% WBC 11.6.  Lactic acid 2.1 > 1.7.  COVID-positive. Chest x-ray shows bibasilar airspace disease suggest recurrent pneumonia or aspiration. Pelvic x-ray, head CT, cervical CT unremarkable. IV Ceftriaxone and azithromycin given.  2 L bolus given.  Review of Systems: Unable to assess due to altered mental status.  Past Medical History:  Diagnosis Date   COPD (chronic obstructive pulmonary disease) (HCC)     Diabetes mellitus without complication (HCC)    "type 2"   H/O septic shock    Hypertension    Pulmonary embolism (HCC)    Stroke [facility])     Past Surgical History:  Procedure Laterality Date   BACK SURGERY     CATARACT EXTRACTION     CERVICAL FUSION     HIP PINNING Left    03/2022   KNEE SURGERY     TOTAL HIP ARTHROPLASTY Right 02/2021     reports that he has quit smoking. His smoking use included e-cigarettes. He has never used smokeless tobacco. He reports that he does not currently use alcohol. He reports that he does not use drugs.  Allergies  Allergen Reactions   Barbiturates Other (See Comments)    Blacks out    Seroquel  [Quetiapine] Other (See Comments)    Hallucination   Shellfish Allergy Hives   Family history of hypertension.  Prior to Admission medications   Medication Sig Start Date End Date Taking? Authorizing Provider  acetaminophen (TYLENOL) 500 MG tablet Take 500 mg by mouth every 6 (six) hours as needed for moderate pain (pain score 4-6).   Yes [provider]  albuterol (PROVENTIL) (2.5 MG/3ML) 0.083% nebulizer solution Take 2.5 mg by nebulization every 6 (six) hours as needed for wheezing or shortness of breath.   Yes [provider]  albuterol (VENTOLIN HFA) 108 (90 Base) MCG/ACT inhaler Inhale 2 puffs into the lungs every 6 (six) hours as needed for wheezing or shortness of breath.   Yes [provider]  apixaban (ELIQUIS) 5 MG TABS tablet Take 1 tablet (5 mg total) by mouth 2 (two) times daily. 06/24/22  Yes Shahmehdi, Seyed A, MD  ascorbic acid (VITAMIN C) 500 MG tablet Take 500 mg by mouth daily.   Yes [provider]  aspirin 325 MG tablet Take 325 mg by mouth daily.   Yes [provider]  atorvastatin (LIPITOR) 40 MG tablet Take 1 tablet (40 mg total) by mouth at bedtime. 06/19/22 01/07/23 Yes Shahmehdi, Gemma Payor, MD  baclofen (LIORESAL) 10 MG tablet Take 10-20 mg by mouth daily.   Yes [provider]   bisacodyl (DULCOLAX) 5 MG EC tablet Take 1 tablet (5 mg total) by mouth daily. Patient taking differently: Take 5 mg by mouth daily as needed for moderate constipation. 06/19/22  Yes Shahmehdi, Seyed A, MD  budesonide-formoterol (SYMBICORT) 80-4.5 MCG/ACT inhaler Inhale 2 puffs into the lungs 2 (two) times daily.   Yes [provider]  carbidopa-levodopa (SINEMET CR) 50-200 MG tablet Take 1 tablet by mouth at bedtime. 06/19/22 01/07/23 Yes Shahmehdi, Seyed A, MD  carbidopa-levodopa (SINEMET IR) 25-100 MG tablet Take 2 tablets by mouth 3 (three) times daily. 02/20/21  Yes [provider]  cetirizine (ZYRTEC) 10 MG tablet Take 10 mg by mouth in the morning.    Yes [provider]  cholecalciferol (VITAMIN D3) 25 MCG (1000 UNIT) tablet Take 1,000 Units by mouth in the morning and at bedtime.   Yes [provider]  clotrimazole (LOTRIMIN) 1 % cream Apply to affected area 2 times daily 08/25/22  Yes Pollyann Savoy, MD  ferrous sulfate 325 (65 FE) MG EC tablet Take 325 mg by mouth in the morning.    Yes [provider]  furosemide (LASIX) 20 MG tablet Take 5 mg by mouth at bedtime.   Yes [provider]  gabapentin (NEURONTIN) 300 MG capsule Take 2 capsules (600 mg total) by mouth 2 (two) times daily with breakfast and lunch. 06/19/22 01/07/23 Yes Shahmehdi, Seyed A, MD  galantamine (RAZADYNE ER) 24 MG 24 hr capsule Take 24 mg by mouth every evening. 02/20/21  Yes [provider]  insulin aspart (NOVOLOG) 100 UNIT/ML injection Inject 1-3 Units into the skin 3 (three) times daily before meals. Per sliding scale   Yes [provider]  insulin detemir (LEVEMIR) 100 UNIT/ML injection Inject 6 Units into the skin every morning.   Yes [provider]  lisinopril (ZESTRIL) 2.5 MG tablet Take 2.5 mg by mouth daily.   Yes [provider]  magnesium oxide (MAG-OX) 400 (240 Mg) MG tablet Take 400-800 mg by mouth daily. 2 tablets  in the morning and 1 tablet in the evening   Yes [provider]  melatonin 3 MG TABS tablet Take 3 mg by mouth at bedtime.   Yes [provider]  memantine (NAMENDA) 10 MG tablet Take 20 mg by mouth at bedtime.   Yes [provider]  metFORMIN (GLUCOPHAGE) 1000 MG tablet Take 500 mg by mouth 2 (two) times daily with a meal.   Yes [provider]  metoprolol tartrate (LOPRESSOR) 25 MG tablet Take 12.5 mg by mouth 2 (two) times daily.   Yes [provider]  omeprazole (PRILOSEC) 20 MG capsule Take 20 mg by mouth in the morning.    Yes [provider]  oxybutynin (DITROPAN-XL) 10 MG 24 hr tablet Take 10 mg by mouth at bedtime.   Yes [provider]  oxyCODONE-acetaminophen (PERCOCET) 10-325  MG tablet Take 1 tablet by mouth every 6 (six) hours as needed for pain.   Yes [provider]  sertraline (ZOLOFT) 100 MG tablet Take 100 mg by mouth in the morning.    Yes [provider]  tamsulosin (FLOMAX) 0.4 MG CAPS capsule Take 0.4 mg by mouth at bedtime.   Yes [provider]  VITAMIN A PO Take by mouth.   Yes [provider]  zolpidem (AMBIEN) 10 MG tablet Take 10 mg by mouth at bedtime.   Yes [provider]    Physical Exam: Limited exam due to altered mental status Vitals:   01/07/23 1730 01/07/23 1800 01/07/23 1830 01/07/23 1835  BP: 134/72 (!) 141/65 (!) 149/74   Pulse: 76 77 76   Resp: 15 19 (!) 22   Temp:    99.9 F (37.7 C)  TempSrc:    Oral  SpO2: 93% 90% 95%   Weight:      Height:        Constitutional: NAD, calm, comfortable Vitals:   01/07/23 1730 01/07/23 1800 01/07/23 1830 01/07/23 1835  BP: 134/72 (!) 141/65 (!) 149/74   Pulse: 76 77 76   Resp: 15 19 (!) 22   Temp:    99.9 F (37.7 C)  TempSrc:    Oral  SpO2: 93% 90% 95%   Weight:      Height:       Eyes: PERRL, lids and conjunctivae normal ENMT: Mucous membranes are very dry  Neck: normal, supple, no masses,  no thyromegaly Respiratory: clear to auscultation bilaterally, no wheezing, no crackles.  Mild increased work of breathing. No accessory muscle use.  Cardiovascular: Regular rate and rhythm, no murmurs / rubs / gallops. No extremity edema.  Extremities warm. Abdomen: no tenderness, no masses palpated. No hepatosplenomegaly.   Musculoskeletal: no clubbing / cyanosis. No joint deformity upper and lower extremities.  Skin: Clean dressing to right heel, no rashes, lesions, ulcers. No induration Neurologic: Without exam due to altered mental status, moving all extremities spontaneously, good equal grip strength bilaterally, no facial asymmetry Psychiatric: Awake, lethargic.  Labs on Admission: I have personally reviewed following labs and imaging studies  CBC: Recent Labs  Lab 01/07/23 1427  WBC 11.6*  NEUTROABS 10.4*  HGB 14.2  HCT 47.5  MCV 88.3  PLT 345   Basic Metabolic Panel: Recent Labs  Lab 01/07/23 1427  NA 145  K 3.7  CL 105  CO2 22  GLUCOSE 294*  BUN 32*  CREATININE 1.01  CALCIUM 9.0   GFR: Estimated Creatinine Clearance: 60.6 mL/min (by C-G formula based on SCr of 1.01 mg/dL). Liver Function Tests: Recent Labs  Lab 01/07/23 1427  AST 14*  ALT 13  ALKPHOS 93  BILITOT 1.0  PROT 7.6  ALBUMIN 3.2*   No results for input(s): "LIPASE", "AMYLASE" in the last 168 hours. No results for input(s): "AMMONIA" in the last 168 hours. Coagulation Profile: Recent Labs  Lab 01/07/23 1427  INR 1.3*   Cardiac Enzymes: No results for input(s): "CKTOTAL", "CKMB", "CKMBINDEX", "TROPONINI" in the last 168 hours. BNP (last 3 results) No results for input(s): "PROBNP" in the last 8760 hours. HbA1C: No results for input(s): "HGBA1C" in the last 72 hours. CBG: No results for input(s): "GLUCAP" in the last 168 hours. Lipid Profile: No results for input(s): "CHOL", "HDL", "LDLCALC", "TRIG", "CHOLHDL", "LDLDIRECT" in the last 72 hours. Thyroid Function Tests: No results for  input(s): "TSH", "T4TOTAL", "FREET4", "T3FREE", "THYROIDAB" in the last 72  hours. Anemia Panel: No results for input(s): "VITAMINB12", "FOLATE", "FERRITIN", "TIBC", "IRON", "RETICCTPCT" in the last 72 hours. Urine analysis:    Component Value Date/Time   COLORURINE YELLOW 01/07/2023 1556   APPEARANCEUR HAZY (A) 01/07/2023 1556   LABSPEC 1.025 01/07/2023 1556   PHURINE 5.0 01/07/2023 1556   GLUCOSEU 150 (A) 01/07/2023 1556   HGBUR SMALL (A) 01/07/2023 1556   BILIRUBINUR NEGATIVE 01/07/2023 1556   KETONESUR 20 (A) 01/07/2023 1556   PROTEINUR >=300 (A) 01/07/2023 1556   NITRITE NEGATIVE 01/07/2023 1556   LEUKOCYTESUR NEGATIVE 01/07/2023 1556    Radiological Exams on Admission: CT Head Wo Contrast  Result Date: 01/07/2023 CLINICAL DATA:  Head trauma, minor (Age >= 65y); Neck trauma (Age >= 65y) EXAM: CT HEAD WITHOUT CONTRAST CT CERVICAL SPINE WITHOUT CONTRAST TECHNIQUE: Multidetector CT imaging of the head and cervical spine was performed following the standard protocol without intravenous contrast. Multiplanar CT image reconstructions of the cervical spine were also generated. RADIATION DOSE REDUCTION: This exam was performed according to the departmental dose-optimization program which includes automated exposure control, adjustment of the mA and/or kV according to patient size and/or use of iterative reconstruction technique. COMPARISON:  None Available. FINDINGS: CT HEAD FINDINGS Brain: No evidence of acute infarction, hemorrhage, hydrocephalus, extra-axial collection or mass lesion/mass effect. Vascular: Calcific atherosclerosis. Skull: No acute fracture. Sinuses/Orbits: Sinuses.  No acute orbital findings. Other: No mastoid effusions CT CERVICAL SPINE FINDINGS Alignment: No substantial sagittal subluxation. Skull base and vertebrae: No acute fracture. Similar chronic height loss at C7. Vertebral heights are maintained. Osteopenia. C5-T1 ACDF. Soft tissues and spinal canal: No prevertebral  fluid or swelling. No visible canal hematoma. Disc levels: Multilevel bony degenerative change including facet and uncovertebral hypertrophy and disc height loss. Upper chest: Emphysema. IMPRESSION: No evidence of acute abnormality intracranially or in the cervical spine. Electronically Signed   By: Feliberto Harts M.D.   On: 01/07/2023 18:19   CT Cervical Spine Wo Contrast  Result Date: 01/07/2023 CLINICAL DATA:  Head trauma, minor (Age >= 65y); Neck trauma (Age >= 65y) EXAM: CT HEAD WITHOUT CONTRAST CT CERVICAL SPINE WITHOUT CONTRAST TECHNIQUE: Multidetector CT imaging of the head and cervical spine was performed following the standard protocol without intravenous contrast. Multiplanar CT image reconstructions of the cervical spine were also generated. RADIATION DOSE REDUCTION: This exam was performed according to the departmental dose-optimization program which includes automated exposure control, adjustment of the mA and/or kV according to patient size and/or use of iterative reconstruction technique. COMPARISON:  None Available. FINDINGS: CT HEAD FINDINGS Brain: No evidence of acute infarction, hemorrhage, hydrocephalus, extra-axial collection or mass lesion/mass effect. Vascular: Calcific atherosclerosis. Skull: No acute fracture. Sinuses/Orbits: Sinuses.  No acute orbital findings. Other: No mastoid effusions CT CERVICAL SPINE FINDINGS Alignment: No substantial sagittal subluxation. Skull base and vertebrae: No acute fracture. Similar chronic height loss at C7. Vertebral heights are maintained. Osteopenia. C5-T1 ACDF. Soft tissues and spinal canal: No prevertebral fluid or swelling. No visible canal hematoma. Disc levels: Multilevel bony degenerative change including facet and uncovertebral hypertrophy and disc height loss. Upper chest: Emphysema. IMPRESSION: No evidence of acute abnormality intracranially or in the cervical spine. Electronically Signed   By: Feliberto Harts M.D.   On: 01/07/2023  18:19   DG Pelvis 1-2 Views  Result Date: 01/07/2023 CLINICAL DATA:  Larey Seat several days ago. EXAM: PELVIS - 1-2 VIEW COMPARISON:  CT 06/17/2022 FINDINGS: The patient is rotated towards the right. The bones are osteopenic, limiting sensitivity. I do not see evidence  of regional fracture. Previous hip arthroplasty on the right. Previous ORIF of intertrochanteric fracture on the left. Previous lower lumbar fusion. IMPRESSION: No acute or traumatic finding. Previous right hip arthroplasty. Previous ORIF of intertrochanteric fracture on the left. Because of osteopenia, rotation and overlying gas an artifact, if suspicion is high, consider pelvic CT. Otherwise, these films are interpreted as negative. Electronically Signed   By: Paulina Fusi M.D.   On: 01/07/2023 17:49   DG Chest Portable 1 View  Result Date: 01/07/2023 CLINICAL DATA:  Sepsis EXAM: PORTABLE CHEST 1 VIEW COMPARISON:  07/27/2022 plain film and CT FINDINGS: Osteopenia. Old right rib fractures. Midline trachea. Mild cardiomegaly. Atherosclerosis in the transverse aorta. Mild right hemidiaphragm elevation. No pleural effusion or pneumothorax. The upper lungs are clear. Bibasilar airspace disease is similar on the left and increased on the right. IMPRESSION: Bibasilar airspace disease, similar on the left and new on the right. Favor recurrent pneumonia or aspiration. Cardiomegaly without congestive failure. Aortic Atherosclerosis (ICD10-I70.0). Electronically Signed   By: Jeronimo Greaves M.D.   On: 01/07/2023 17:18    EKG: Independently reviewed.  Sinus tachycardia rate 133, QTc 458.  PVCs.  No significant change from prior.  Assessment/Plan Principal Problem:   Acute on chronic respiratory failure with hypoxia (HCC) Active Problems:   Pulmonary embolism (HCC)   Pneumonia due to COVID-19 virus   Severe sepsis (HCC)   Acute metabolic encephalopathy   Diabetes mellitus (HCC)   Essential hypertension   Dementia with behavioral disturbance  (HCC)   Assessment and Plan: * Acute on chronic respiratory failure with hypoxia (HCC) Sats down to 85% on 6 L, currently 90-95 %, on 6 L high flow nasal cannula.  3 L at baseline.  Hypoxia likely secondary to pneumonia. - ABG  Acute metabolic encephalopathy Presenting with confusion, fall, lethargy. Likely due to severe sepsis, pneumonia, dehydration with underlying dementia. Head Cervical CT negative for acute abnormality. -IV fluids, antibiotics  Severe sepsis (HCC) Meets severe sepsis criteria with tachycardia heart rate 1 25-136, fever of 100.8, leukocytosis of 11.6, tachypnea- resp rate  21-28.  With evidence of endorgan dysfunction, respiratory failure, and lactic acidosis of 2.1 > 1.7 and encephalopathy.  Likely secondary to pneumonia.  UA not suggestive of infection. -Follow-up blood cultures -2 L bolus given, continue 1/2 N/s + 20kcl @75cc /hr + 20hrs  Pneumonia due to COVID-19 virus Pneumonia with acute on chronic hypoxic respiratory failure and severe sepsis.  COVID test positive, likely with superimposed pneumonia considering chest x-ray findings suggesting - recurrent pneumonia or aspiration pneumonia.  Chronic and unchanged swallowing problems. -IV ceftriaxone and azithromycin -Speech therapy eval -Pro-calcitonin elevated at 0.59 -2 L bolus given, continue 1/2 N/s + 20 kcl 75cc/hr X 20 hrs -Obtain and trend inflammatory panels -Will start remdesivir -Pharmacy consult -Solumedrol 80 mg x 1, continue weight-based dosing -Monitor Glucose while on steroids  Pulmonary embolism (HCC) History of PE.  Resume Eliquis  Dementia with behavioral disturbance (HCC) Baseline dementia, and Parkinson's disease. -Resume Sinemet  Essential hypertension Stable. -Resume metoprolol 12.5, lisinopril 2.5  Diabetes mellitus (HCC) -Hgba1c - SSI- M -Resume Levemir 6 units daily -Monitor glucose while on steroids -Hold home metformin    DVT prophylaxis: Eliquis Code Status: FULL  code- confirmed with spouse at bedside Family Communication: Spouse at bedside Disposition Plan: > 2 days Consults called: None Admission status: Inpt Stepdown I certify that at the point of admission it is my clinical judgment that the patient will require inpatient hospital care spanning beyond  2 midnights from the point of admission due to high intensity of service, high risk for further deterioration and high frequency of surveillance required.   Author: Onnie Boer, MD 01/07/2023 8:58 PM  For on call review [email or web address].

## 2023-01-07 NOTE — ED Notes (Signed)
Admission questions answered with the assistance of patient's daughter, [name]. She is a great historian. Aaron Leon

## 2023-01-07 NOTE — Assessment & Plan Note (Signed)
Presenting with confusion, fall, lethargy. Likely due to severe sepsis, pneumonia, dehydration with underlying dementia. Head Cervical CT negative for acute abnormality. -IV fluids, antibiotics

## 2023-01-07 NOTE — Progress Notes (Signed)
[facility]link is following sepsis bundle. 

## 2023-01-07 NOTE — ED Triage Notes (Signed)
Per family pt was complaining of LEFT rib pain and face pain after the fall

## 2023-01-07 NOTE — ED Notes (Signed)
Daughter stated that pt still has pressure injury to RIGHT heel

## 2023-01-08 DIAGNOSIS — G9341 Metabolic encephalopathy: Secondary | ICD-10-CM | POA: Diagnosis not present

## 2023-01-08 DIAGNOSIS — J9621 Acute and chronic respiratory failure with hypoxia: Secondary | ICD-10-CM | POA: Diagnosis not present

## 2023-01-08 DIAGNOSIS — U071 COVID-19: Secondary | ICD-10-CM | POA: Diagnosis present

## 2023-01-08 DIAGNOSIS — I10 Essential (primary) hypertension: Secondary | ICD-10-CM | POA: Diagnosis not present

## 2023-01-08 LAB — BASIC METABOLIC PANEL
Anion gap: 8 (ref 5–15)
BUN: 32 mg/dL — ABNORMAL HIGH (ref 8–23)
CO2: 27 mmol/L (ref 22–32)
Calcium: 7.9 mg/dL — ABNORMAL LOW (ref 8.9–10.3)
Chloride: 107 mmol/L (ref 98–111)
Creatinine, Ser: 0.89 mg/dL (ref 0.61–1.24)
GFR, Estimated: >60 mL/min (ref >60)
Glucose, Bld: 146 mg/dL — ABNORMAL HIGH (ref 70–99)
Potassium: 3.6 mmol/L (ref 3.5–5.1)
Sodium: 142 mmol/L (ref 135–145)

## 2023-01-08 LAB — CBC
HCT: 37.3 % — ABNORMAL LOW (ref 39.0–52.0)
Hemoglobin: 11.3 g/dL — ABNORMAL LOW (ref 13.0–17.0)
MCH: 27 pg (ref 26.0–34.0)
MCHC: 30.3 g/dL (ref 30.0–36.0)
MCV: 89.2 fL (ref 80.0–100.0)
Platelets: 245 10*3/uL (ref 150–400)
RBC: 4.18 MIL/uL — ABNORMAL LOW (ref 4.22–5.81)
RDW: 17 % — ABNORMAL HIGH (ref 11.5–15.5)
WBC: 5.4 10*3/uL (ref 4.0–10.5)
nRBC: 0 % (ref 0.0–0.2)

## 2023-01-08 LAB — C-REACTIVE PROTEIN
CRP: 35.4 mg/dL — ABNORMAL HIGH (ref <1.0)
CRP: 33.1 mg/dL — ABNORMAL HIGH (ref <1.0)

## 2023-01-08 LAB — CBG MONITORING, ED
Glucose-Capillary: 147 mg/dL — ABNORMAL HIGH (ref 70–99)
Glucose-Capillary: 154 mg/dL — ABNORMAL HIGH (ref 70–99)
Glucose-Capillary: 154 mg/dL — ABNORMAL HIGH (ref 70–99)
Glucose-Capillary: 160 mg/dL — ABNORMAL HIGH (ref 70–99)

## 2023-01-08 LAB — GLUCOSE, CAPILLARY
Glucose-Capillary: 128 mg/dL — ABNORMAL HIGH (ref 70–99)
Glucose-Capillary: 115 mg/dL — ABNORMAL HIGH (ref 70–99)

## 2023-01-08 LAB — FERRITIN: Ferritin: 372 ng/mL — ABNORMAL HIGH (ref 24–336)

## 2023-01-08 LAB — HEMOGLOBIN A1C
Hgb A1c MFr Bld: 7 % — ABNORMAL HIGH (ref 4.8–5.6)
Mean Plasma Glucose: 154.2 mg/dL

## 2023-01-08 LAB — LACTATE DEHYDROGENASE: LDH: 144 U/L (ref 98–192)

## 2023-01-08 LAB — D-DIMER, QUANTITATIVE: D-Dimer, Quant: 1.13 ug{FEU}/mL — ABNORMAL HIGH (ref 0.00–0.50)

## 2023-01-08 MED ORDER — OXYCODONE HCL 5 MG PO TABS
2.5000 mg | ORAL_TABLET | Freq: Four times a day (QID) | ORAL | Status: DC | PRN
  Administered 2023-01-08 – 2023-01-10 (×3): 2.5 mg via ORAL
  Filled 2023-01-08 (×3): qty 1

## 2023-01-08 MED ORDER — ONDANSETRON HCL 4 MG/2ML IJ SOLN: 4.0000 mg | Freq: Four times a day (QID) | INTRAMUSCULAR | Status: DC | PRN

## 2023-01-08 MED ORDER — SERTRALINE HCL 50 MG PO TABS
100.0000 mg | ORAL_TABLET | Freq: Every morning | ORAL | Status: DC
  Administered 2023-01-08 – 2023-01-10 (×3): 100 mg via ORAL
  Filled 2023-01-08 (×3): qty 2

## 2023-01-08 MED ORDER — ACETAMINOPHEN 325 MG PO TABS
650.0000 mg | ORAL_TABLET | Freq: Four times a day (QID) | ORAL | Status: DC | PRN
  Administered 2023-01-08: 650 mg via ORAL
  Filled 2023-01-08: qty 2

## 2023-01-08 MED ORDER — BACLOFEN 10 MG PO TABS
10.0000 mg | ORAL_TABLET | Freq: Every day | ORAL | Status: DC
  Administered 2023-01-08 – 2023-01-09 (×2): 20 mg via ORAL
  Administered 2023-01-10: 10 mg via ORAL
  Filled 2023-01-08 (×3): qty 2

## 2023-01-08 MED ORDER — POLYETHYLENE GLYCOL 3350 17 G PO PACK: 17.0000 g | PACK | Freq: Every day | ORAL | Status: DC | PRN

## 2023-01-08 MED ORDER — ALBUTEROL SULFATE (2.5 MG/3ML) 0.083% IN NEBU: 2.5000 mg | INHALATION_SOLUTION | Freq: Four times a day (QID) | RESPIRATORY_TRACT | Status: DC | PRN

## 2023-01-08 MED ORDER — METHYLPREDNISOLONE SODIUM SUCC 40 MG IJ SOLR
0.5000 mg/kg | Freq: Two times a day (BID) | INTRAMUSCULAR | Status: DC
  Administered 2023-01-08 – 2023-01-10 (×5): 32.8 mg via INTRAVENOUS
  Filled 2023-01-08 (×5): qty 1

## 2023-01-08 MED ORDER — AMIODARONE IV BOLUS ONLY 150 MG/100ML
150.0000 mg | Freq: Once | INTRAVENOUS | Status: AC
  Administered 2023-01-08: 150 mg via INTRAVENOUS
  Filled 2023-01-08: qty 100

## 2023-01-08 MED ORDER — PREDNISONE 20 MG PO TABS: 50.0000 mg | ORAL_TABLET | Freq: Every day | ORAL | Status: DC

## 2023-01-08 MED ORDER — LISINOPRIL 5 MG PO TABS
2.5000 mg | ORAL_TABLET | Freq: Every day | ORAL | Status: DC
  Administered 2023-01-08 – 2023-01-10 (×3): 2.5 mg via ORAL
  Filled 2023-01-08 (×3): qty 1

## 2023-01-08 MED ORDER — SODIUM CHLORIDE 0.9 % IV SOLN: 100.0000 mg | Freq: Every day | INTRAVENOUS | Status: DC

## 2023-01-08 MED ORDER — SODIUM CHLORIDE 0.9 % IV SOLN: 200.0000 mg | Freq: Once | INTRAVENOUS | Status: DC

## 2023-01-08 MED ORDER — CARBIDOPA-LEVODOPA ER 50-200 MG PO TBCR
1.0000 | EXTENDED_RELEASE_TABLET | Freq: Every day | ORAL | Status: DC
  Administered 2023-01-08 – 2023-01-09 (×2): 1 via ORAL
  Filled 2023-01-08 (×6): qty 1

## 2023-01-08 MED ORDER — MOMETASONE FURO-FORMOTEROL FUM 100-5 MCG/ACT IN AERO
2.0000 | INHALATION_SPRAY | Freq: Two times a day (BID) | RESPIRATORY_TRACT | Status: DC
  Administered 2023-01-08 – 2023-01-10 (×5): 2 via RESPIRATORY_TRACT
  Filled 2023-01-08: qty 8.8

## 2023-01-08 MED ORDER — TAMSULOSIN HCL 0.4 MG PO CAPS
0.4000 mg | ORAL_CAPSULE | Freq: Every day | ORAL | Status: DC
  Administered 2023-01-08 – 2023-01-09 (×2): 0.4 mg via ORAL
  Filled 2023-01-08 (×2): qty 1

## 2023-01-08 MED ORDER — MELATONIN 3 MG PO TABS
3.0000 mg | ORAL_TABLET | Freq: Every evening | ORAL | Status: DC | PRN
  Administered 2023-01-08: 3 mg via ORAL
  Filled 2023-01-08: qty 1

## 2023-01-08 MED ORDER — LORATADINE 10 MG PO TABS
10.0000 mg | ORAL_TABLET | Freq: Every day | ORAL | Status: DC
  Administered 2023-01-08 – 2023-01-10 (×3): 10 mg via ORAL
  Filled 2023-01-08 (×3): qty 1

## 2023-01-08 MED ORDER — SODIUM CHLORIDE 0.9 % IV SOLN
100.0000 mg | Freq: Once | INTRAVENOUS | Status: AC
  Administered 2023-01-08: 100 mg via INTRAVENOUS
  Filled 2023-01-08: qty 20

## 2023-01-08 MED ORDER — METOPROLOL TARTRATE 25 MG PO TABS
12.5000 mg | ORAL_TABLET | Freq: Two times a day (BID) | ORAL | Status: DC
  Administered 2023-01-08 – 2023-01-10 (×5): 12.5 mg via ORAL
  Filled 2023-01-08 (×5): qty 1

## 2023-01-08 MED ORDER — RISPERIDONE 0.5 MG PO TABS
0.2500 mg | ORAL_TABLET | Freq: Every day | ORAL | Status: DC | PRN
  Administered 2023-01-09: 0.25 mg via ORAL
  Filled 2023-01-08: qty 1

## 2023-01-08 MED ORDER — PANTOPRAZOLE SODIUM 40 MG PO TBEC
40.0000 mg | DELAYED_RELEASE_TABLET | Freq: Every day | ORAL | Status: DC
  Administered 2023-01-08 – 2023-01-10 (×3): 40 mg via ORAL
  Filled 2023-01-08 (×3): qty 1

## 2023-01-08 MED ORDER — ACETAMINOPHEN 650 MG RE SUPP: 650.0000 mg | Freq: Four times a day (QID) | RECTAL | Status: DC | PRN

## 2023-01-08 MED ORDER — APIXABAN 5 MG PO TABS
5.0000 mg | ORAL_TABLET | Freq: Two times a day (BID) | ORAL | Status: DC
  Administered 2023-01-08 – 2023-01-10 (×5): 5 mg via ORAL
  Filled 2023-01-08 (×5): qty 1

## 2023-01-08 MED ORDER — INSULIN DETEMIR 100 UNIT/ML ~~LOC~~ SOLN
6.0000 [IU] | Freq: Every morning | SUBCUTANEOUS | Status: DC
  Administered 2023-01-08 – 2023-01-10 (×3): 6 [IU] via SUBCUTANEOUS
  Filled 2023-01-08 (×5): qty 0.06

## 2023-01-08 MED ORDER — SODIUM CHLORIDE 0.9 % IV SOLN
100.0000 mg | Freq: Every day | INTRAVENOUS | Status: DC
  Filled 2023-01-08: qty 20

## 2023-01-08 MED ORDER — ONDANSETRON HCL 4 MG PO TABS: 4.0000 mg | ORAL_TABLET | Freq: Four times a day (QID) | ORAL | Status: DC | PRN

## 2023-01-08 NOTE — TOC Initial Note (Signed)
Transition of Care [facility]) - Initial/Assessment Note    Patient Details  Name: Aaron Leon MRN: [id number] Date of Birth: 1950/09/03  [facility] CM/SW Contact:    Villa Herb, LCSWA Phone Number: 01/08/2023, 2:03 PM  Clinical Narrative:                 Patient is high risk for readmission. CSW spoke with pts spouse to complete assessment. Pt lives with his spouse who also assists with pts ADLs. Pts spouse drives to appointments. Pt has had HH in the past. Pt has an electric wheel chair, grab bars, cane, walker and all needed DME. Pt is on 3L home O2 at baseline. [facility]. [facility] notification ID is [id number]. TOC to follow.   Expected Discharge Plan: Home w Home Health Services Barriers to Discharge: Continued Medical Work up   Patient Goals and CMS Choice Patient states their goals for this hospitalization and ongoing recovery are:: return home CMS Medicare.gov Compare Post Acute Care list provided to:: Patient Represenative (must comment) Choice offered to / list presented to : Patient, Spouse      Expected Discharge Plan and Services In-house Referral: Clinical Social Work Discharge Planning Services: CM Consult Post Acute Care Choice: Home Health Living arrangements for the past 2 months: Single Family Home                                      Prior Living Arrangements/Services Living arrangements for the past 2 months: Single Family Home Lives with:: Self, Spouse Patient language and need for interpreter reviewed:: Yes Do you feel safe going back to the place where you live?: Yes      Need for Family Participation in Patient Care: Yes (Comment) Care giver support system in place?: Yes (comment) Current home services: DME Criminal Activity/Legal Involvement Pertinent to Current Situation/Hospitalization: No - Comment as needed  Activities of Daily Living   ADL Screening (condition at time of  admission) Independently performs ADLs?: No Does the patient have a NEW difficulty with bathing/dressing/toileting/self-feeding that is expected to last >3 days?: Yes (Initiates electronic notice to provider for possible OT consult) Does the patient have a NEW difficulty with getting in/out of bed, walking, or climbing stairs that is expected to last >3 days?: Yes (Initiates electronic notice to provider for possible PT consult) Does the patient have a NEW difficulty with communication that is expected to last >3 days?: Yes (Initiates electronic notice to provider for possible SLP consult) Is the patient deaf or have difficulty hearing?: Yes Does the patient have difficulty seeing, even when wearing glasses/contacts?: No Does the patient have difficulty concentrating, remembering, or making decisions?: Yes  Permission Sought/Granted                  Emotional Assessment Appearance:: Appears stated age Attitude/Demeanor/Rapport: Engaged Affect (typically observed): Accepting   Alcohol / Substance Use: Not Applicable Psych Involvement: No (comment)  Admission diagnosis:  Acute on chronic respiratory failure with hypoxia (HCC) [J96.21] COVID-19 [U07.1] Patient Active Problem List   Diagnosis Date Noted   COVID-19 01/08/2023   Pneumonia due to COVID-19 virus 01/07/2023   Severe sepsis (HCC) 01/07/2023   Acute metabolic encephalopathy 01/07/2023   Acute on chronic respiratory failure with hypoxia (HCC) 07/27/2022   CAP (community acquired pneumonia) 07/27/2022   Pulmonary embolism (HCC) 06/17/2022   Diabetes mellitus (HCC) 06/17/2022  Essential hypertension 06/17/2022   Hyperlipidemia 06/17/2022   Debility 06/17/2022   Dementia with behavioral disturbance (HCC) 06/17/2022    Class: Chronic   Lobar pneumonia (HCC) 06/17/2022   Urinary retention 06/17/2022   Testicular pain 06/17/2022   History of CVA (cerebrovascular accident) 06/17/2022   PCP:  [name] Pharmacy:   [id number] - [place], [place] - [address] [facility] [address] [place] [address] Phone: [phone] Fax: [phone]  [facility] - [place], [place] - [address] [place] [address] Phone: [phone] Fax: [phone]     Social Determinants of Health (SDOH) Social History: SDOH Screenings   Food Insecurity: No Food Insecurity (01/08/2023)  Housing: Low Risk  (01/08/2023)  Transportation Needs: No Transportation Needs (01/08/2023)  Utilities: Not At Risk (01/08/2023)  Financial Resource Strain: Medium Risk (06/05/2022)   Received from [facility]  Social Connections: Unknown (08/03/2021)   Received from [facility], [facility]  Stress: No Stress Concern Present (06/01/2022)   Received from [facility], [facility]  Tobacco Use: Medium Risk (01/07/2023)   SDOH Interventions:     Readmission Risk Interventions    01/08/2023    2:01 PM 07/28/2022    5:22 PM  Readmission Risk Prevention Plan  Transportation Screening Complete Complete  PCP or Specialist Appt within 3-5 Days  Complete  HRI or Home Care Consult  Complete  Social Work Consult for Recovery Care Planning/Counseling  Complete  Palliative Care Screening  Not Applicable  Medication Review Oceanographer) Complete Complete  HRI or Home Care Consult Complete   SW Recovery Care/Counseling Consult Complete   Palliative Care Screening Not Applicable   Skilled Nursing Facility Not Applicable

## 2023-01-08 NOTE — Progress Notes (Signed)
SLP Cancellation Note  Patient Details Name: Aaron Leon MRN: [id number] DOB: 01-25-1951   Cancelled treatment:       Reason Eval/Treat Not Completed: Patient at procedure or test/unavailable. Pt just moved to the 300 floor and is unavailable for swallowing evaluation at this time. ST will re-attempt tomorrow. Thank you,  [name] H. Romie Levee, CCC-SLP Speech Language Pathologist    Georgetta Haber 01/08/2023, 4:32 PM

## 2023-01-08 NOTE — Progress Notes (Signed)
Patient arrived to unit , VS stable he repositioned in bed , complained of back pain 5/10 , wife at bedside .     01/08/23 1641  Vitals  Temp 98.5 F (36.9 C)  BP (!) 157/77  Pulse Rate (!) 59  Resp 20  MEWS COLOR  MEWS Score Color Green  Oxygen Therapy  SpO2 100 %  O2 Device HFNC  O2 Flow Rate (L/min) 4 L/min  MEWS Score  MEWS Temp 0  MEWS Systolic 0  MEWS Pulse 0  MEWS RR 0  MEWS LOC 0  MEWS Score 0

## 2023-01-08 NOTE — ED Notes (Signed)
Per pt, this RN called pt's spouse. Call would not go through at this time.

## 2023-01-08 NOTE — Progress Notes (Signed)
[facility]                                                                               [name], is a 72 y.o. male, DOB - 1950/07/12, NWG:[id number] Admit date - 01/07/2023    Outpatient Primary MD for the patient is Clinic, [facility]  [place] - 1  days    Brief summary   Aaron Leon is a 72 y.o. male with medical history significant for Pulmonary embolism, CVA, HTN, DM, Parkinson's, dementia, COPD with chronic respiratory failure on 3 L.  He has had chronic intermittent episodes of coughing while eating, per spouse he has had many swallow evaluations-deemed normal, and he is on a normal diet.  Chest x-ray shows bibasilar airspace disease suggest recurrent pneumonia or aspiration. Pelvic x-ray, head CT, cervical CT unremarkable. IV Ceftriaxone and azithromycin given.    Assessment & Plan    Assessment and Plan: * Acute on chronic respiratory failure with hypoxia (HCC) Patient is on 3 L of nasal cannula oxygen at baseline currently requiring up to 6 L to keep sats greater than 90%.  Acute respiratory failure probably secondary to pneumonia and COVID infection  Acute metabolic encephalopathy Presenting with confusion, fall, lethargy. Likely due to severe sepsis, pneumonia, dehydration with underlying dementia. Head Cervical CT negative for acute abnormality. Continue with IV fluids and antibiotics  Severe sepsis (HCC) Meets severe sepsis criteria with tachycardia heart rate 125-136, fever of 100.8, hypotensive with blood pressure of 86/69 mm hg, leukocytosis of 11.6, tachypnea- resp rate  21-28, hypoxia oxygen sats around 85% on 3 L of nasal cannula oxygen,  with evidence of endorgan dysfunction, respiratory failure, and lactic acidosis of 2.1 > 1.7 and encephalopathy.   Probably secondary to pneumonia. Sepsis physiology improving.  Patient this morning is afebrile heart rate around 50s. Continue to follow-up blood cultures.    Pneumonia due to COVID-19  virus Pneumonia with acute on chronic hypoxic respiratory failure and severe sepsis.  COVID test positive, likely with superimposed pneumonia considering chest x-ray findings suggesting - recurrent pneumonia or aspiration pneumonia.  Chronic and unchanged swallowing problems.  Continue with IV Rocephin and Zithromax.  SLP evaluations ordered and pending follow inflammatory markers. Continue with baricitinib and steroids Patient currently on 6 L of nasal cannula oxygen.  Pulmonary embolism (HCC) History of PE.  Resume Eliquis  Dementia with behavioral disturbance (HCC) Baseline dementia, and Parkinson's disease. -Resume Sinemet  Essential hypertension Well-controlled blood pressure parameters   Diabetes mellitus (HCC) CBG (last 3)  Recent Labs    01/08/23 0003 01/08/23 0349 01/08/23 0828  GLUCAP 154* 147* 154*   Continue with sliding scale insulin and Semglee and follow-up hemoglobin A1c.        RN Pressure Injury Documentation: Pressure Injury 07/27/22 Heel Right Deep Tissue Pressure Injury - Purple or maroon localized area of discolored intact skin or blood-filled blister due to damage of underlying soft tissue from pressure and/or shear. (Active)  07/27/22 2000  Location: Heel  Location Orientation: Right  Staging: Deep Tissue Pressure Injury - Purple or maroon localized area of discolored intact skin or blood-filled blister due to damage of underlying soft tissue from  pressure and/or shear.  Wound Description (Comments):   Present on Admission:      Estimated body mass index is 19.67 kg/m as calculated from the following:   Height as of this encounter: 6' (1.829 m).   Weight as of this encounter: 65.8 kg.  Code Status: Full code DVT Prophylaxis:   apixaban (ELIQUIS) tablet 5 mg   Level of Care: Level of care: Telemetry Family Communication: Family at bedside  Disposition Plan:     Remains inpatient appropriate: IV antibiotics and IV steroids  Procedures:   None.   Consultants:   None  Antimicrobials:   Anti-infectives (From admission, onward)    Start     Dose/Rate Route Frequency Ordered Stop   01/09/23 1000  remdesivir 100 mg in sodium chloride 0.9 % 100 mL IVPB  Status:  Discontinued       Placed in "Followed by" Linked Group   100 mg 200 mL/hr over 30 Minutes Intravenous Daily 01/08/23 0109 01/08/23 0116   01/09/23 1000  remdesivir 100 mg in sodium chloride 0.9 % 100 mL IVPB       Placed in "Followed by" Linked Group   100 mg 200 mL/hr over 30 Minutes Intravenous Daily 01/08/23 0116 01/11/23 0959   01/08/23 0200  remdesivir 100 mg in sodium chloride 0.9 % 100 mL IVPB       Placed in "Followed by" Linked Group   100 mg 200 mL/hr over 30 Minutes Intravenous  Once 01/08/23 0116 01/08/23 0351   01/08/23 0130  remdesivir 100 mg in sodium chloride 0.9 % 100 mL IVPB       Placed in "Followed by" Linked Group   100 mg 200 mL/hr over 30 Minutes Intravenous  Once 01/08/23 0116 01/08/23 0310   01/08/23 0115  remdesivir 200 mg in sodium chloride 0.9% 250 mL IVPB  Status:  Discontinued       Placed in "Followed by" Linked Group   200 mg 580 mL/hr over 30 Minutes Intravenous Once 01/08/23 0109 01/08/23 0116   01/07/23 1445  cefTRIAXone (ROCEPHIN) 2 g in sodium chloride 0.9 % 100 mL IVPB        2 g 200 mL/hr over 30 Minutes Intravenous Every 24 hours 01/07/23 1439 01/12/23 1444   01/07/23 1445  azithromycin (ZITHROMAX) 500 mg in sodium chloride 0.9 % 250 mL IVPB        500 mg 250 mL/hr over 60 Minutes Intravenous Every 24 hours 01/07/23 1439 01/12/23 1444        Medications  Scheduled Meds:  apixaban  5 mg Oral BID   baclofen  10-20 mg Oral Daily   baricitinib  4 mg Oral Daily   carbidopa-levodopa  1 tablet Oral QHS   insulin aspart  0-15 Units Subcutaneous Q4H   insulin detemir  6 Units Subcutaneous q morning   lisinopril  2.5 mg Oral Daily   loratadine  10 mg Oral Daily   methylPREDNISolone (SOLU-MEDROL) injection  0.5  mg/kg Intravenous Q12H   Followed by   Melene Muller ON 01/11/2023] predniSONE  50 mg Oral Daily   metoprolol tartrate  12.5 mg Oral BID   mometasone-formoterol  2 puff Inhalation BID   pantoprazole  40 mg Oral Daily   sertraline  100 mg Oral q AM   tamsulosin  0.4 mg Oral QHS   Continuous Infusions:  0.45 % NaCl with KCl 20 mEq / L 75 mL/hr at 01/08/23 0740   azithromycin Stopped (01/07/23 1918)   cefTRIAXone (ROCEPHIN)  IV Stopped (01/07/23 1545)   [START ON 01/09/2023] remdesivir 100 mg in sodium chloride 0.9 % 100 mL IVPB     PRN Meds:.acetaminophen **OR** acetaminophen, albuterol, ondansetron **OR** ondansetron (ZOFRAN) IV, polyethylene glycol    Subjective:   Aaron Leon was seen and examined today.  Reports breathing has improved.   Objective:   Vitals:   01/08/23 0747 01/08/23 0930 01/08/23 0950 01/08/23 1200  BP:  133/84  (!) 156/72  Pulse:  87  (!) 50  Resp:  (!) 27  15  Temp:   97.9 F (36.6 C)   TempSrc:   Oral   SpO2: 95% 93%  95%  Weight:      Height:        Intake/Output Summary (Last 24 hours) at 01/08/2023 1216 Last data filed at 01/08/2023 0740 Gross per 24 hour  Intake 3827.57 ml  Output --  Net 3827.57 ml   Filed Weights   01/07/23 1414  Weight: 65.8 kg     Exam General: Alert and oriented x 3, NAD Cardiovascular: S1 S2 auscultated, no murmurs, RRR Respiratory: scattered rhonchi heard. Tachypnea and on 6 lit of [place] oxygen.  Gastrointestinal: Soft, nontender, nondistended, + bowel sounds Ext: no pedal edema bilaterally Neuro: alert and answering all questions appropriately. Oriented to person only.  Skin: No rashes Psych: Normal affect    Data Reviewed:  I have personally reviewed following labs and imaging studies   CBC Lab Results  Component Value Date   WBC 5.4 01/08/2023   RBC 4.18 (L) 01/08/2023   HGB 11.3 (L) 01/08/2023   HCT 37.3 (L) 01/08/2023   MCV 89.2 01/08/2023   MCH 27.0 01/08/2023   PLT 245 01/08/2023   MCHC 30.3  01/08/2023   RDW 17.0 (H) 01/08/2023   LYMPHSABS 0.6 (L) 01/07/2023   MONOABS 0.6 01/07/2023   EOSABS 0.0 01/07/2023   BASOSABS 0.0 01/07/2023     Last metabolic panel Lab Results  Component Value Date   NA 142 01/08/2023   K 3.6 01/08/2023   CL 107 01/08/2023   CO2 27 01/08/2023   BUN 32 (H) 01/08/2023   CREATININE 0.89 01/08/2023   GLUCOSE 146 (H) 01/08/2023   GFRNONAA >60 01/08/2023   GFRAA  03/21/2008    >60        The eGFR has been calculated using the MDRD equation. This calculation has not been validated in all clinical   CALCIUM 7.9 (L) 01/08/2023   PHOS 3.0 06/17/2022   PROT 7.6 01/07/2023   ALBUMIN 3.2 (L) 01/07/2023   BILITOT 1.0 01/07/2023   ALKPHOS 93 01/07/2023   AST 14 (L) 01/07/2023   ALT 13 01/07/2023   ANIONGAP 8 01/08/2023    CBG (last 3)  Recent Labs    01/08/23 0003 01/08/23 0349 01/08/23 0828  GLUCAP 154* 147* 154*      Coagulation Profile: Recent Labs  Lab 01/07/23 1427  INR 1.3*     Radiology Studies: CT Head Wo Contrast  Result Date: 01/07/2023 CLINICAL DATA:  Head trauma, minor (Age >= 65y); Neck trauma (Age >= 65y) EXAM: CT HEAD WITHOUT CONTRAST CT CERVICAL SPINE WITHOUT CONTRAST TECHNIQUE: Multidetector CT imaging of the head and cervical spine was performed following the standard protocol without intravenous contrast. Multiplanar CT image reconstructions of the cervical spine were also generated. RADIATION DOSE REDUCTION: This exam was performed according to the departmental dose-optimization program which includes automated exposure control, adjustment of the mA and/or kV according to patient size and/or use of  iterative reconstruction technique. COMPARISON:  None Available. FINDINGS: CT HEAD FINDINGS Brain: No evidence of acute infarction, hemorrhage, hydrocephalus, extra-axial collection or mass lesion/mass effect. Vascular: Calcific atherosclerosis. Skull: No acute fracture. Sinuses/Orbits: Sinuses.  No acute orbital  findings. Other: No mastoid effusions CT CERVICAL SPINE FINDINGS Alignment: No substantial sagittal subluxation. Skull base and vertebrae: No acute fracture. Similar chronic height loss at C7. Vertebral heights are maintained. Osteopenia. C5-T1 ACDF. Soft tissues and spinal canal: No prevertebral fluid or swelling. No visible canal hematoma. Disc levels: Multilevel bony degenerative change including facet and uncovertebral hypertrophy and disc height loss. Upper chest: Emphysema. IMPRESSION: No evidence of acute abnormality intracranially or in the cervical spine. Electronically Signed   By: Feliberto Harts M.D.   On: 01/07/2023 18:19   CT Cervical Spine Wo Contrast  Result Date: 01/07/2023 CLINICAL DATA:  Head trauma, minor (Age >= 65y); Neck trauma (Age >= 65y) EXAM: CT HEAD WITHOUT CONTRAST CT CERVICAL SPINE WITHOUT CONTRAST TECHNIQUE: Multidetector CT imaging of the head and cervical spine was performed following the standard protocol without intravenous contrast. Multiplanar CT image reconstructions of the cervical spine were also generated. RADIATION DOSE REDUCTION: This exam was performed according to the departmental dose-optimization program which includes automated exposure control, adjustment of the mA and/or kV according to patient size and/or use of iterative reconstruction technique. COMPARISON:  None Available. FINDINGS: CT HEAD FINDINGS Brain: No evidence of acute infarction, hemorrhage, hydrocephalus, extra-axial collection or mass lesion/mass effect. Vascular: Calcific atherosclerosis. Skull: No acute fracture. Sinuses/Orbits: Sinuses.  No acute orbital findings. Other: No mastoid effusions CT CERVICAL SPINE FINDINGS Alignment: No substantial sagittal subluxation. Skull base and vertebrae: No acute fracture. Similar chronic height loss at C7. Vertebral heights are maintained. Osteopenia. C5-T1 ACDF. Soft tissues and spinal canal: No prevertebral fluid or swelling. No visible canal hematoma.  Disc levels: Multilevel bony degenerative change including facet and uncovertebral hypertrophy and disc height loss. Upper chest: Emphysema. IMPRESSION: No evidence of acute abnormality intracranially or in the cervical spine. Electronically Signed   By: Feliberto Harts M.D.   On: 01/07/2023 18:19   DG Pelvis 1-2 Views  Result Date: 01/07/2023 CLINICAL DATA:  Larey Seat several days ago. EXAM: PELVIS - 1-2 VIEW COMPARISON:  CT 06/17/2022 FINDINGS: The patient is rotated towards the right. The bones are osteopenic, limiting sensitivity. I do not see evidence of regional fracture. Previous hip arthroplasty on the right. Previous ORIF of intertrochanteric fracture on the left. Previous lower lumbar fusion. IMPRESSION: No acute or traumatic finding. Previous right hip arthroplasty. Previous ORIF of intertrochanteric fracture on the left. Because of osteopenia, rotation and overlying gas an artifact, if suspicion is high, consider pelvic CT. Otherwise, these films are interpreted as negative. Electronically Signed   By: Paulina Fusi M.D.   On: 01/07/2023 17:49   DG Chest Portable 1 View  Result Date: 01/07/2023 CLINICAL DATA:  Sepsis EXAM: PORTABLE CHEST 1 VIEW COMPARISON:  07/27/2022 plain film and CT FINDINGS: Osteopenia. Old right rib fractures. Midline trachea. Mild cardiomegaly. Atherosclerosis in the transverse aorta. Mild right hemidiaphragm elevation. No pleural effusion or pneumothorax. The upper lungs are clear. Bibasilar airspace disease is similar on the left and increased on the right. IMPRESSION: Bibasilar airspace disease, similar on the left and new on the right. Favor recurrent pneumonia or aspiration. Cardiomegaly without congestive failure. Aortic Atherosclerosis (ICD10-I70.0). Electronically Signed   By: Jeronimo Greaves M.D.   On: 01/07/2023 17:18       Kathlen Mody M.D. [facility] 01/08/2023, 12:16  PM  Available via Epic secure chat 7am-7pm After 7 pm, please refer to night  coverage provider listed on amion.

## 2023-01-08 NOTE — ED Notes (Signed)
ED TO INPATIENT HANDOFF REPORT  ED Nurse Name and Phone #: Wandra Mannan, Paramedic  S Name/Age/Gender Aaron Leon 72 y.o. male Room/Bed: APA03/APA03  Code Status   Code Status: Full Code  Home/SNF/Other Home Patient oriented to: self Is this baseline? Yes   Triage Complete: Triage complete  Chief Complaint Acute on chronic respiratory failure with hypoxia [facility]) [J96.21]  Triage Note Pt usually stands in pivots at home Has aid Fall FRIDAY  Confused and not talking since FEVER in triage 86% on 6LPM [place]   Per family pt was complaining of LEFT rib pain and face pain after the fall   Allergies Allergies  Allergen Reactions   Barbiturates Other (See Comments)    Blacks out    Seroquel  [Quetiapine] Other (See Comments)    Hallucination   Shellfish Allergy Hives    Level of Care/Admitting Diagnosis ED Disposition     ED Disposition  Admit   Condition  --   Comment  Hospital Area: [facility] [[id number]]  Level of Care: Stepdown [14]  Covid Evaluation: Asymptomatic - no recent exposure (last 10 days) testing not required  Diagnosis: Acute on chronic respiratory failure with hypoxia [facility]) [[id number]]  Admitting Physician: Cresenciano Lick  Attending Physician: Onnie Boer [phone]  Certification:: I certify this patient will need inpatient services for at least 2 midnights  Expected Medical Readiness: 01/10/2023          B Medical/Surgery History Past Medical History:  Diagnosis Date   COPD (chronic obstructive pulmonary disease) (HCC)    Diabetes mellitus without complication (HCC)    "type 2"   H/O septic shock    Hypertension    Pulmonary embolism (HCC)    Stroke [facility])    Past Surgical History:  Procedure Laterality Date   BACK SURGERY     CATARACT EXTRACTION     CERVICAL FUSION     HIP PINNING Left    03/2022   KNEE SURGERY     TOTAL HIP ARTHROPLASTY Right 02/2021     A IV  Location/Drains/Wounds Patient Lines/Drains/Airways Status     Active Line/Drains/Airways     Name Placement date Placement time Site Days   Peripheral IV 01/07/23 18 G Anterior;Distal;Right;Upper Arm 01/07/23  1512  Arm  1   Pressure Injury 07/27/22 Heel Right Deep Tissue Pressure Injury - Purple or maroon localized area of discolored intact skin or blood-filled blister due to damage of underlying soft tissue from pressure and/or shear. 07/27/22  2000  -- 165            Intake/Output Last 24 hours  Intake/Output Summary (Last 24 hours) at 01/08/2023 1121 Last data filed at 01/08/2023 0740 Gross per 24 hour  Intake 3827.57 ml  Output --  Net 3827.57 ml    Labs/Imaging Results for orders placed or performed during the hospital encounter of 01/07/23 (from the past 48 hour(s))  APTT     Status: None   Collection Time: 01/07/23  2:24 PM  Result Value Ref Range   aPTT 36 24 - 36 seconds    Comment: Performed at [facility], [address] [place], [place] [address]  Procalcitonin     Status: None   Collection Time: 01/07/23  2:24 PM  Result Value Ref Range   Procalcitonin 0.59 ng/mL    Comment:        Interpretation: PCT > 0.5 ng/mL and <= 2 ng/mL: Systemic infection (sepsis) is possible, but other conditions are  known to elevate PCT as well. (NOTE)       Sepsis PCT Algorithm           Lower Respiratory Tract                                      Infection PCT Algorithm    ----------------------------     ----------------------------         PCT < 0.25 ng/mL                PCT < 0.10 ng/mL          Strongly encourage             Strongly discourage   discontinuation of antibiotics    initiation of antibiotics    ----------------------------     -----------------------------       PCT 0.25 - 0.50 ng/mL            PCT 0.10 - 0.25 ng/mL               OR       >80% decrease in PCT            Discourage initiation of                                            antibiotics       Encourage discontinuation           of antibiotics    ----------------------------     -----------------------------         PCT >= 0.50 ng/mL              PCT 0.26 - 0.50 ng/mL                AND       <80% decrease in PCT             Encourage initiation of                                             antibiotics       Encourage continuation           of antibiotics    ----------------------------     -----------------------------        PCT >= 0.50 ng/mL                  PCT > 0.50 ng/mL               AND         increase in PCT                  Strongly encourage                                      initiation of antibiotics    Strongly encourage escalation           of antibiotics                                     -----------------------------  PCT <= 0.25 ng/mL                                                 OR                                        > 80% decrease in PCT                                      Discontinue / Do not initiate                                             antibiotics  Performed at [facility], [address], [place], [place] [address]   Comprehensive metabolic panel     Status: Abnormal   Collection Time: 01/07/23  2:27 PM  Result Value Ref Range   Sodium 145 135 - 145 mmol/L   Potassium 3.7 3.5 - 5.1 mmol/L   Chloride 105 98 - 111 mmol/L   CO2 22 22 - 32 mmol/L   Glucose, Bld 294 (H) 70 - 99 mg/dL    Comment: Glucose reference range applies only to samples taken after fasting for at least 8 hours.   BUN 32 (H) 8 - 23 mg/dL   Creatinine, Ser 6.04 0.61 - 1.24 mg/dL   Calcium 9.0 8.9 - 54.0 mg/dL   Total Protein 7.6 6.5 - 8.1 g/dL   Albumin 3.2 (L) 3.5 - 5.0 g/dL   AST 14 (L) 15 - 41 U/L   ALT 13 0 - 44 U/L   Alkaline Phosphatase 93 38 - 126 U/L   Total Bilirubin 1.0 0.3 - 1.2 mg/dL   GFR, Estimated >98 >11 mL/min    Comment: (NOTE) Calculated using the CKD-EPI Creatinine Equation (2021)     Anion gap 18 (H) 5 - 15    Comment: Performed at [facility], [address], [place], [place] [address]  Lactic acid, plasma     Status: Abnormal   Collection Time: 01/07/23  2:27 PM  Result Value Ref Range   Lactic Acid, Venous 2.1 (HH) 0.5 - 1.9 mmol/L    Comment: CRITICAL RESULT CALLED TO, READ BACK BY AND VERIFIED WITH ANGIE FLETCHER @ 1547 ON 01/07/23 C VARNER Performed at [facility], [address] [place], [place] [address]   CBC with Differential     Status: Abnormal   Collection Time: 01/07/23  2:27 PM  Result Value Ref Range   WBC 11.6 (H) 4.0 - 10.5 K/uL   RBC 5.38 4.22 - 5.81 MIL/uL   Hemoglobin 14.2 13.0 - 17.0 g/dL   HCT 13.0 86.5 - 78.4 %   MCV 88.3 80.0 - 100.0 fL   MCH 26.4 26.0 - 34.0 pg   MCHC 29.9 (L) 30.0 - 36.0 g/dL   RDW 69.6 (H) 29.5 - 28.4 %   Platelets 345 150 - 400 K/uL   nRBC 0.0 0.0 - 0.2 %   Neutrophils Relative % 90 %   Neutro Abs 10.4 (H) 1.7 - 7.7 K/uL   Lymphocytes Relative  5 %   Lymphs Abs 0.6 (L) 0.7 - 4.0 K/uL   Monocytes Relative 5 %   Monocytes Absolute 0.6 0.1 - 1.0 K/uL   Eosinophils Relative 0 %   Eosinophils Absolute 0.0 0.0 - 0.5 K/uL   Basophils Relative 0 %   Basophils Absolute 0.0 0.0 - 0.1 K/uL   Immature Granulocytes 0 %   Abs Immature Granulocytes 0.03 0.00 - 0.07 K/uL    Comment: Performed at [facility], [address] [place], [place] [address]  Protime-INR     Status: Abnormal   Collection Time: 01/07/23  2:27 PM  Result Value Ref Range   Prothrombin Time 16.0 (H) 11.4 - 15.2 seconds   INR 1.3 (H) 0.8 - 1.2    Comment: (NOTE) INR goal varies based on device and disease states. Performed at [facility], [address], [place], [place] [address]   Culture, blood (Routine x 2)     Status: None (Preliminary result)   Collection Time: 01/07/23  2:27 PM   Specimen: BLOOD  Result Value Ref Range   Specimen Description BLOOD BLOOD RIGHT ARM    Special Requests      BOTTLES DRAWN AEROBIC ONLY Blood Culture results may  not be optimal due to an excessive volume of blood received in culture bottles   Culture      NO GROWTH < 24 HOURS Performed at [facility], [address] [place], [place] [address]    Report Status PENDING   D-dimer, quantitative     Status: Abnormal   Collection Time: 01/07/23  2:27 PM  Result Value Ref Range   D-Dimer, Quant 1.33 (H) 0.00 - 0.50 ug/mL-FEU    Comment: (NOTE) At the manufacturer cut-off value of 0.5 g/mL FEU, this assay has a negative predictive value of 95-100%.This assay is intended for use in conjunction with a clinical pretest probability (PTP) assessment model to exclude pulmonary embolism (PE) and deep venous thrombosis (DVT) in outpatients suspected of PE or DVT. Results should be correlated with clinical presentation. Performed at [facility], [address], [place], [place] [address]   Ferritin     Status: Abnormal   Collection Time: 01/07/23  2:27 PM  Result Value Ref Range   Ferritin 346 (H) 24 - 336 ng/mL    Comment: Performed at [facility], [address], [place], [place] [address]  Hemoglobin A1c     Status: Abnormal   Collection Time: 01/07/23  2:27 PM  Result Value Ref Range   Hgb A1c MFr Bld 7.0 (H) 4.8 - 5.6 %    Comment: (NOTE) Pre diabetes:          5.7%-6.4%  Diabetes:              >6.4%  Glycemic control for   <7.0% adults with diabetes    Mean Plasma Glucose 154.2 mg/dL    Comment: Performed at [facility], [address] [place], [place] [address]  Culture, blood (Routine x 2)     Status: None (Preliminary result)   Collection Time: 01/07/23  2:29 PM   Specimen: BLOOD  Result Value Ref Range   Specimen Description BLOOD BLOOD LEFT HAND    Special Requests      BOTTLES DRAWN AEROBIC AND ANAEROBIC Blood Culture results may not be optimal due to an excessive volume of blood received in culture bottles   Culture      NO GROWTH < 24 HOURS Performed at [facility], [address], [place], [place] [address]    Report  Status PENDING   Resp panel by RT-PCR (RSV, Flu A&B, Covid) Anterior Nasal Swab     Status: Abnormal   Collection Time: 01/07/23  2:39 PM   Specimen: Anterior Nasal Swab  Result Value Ref Range   SARS Coronavirus 2 by RT PCR POSITIVE (A) NEGATIVE    Comment: (NOTE) SARS-CoV-2 target nucleic acids are DETECTED.  The SARS-CoV-2 RNA is generally detectable in upper respiratory specimens during the acute phase of infection. Positive results are indicative of the presence of the identified virus, but do not rule out bacterial infection or co-infection with other pathogens not detected by the test. Clinical correlation with patient history and other diagnostic information is necessary to determine patient infection status. The expected result is Negative.  Fact Sheet for Patients: BloggerCourse.com  Fact Sheet for Healthcare Providers: SeriousBroker.it  This test is not yet approved or cleared by the [place] FDA and  has been authorized for detection and/or diagnosis of SARS-CoV-2 by FDA under an Emergency Use Authorization (EUA).  This EUA will remain in effect (meaning this test can be used) for the duration of  the COVID-19 declaration under Section 564(b)(1) of the A ct, 21 U.S.C. section 360bbb-3(b)(1), unless the authorization is terminated or revoked sooner.     Influenza A by PCR NEGATIVE NEGATIVE   Influenza B by PCR NEGATIVE NEGATIVE    Comment: (NOTE) The Xpert Xpress SARS-CoV-2/FLU/RSV plus assay is intended as an aid in the diagnosis of influenza from Nasopharyngeal swab specimens and should not be used as a sole basis for treatment. Nasal washings and aspirates are unacceptable for Xpert Xpress SARS-CoV-2/FLU/RSV testing.  Fact Sheet for Patients: BloggerCourse.com  Fact Sheet for Healthcare Providers: SeriousBroker.it  This test is not yet approved or cleared  by the [place] FDA and has been authorized for detection and/or diagnosis of SARS-CoV-2 by FDA under an Emergency Use Authorization (EUA). This EUA will remain in effect (meaning this test can be used) for the duration of the COVID-19 declaration under Section 564(b)(1) of the Act, 21 U.S.C. section 360bbb-3(b)(1), unless the authorization is terminated or revoked.     Resp Syncytial Virus by PCR NEGATIVE NEGATIVE    Comment: (NOTE) Fact Sheet for Patients: BloggerCourse.com  Fact Sheet for Healthcare Providers: SeriousBroker.it  This test is not yet approved or cleared by the [place] FDA and has been authorized for detection and/or diagnosis of SARS-CoV-2 by FDA under an Emergency Use Authorization (EUA). This EUA will remain in effect (meaning this test can be used) for the duration of the COVID-19 declaration under Section 564(b)(1) of the Act, 21 U.S.C. section 360bbb-3(b)(1), unless the authorization is terminated or revoked.  Performed at [facility], [address], [place], [place] [address]   Urinalysis, w/ Reflex to Culture (Infection Suspected) -Urine, Clean Catch     Status: Abnormal   Collection Time: 01/07/23  3:56 PM  Result Value Ref Range   Specimen Source URINE, CATHETERIZED    Color, Urine YELLOW YELLOW   APPearance HAZY (A) CLEAR   Specific Gravity, Urine 1.025 1.005 - 1.030   pH 5.0 5.0 - 8.0   Glucose, UA 150 (A) NEGATIVE mg/dL   Hgb urine dipstick SMALL (A) NEGATIVE   Bilirubin Urine NEGATIVE NEGATIVE   Ketones, ur 20 (A) NEGATIVE mg/dL   Protein, ur >=657 (A) NEGATIVE mg/dL   Nitrite NEGATIVE NEGATIVE   Leukocytes,Ua NEGATIVE NEGATIVE   RBC / HPF 11-20 0 -  5 RBC/hpf   WBC, UA 0-5 0 - 5 WBC/hpf    Comment:        Reflex urine culture not performed if WBC <=10, OR if Squamous epithelial cells >5. If Squamous epithelial cells >5 suggest recollection.    Bacteria, UA RARE (A) NONE SEEN    Squamous Epithelial / HPF 0-5 0 - 5 /HPF   Mucus PRESENT    Hyaline Casts, UA PRESENT    Granular Casts, UA PRESENT    Non Squamous Epithelial 0-5 (A) NONE SEEN    Comment: Performed at [facility], [address] [place], [place] [address]  Lactic acid, plasma     Status: None   Collection Time: 01/07/23  4:40 PM  Result Value Ref Range   Lactic Acid, Venous 1.7 0.5 - 1.9 mmol/L    Comment: Performed at [facility], [address], [place], [place] [address]  C-reactive protein     Status: Abnormal   Collection Time: 01/07/23  9:19 PM  Result Value Ref Range   CRP 35.4 (H) <1.0 mg/dL    Comment: Performed at [facility], [address], [place], [place] [address]  Lactate dehydrogenase     Status: None   Collection Time: 01/07/23  9:19 PM  Result Value Ref Range   LDH 156 98 - 192 U/L    Comment: Performed at [facility], [address], [place], [place] [address]  CBG monitoring, ED     Status: Abnormal   Collection Time: 01/07/23  9:36 PM  Result Value Ref Range   Glucose-Capillary 193 (H) 70 - 99 mg/dL    Comment: Glucose reference range applies only to samples taken after fasting for at least 8 hours.  Blood gas, arterial     Status: Abnormal   Collection Time: 01/07/23  9:42 PM  Result Value Ref Range   FIO2 68.0 %   pH, Arterial 7.46 (H) 7.35 - 7.45   pCO2 arterial 36 32 - 48 mmHg   pO2, Arterial 60 (L) 83 - 108 mmHg   Bicarbonate 25.5 20.0 - 28.0 mmol/L   Acid-Base Excess 2.0 0.0 - 2.0 mmol/L   O2 Saturation 92.2 %   Patient temperature 37.7    Collection site RIGHT RADIAL    Drawn by [id number]    Allens test (pass/fail) PASS PASS    Comment: Performed at [facility], [address] [place], [place] [address]  MRSA Next Gen by PCR, Nasal     Status: None   Collection Time: 01/07/23 10:00 PM   Specimen: Nasal Mucosa; Nasal Swab  Result Value Ref Range   MRSA by PCR Next Gen NOT DETECTED NOT DETECTED    Comment: (NOTE) The GeneXpert MRSA Assay (FDA  approved for NASAL specimens only), is one component of a comprehensive MRSA colonization surveillance program. It is not intended to diagnose MRSA infection nor to guide or monitor treatment for MRSA infections. Test performance is not FDA approved in patients less than 31 years old. Performed at [facility], [address], [place], [place] [address]   CBG monitoring, ED     Status: Abnormal   Collection Time: 01/08/23 12:03 AM  Result Value Ref Range   Glucose-Capillary 154 (H) 70 - 99 mg/dL    Comment: Glucose reference range applies only to samples taken after fasting for at least 8 hours.  C-reactive protein     Status: Abnormal   Collection Time: 01/08/23  3:05 AM  Result Value Ref Range  CRP 33.1 (H) <1.0 mg/dL    Comment: Performed at [facility], [address] [place], [place] [address]  Ferritin     Status: Abnormal   Collection Time: 01/08/23  3:05 AM  Result Value Ref Range   Ferritin 372 (H) 24 - 336 ng/mL    Comment: Performed at [facility], [address], [place], [place] [address]  Lactate dehydrogenase     Status: None   Collection Time: 01/08/23  3:05 AM  Result Value Ref Range   LDH 144 98 - 192 U/L    Comment: Performed at [facility], [address] [place], [place] [address]  D-dimer, quantitative     Status: Abnormal   Collection Time: 01/08/23  3:05 AM  Result Value Ref Range   D-Dimer, Quant 1.13 (H) 0.00 - 0.50 ug/mL-FEU    Comment: (NOTE) At the manufacturer cut-off value of 0.5 g/mL FEU, this assay has a negative predictive value of 95-100%.This assay is intended for use in conjunction with a clinical pretest probability (PTP) assessment model to exclude pulmonary embolism (PE) and deep venous thrombosis (DVT) in outpatients suspected of PE or DVT. Results should be correlated with clinical presentation. Performed at [facility], [address], [place], [place] [address]   Basic metabolic panel     Status: Abnormal   Collection  Time: 01/08/23  3:05 AM  Result Value Ref Range   Sodium 142 135 - 145 mmol/L   Potassium 3.6 3.5 - 5.1 mmol/L   Chloride 107 98 - 111 mmol/L   CO2 27 22 - 32 mmol/L   Glucose, Bld 146 (H) 70 - 99 mg/dL    Comment: Glucose reference range applies only to samples taken after fasting for at least 8 hours.   BUN 32 (H) 8 - 23 mg/dL   Creatinine, Ser 2.95 0.61 - 1.24 mg/dL   Calcium 7.9 (L) 8.9 - 10.3 mg/dL   GFR, Estimated >62 >13 mL/min    Comment: (NOTE) Calculated using the CKD-EPI Creatinine Equation (2021)    Anion gap 8 5 - 15    Comment: Performed at [facility], [address] [place], [place] [address]  CBC     Status: Abnormal   Collection Time: 01/08/23  3:05 AM  Result Value Ref Range   WBC 5.4 4.0 - 10.5 K/uL   RBC 4.18 (L) 4.22 - 5.81 MIL/uL   Hemoglobin 11.3 (L) 13.0 - 17.0 g/dL   HCT 84.6 (L) 96.2 - 95.2 %   MCV 89.2 80.0 - 100.0 fL   MCH 27.0 26.0 - 34.0 pg   MCHC 30.3 30.0 - 36.0 g/dL   RDW 84.1 (H) 32.4 - 40.1 %   Platelets 245 150 - 400 K/uL   nRBC 0.0 0.0 - 0.2 %    Comment: Performed at [facility], [address] [place], [place] [address]  CBG monitoring, ED     Status: Abnormal   Collection Time: 01/08/23  3:49 AM  Result Value Ref Range   Glucose-Capillary 147 (H) 70 - 99 mg/dL    Comment: Glucose reference range applies only to samples taken after fasting for at least 8 hours.  CBG monitoring, ED     Status: Abnormal   Collection Time: 01/08/23  8:28 AM  Result Value Ref Range   Glucose-Capillary 154 (H) 70 - 99 mg/dL    Comment: Glucose reference range applies only to samples taken after fasting for at least 8 hours.   CT Head Wo  Contrast  Result Date: 01/07/2023 CLINICAL DATA:  Head trauma, minor (Age >= 65y); Neck trauma (Age >= 65y) EXAM: CT HEAD WITHOUT CONTRAST CT CERVICAL SPINE WITHOUT CONTRAST TECHNIQUE: Multidetector CT imaging of the head and cervical spine was performed following the standard protocol without intravenous contrast.  Multiplanar CT image reconstructions of the cervical spine were also generated. RADIATION DOSE REDUCTION: This exam was performed according to the departmental dose-optimization program which includes automated exposure control, adjustment of the mA and/or kV according to patient size and/or use of iterative reconstruction technique. COMPARISON:  None Available. FINDINGS: CT HEAD FINDINGS Brain: No evidence of acute infarction, hemorrhage, hydrocephalus, extra-axial collection or mass lesion/mass effect. Vascular: Calcific atherosclerosis. Skull: No acute fracture. Sinuses/Orbits: Sinuses.  No acute orbital findings. Other: No mastoid effusions CT CERVICAL SPINE FINDINGS Alignment: No substantial sagittal subluxation. Skull base and vertebrae: No acute fracture. Similar chronic height loss at C7. Vertebral heights are maintained. Osteopenia. C5-T1 ACDF. Soft tissues and spinal canal: No prevertebral fluid or swelling. No visible canal hematoma. Disc levels: Multilevel bony degenerative change including facet and uncovertebral hypertrophy and disc height loss. Upper chest: Emphysema. IMPRESSION: No evidence of acute abnormality intracranially or in the cervical spine. Electronically Signed   By: Feliberto Harts M.D.   On: 01/07/2023 18:19   CT Cervical Spine Wo Contrast  Result Date: 01/07/2023 CLINICAL DATA:  Head trauma, minor (Age >= 65y); Neck trauma (Age >= 65y) EXAM: CT HEAD WITHOUT CONTRAST CT CERVICAL SPINE WITHOUT CONTRAST TECHNIQUE: Multidetector CT imaging of the head and cervical spine was performed following the standard protocol without intravenous contrast. Multiplanar CT image reconstructions of the cervical spine were also generated. RADIATION DOSE REDUCTION: This exam was performed according to the departmental dose-optimization program which includes automated exposure control, adjustment of the mA and/or kV according to patient size and/or use of iterative reconstruction technique.  COMPARISON:  None Available. FINDINGS: CT HEAD FINDINGS Brain: No evidence of acute infarction, hemorrhage, hydrocephalus, extra-axial collection or mass lesion/mass effect. Vascular: Calcific atherosclerosis. Skull: No acute fracture. Sinuses/Orbits: Sinuses.  No acute orbital findings. Other: No mastoid effusions CT CERVICAL SPINE FINDINGS Alignment: No substantial sagittal subluxation. Skull base and vertebrae: No acute fracture. Similar chronic height loss at C7. Vertebral heights are maintained. Osteopenia. C5-T1 ACDF. Soft tissues and spinal canal: No prevertebral fluid or swelling. No visible canal hematoma. Disc levels: Multilevel bony degenerative change including facet and uncovertebral hypertrophy and disc height loss. Upper chest: Emphysema. IMPRESSION: No evidence of acute abnormality intracranially or in the cervical spine. Electronically Signed   By: Feliberto Harts M.D.   On: 01/07/2023 18:19   DG Pelvis 1-2 Views  Result Date: 01/07/2023 CLINICAL DATA:  Larey Seat several days ago. EXAM: PELVIS - 1-2 VIEW COMPARISON:  CT 06/17/2022 FINDINGS: The patient is rotated towards the right. The bones are osteopenic, limiting sensitivity. I do not see evidence of regional fracture. Previous hip arthroplasty on the right. Previous ORIF of intertrochanteric fracture on the left. Previous lower lumbar fusion. IMPRESSION: No acute or traumatic finding. Previous right hip arthroplasty. Previous ORIF of intertrochanteric fracture on the left. Because of osteopenia, rotation and overlying gas an artifact, if suspicion is high, consider pelvic CT. Otherwise, these films are interpreted as negative. Electronically Signed   By: Paulina Fusi M.D.   On: 01/07/2023 17:49   DG Chest Portable 1 View  Result Date: 01/07/2023 CLINICAL DATA:  Sepsis EXAM: PORTABLE CHEST 1 VIEW COMPARISON:  07/27/2022 plain film and CT FINDINGS: Osteopenia. Old right  rib fractures. Midline trachea. Mild cardiomegaly. Atherosclerosis in  the transverse aorta. Mild right hemidiaphragm elevation. No pleural effusion or pneumothorax. The upper lungs are clear. Bibasilar airspace disease is similar on the left and increased on the right. IMPRESSION: Bibasilar airspace disease, similar on the left and new on the right. Favor recurrent pneumonia or aspiration. Cardiomegaly without congestive failure. Aortic Atherosclerosis (ICD10-I70.0). Electronically Signed   By: Jeronimo Greaves M.D.   On: 01/07/2023 17:18    Pending Labs Unresulted Labs (From admission, onward)     Start     Ordered   01/08/23 0500  C-reactive protein  Daily,   R      01/07/23 2056   01/08/23 0500  Ferritin  Daily,   R      01/07/23 2056   01/08/23 0500  Lactate dehydrogenase  Daily,   R      01/07/23 2056   01/08/23 0500  D-dimer, quantitative  Daily,   R      01/07/23 2056            Vitals/Pain Today's Vitals   01/08/23 0630 01/08/23 0747 01/08/23 0930 01/08/23 0950  BP: 138/74  133/84   Pulse: 77  87   Resp: 20  (!) 27   Temp:    97.9 F (36.6 C)  TempSrc:    Oral  SpO2: 96% 95% 93%   Weight:      Height:      PainSc:        Isolation Precautions Airborne and Contact precautions  Medications Medications  cefTRIAXone (ROCEPHIN) 2 g in sodium chloride 0.9 % 100 mL IVPB (0 g Intravenous Stopped 01/07/23 1545)  azithromycin (ZITHROMAX) 500 mg in sodium chloride 0.9 % 250 mL IVPB (0 mg Intravenous Stopped 01/07/23 1918)  0.45 % NaCl with KCl 20 mEq / L infusion ( Intravenous Infusion Verify 01/08/23 0740)  albuterol (PROVENTIL) (2.5 MG/3ML) 0.083% nebulizer solution 2.5 mg (has no administration in time range)  apixaban (ELIQUIS) tablet 5 mg (5 mg Oral Given 01/08/23 1058)  baclofen (LIORESAL) tablet 10-20 mg (20 mg Oral Given 01/08/23 1058)  mometasone-formoterol (DULERA) 100-5 MCG/ACT inhaler 2 puff (2 puffs Inhalation Given 01/08/23 0747)  carbidopa-levodopa (SINEMET CR) 50-200 MG per tablet controlled release 1 tablet (1 tablet Oral Not  Given 01/08/23 0225)  loratadine (CLARITIN) tablet 10 mg (10 mg Oral Given 01/08/23 1057)  lisinopril (ZESTRIL) tablet 2.5 mg (2.5 mg Oral Given 01/08/23 1057)  metoprolol tartrate (LOPRESSOR) tablet 12.5 mg (12.5 mg Oral Given 01/08/23 1056)  pantoprazole (PROTONIX) EC tablet 40 mg (40 mg Oral Given 01/08/23 1058)  sertraline (ZOLOFT) tablet 100 mg (100 mg Oral Given 01/08/23 0824)  tamsulosin (FLOMAX) capsule 0.4 mg (0.4 mg Oral Not Given 01/08/23 0225)  acetaminophen (TYLENOL) tablet 650 mg (has no administration in time range)    Or  acetaminophen (TYLENOL) suppository 650 mg (has no administration in time range)  ondansetron (ZOFRAN) tablet 4 mg (has no administration in time range)    Or  ondansetron (ZOFRAN) injection 4 mg (has no administration in time range)  polyethylene glycol (MIRALAX / GLYCOLAX) packet 17 g (has no administration in time range)  methylPREDNISolone sodium succinate (SOLU-MEDROL) 40 mg/mL injection 32.8 mg (32.8 mg Intravenous Given 01/08/23 0900)    Followed by  predniSONE (DELTASONE) tablet 50 mg (has no administration in time range)  insulin detemir (LEVEMIR) injection 6 Units (6 Units Subcutaneous Given 01/08/23 1058)  insulin aspart (novoLOG) injection 0-15 Units (3 Units Subcutaneous Given 01/08/23  0900)  baricitinib (OLUMIANT) tablet 4 mg (4 mg Oral Given 01/08/23 1059)  remdesivir 100 mg in sodium chloride 0.9 % 100 mL IVPB (0 mg Intravenous Stopped 01/08/23 0310)    Followed by  remdesivir 100 mg in sodium chloride 0.9 % 100 mL IVPB (0 mg Intravenous Stopped 01/08/23 0351)    Followed by  remdesivir 100 mg in sodium chloride 0.9 % 100 mL IVPB (has no administration in time range)  sodium chloride 0.9 % bolus 1,000 mL (0 mLs Intravenous Stopped 01/07/23 1918)    And  sodium chloride 0.9 % bolus 1,000 mL (0 mLs Intravenous Stopped 01/07/23 1918)  acetaminophen (TYLENOL) tablet 650 mg (650 mg Oral Given 01/07/23 1551)  methylPREDNISolone sodium succinate  (SOLU-MEDROL) 125 mg/2 mL injection 80 mg (80 mg Intravenous Given 01/07/23 2144)  amiodarone (NEXTERONE) 1.5 mg/mL IV bolus only 150 mg (0 mg Intravenous Stopped 01/08/23 0107)    Mobility non-ambulatory     Focused Assessments Pulmonary Assessment Handoff:  Lung sounds: Bilateral Breath Sounds: Diminished O2 Device: High Flow Nasal Cannula O2 Flow Rate (L/min): 10 L/min    R Recommendations: See Admitting Provider Note  Report given to:   Additional Notes: Attempting to decrease O2 as patient condition allows. Pt has trouble feeding hisself and taking meds. Purewick in place.

## 2023-01-09 DIAGNOSIS — J9621 Acute and chronic respiratory failure with hypoxia: Secondary | ICD-10-CM | POA: Diagnosis not present

## 2023-01-09 DIAGNOSIS — U071 COVID-19: Secondary | ICD-10-CM | POA: Diagnosis not present

## 2023-01-09 DIAGNOSIS — G9341 Metabolic encephalopathy: Secondary | ICD-10-CM | POA: Diagnosis not present

## 2023-01-09 DIAGNOSIS — I10 Essential (primary) hypertension: Secondary | ICD-10-CM | POA: Diagnosis not present

## 2023-01-09 DIAGNOSIS — E43 Unspecified severe protein-calorie malnutrition: Secondary | ICD-10-CM | POA: Insufficient documentation

## 2023-01-09 LAB — D-DIMER, QUANTITATIVE: D-Dimer, Quant: 0.94 ug{FEU}/mL — ABNORMAL HIGH (ref 0.00–0.50)

## 2023-01-09 LAB — LACTATE DEHYDROGENASE: LDH: 192 U/L (ref 98–192)

## 2023-01-09 LAB — GLUCOSE, CAPILLARY
Glucose-Capillary: 129 mg/dL — ABNORMAL HIGH (ref 70–99)
Glucose-Capillary: 137 mg/dL — ABNORMAL HIGH (ref 70–99)
Glucose-Capillary: 149 mg/dL — ABNORMAL HIGH (ref 70–99)
Glucose-Capillary: 182 mg/dL — ABNORMAL HIGH (ref 70–99)
Glucose-Capillary: 188 mg/dL — ABNORMAL HIGH (ref 70–99)
Glucose-Capillary: 192 mg/dL — ABNORMAL HIGH (ref 70–99)
Glucose-Capillary: 212 mg/dL — ABNORMAL HIGH (ref 70–99)

## 2023-01-09 LAB — C-REACTIVE PROTEIN: CRP: 18.7 mg/dL — ABNORMAL HIGH (ref <1.0)

## 2023-01-09 LAB — FERRITIN: Ferritin: 505 ng/mL — ABNORMAL HIGH (ref 24–336)

## 2023-01-09 MED ORDER — BOOST / RESOURCE BREEZE PO LIQD CUSTOM
1.0000 | Freq: Three times a day (TID) | ORAL | Status: DC
  Administered 2023-01-09: 1 via ORAL

## 2023-01-09 MED ORDER — ADULT MULTIVITAMIN W/MINERALS CH
1.0000 | ORAL_TABLET | Freq: Every day | ORAL | Status: DC
  Administered 2023-01-09 – 2023-01-10 (×2): 1 via ORAL
  Filled 2023-01-09 (×2): qty 1

## 2023-01-09 NOTE — TOC Progression Note (Signed)
Transition of Care [facility]) - Progression Note    Patient Details  Name: Aaron Leon MRN: [id number] Date of Birth: 1950/12/01  [facility] CM/SW Contact  Elliot Gault, LCSW Phone Number: 01/09/2023, 1:02 PM  Clinical Narrative:     TOC following. MD anticipating dc tomorrow. PT recommending HH at dc. Spoke with pt/wife to review. They are in agreement with HHPT at dc and request [facility].  Referral made. Will follow.  Expected Discharge Plan: Home w Home Health Services Barriers to Discharge: Continued Medical Work up  Expected Discharge Plan and Services In-house Referral: Clinical Social Work Discharge Planning Services: CM Consult Post Acute Care Choice: Home Health Living arrangements for the past 2 months: Single Family Home                                       Social Determinants of Health (SDOH) Interventions SDOH Screenings   Food Insecurity: No Food Insecurity (01/08/2023)  Housing: Low Risk  (01/08/2023)  Transportation Needs: No Transportation Needs (01/08/2023)  Utilities: Not At Risk (01/08/2023)  Financial Resource Strain: Medium Risk (06/05/2022)   Received from [facility], [facility]  Social Connections: Unknown (08/03/2021)   Received from [facility], [facility]  Stress: No Stress Concern Present (06/01/2022)   Received from [facility], [facility]  Tobacco Use: Medium Risk (01/07/2023)    Readmission Risk Interventions    01/08/2023    2:01 PM 07/28/2022    5:22 PM  Readmission Risk Prevention Plan  Transportation Screening Complete Complete  PCP or Specialist Appt within 3-5 Days  Complete  HRI or Home Care Consult  Complete  Social Work Consult for Recovery Care Planning/Counseling  Complete  Palliative Care Screening  Not Applicable  Medication Review Oceanographer) Complete Complete  HRI or Home Care Consult Complete   SW Recovery Care/Counseling Consult Complete   Palliative Care Screening Not  Applicable   Skilled Nursing Facility Not Applicable

## 2023-01-09 NOTE — Progress Notes (Signed)
[facility]                                                                               Aaron Leon, is a 72 y.o. male, DOB - [birth year], RJJ:[id number] Admit date - 01/07/2023    Outpatient Primary MD for the patient is [facility] - [facility]  days    Brief summary   Aaron Leon is a 72 y.o. male with medical history significant for Pulmonary embolism, CVA, HTN, DM, Parkinson's, dementia, COPD with chronic respiratory failure on 3 L.  He has had chronic intermittent episodes of coughing while eating, per spouse he has had many swallow evaluations-deemed normal, and he is on a normal diet.  Chest x-ray shows bibasilar airspace disease suggest recurrent pneumonia or aspiration. Pelvic x-ray, head CT, cervical CT unremarkable. IV Ceftriaxone and azithromycin given.    Assessment & Plan    Assessment and Plan: * Acute on chronic respiratory failure with hypoxia (HCC) Patient is on 3 L of nasal cannula oxygen at baseline  Initially required about 10 lit, weaned down to 6 lit/min yesterday and down to 4 lit/min today.   Acute respiratory failure probably secondary to pneumonia and COVID infection  Acute metabolic encephalopathy Presenting with confusion, fall, lethargy. Likely due to severe sepsis, pneumonia, dehydration with underlying dementia. Head Cervical CT negative for acute abnormality. He is more alert and answering questions appropriately. Wants to know when he can go home.    Severe sepsis (HCC) Meets severe sepsis criteria with tachycardia heart rate 125-136, fever of 100.8, hypotensive with blood pressure of 86/69 mm hg, leukocytosis of 11.6, tachypnea- resp rate  21-28, hypoxia oxygen sats around 85% on 3 L of nasal cannula oxygen,  with evidence of endorgan dysfunction, respiratory failure, and lactic acidosis of 2.1 > 1.7 and encephalopathy.   Probably secondary to pneumonia. Sepsis physiology improving.   Afebrile. Negative cultures so  far.  Continue to follow-up blood cultures.    Pneumonia due to COVID-19 virus Pneumonia with acute on chronic hypoxic respiratory failure and severe sepsis.  COVID test positive, likely with superimposed pneumonia considering chest x-ray findings suggesting - recurrent pneumonia or aspiration pneumonia.   Chronic and unchanged swallowing problems. Improving inflammatory markers.  SLP evaluations are pending.  Continue with IV Rocephin and Zithromax.   Continue with baricitinib and steroids   Pulmonary embolism (HCC) History of PE.  Resume Eliquis  Dementia with behavioral disturbance (HCC) Baseline dementia, and Parkinson's disease. -Resume Sinemet No behavioral abnormalities.   Essential hypertension Optimal BP parameters.    Diabetes mellitus (HCC)/ insulin dependent. Well controlled CBG'S. CBG (last 3)  Recent Labs    01/09/23 0404 01/09/23 0739 01/09/23 1140  GLUCAP 137* 129* 212*   Continue with sliding scale insulin and Semglee  A1c IS 7,      RN Pressure Injury Documentation: Pressure Injury 07/27/22 Heel Right Deep Tissue Pressure Injury - Purple or maroon localized area of discolored intact skin or blood-filled blister due to damage of underlying soft tissue from pressure and/or shear. (Active)  07/27/22 2000  Location: Heel  Location Orientation: Right  Staging: Deep Tissue Pressure Injury - Purple or  maroon localized area of discolored intact skin or blood-filled blister due to damage of underlying soft tissue from pressure and/or shear.  Wound Description (Comments):   Present on Admission:      Estimated body mass index is 19.67 kg/m as calculated from the following:   Height as of this encounter: 6' (1.829 m).   Weight as of this encounter: 65.8 kg.  Code Status: Full code DVT Prophylaxis:   apixaban (ELIQUIS) tablet 5 mg   Level of Care: Level of care: Telemetry Family Communication: Family at bedside  Disposition Plan:     Remains  inpatient appropriate: IV antibiotics and IV steroids  Procedures:  None.   Consultants:   None  Antimicrobials:   Anti-infectives (From admission, onward)    Start     Dose/Rate Route Frequency Ordered Stop   01/09/23 1000  remdesivir 100 mg in sodium chloride 0.9 % 100 mL IVPB  Status:  Discontinued       Placed in "Followed by" Linked Group   100 mg 200 mL/hr over 30 Minutes Intravenous Daily 01/08/23 0109 01/08/23 0116   01/09/23 1000  remdesivir 100 mg in sodium chloride 0.9 % 100 mL IVPB  Status:  Discontinued       Placed in "Followed by" Linked Group   100 mg 200 mL/hr over 30 Minutes Intravenous Daily 01/08/23 0116 01/08/23 1221   01/08/23 0200  remdesivir 100 mg in sodium chloride 0.9 % 100 mL IVPB       Placed in "Followed by" Linked Group   100 mg 200 mL/hr over 30 Minutes Intravenous  Once 01/08/23 0116 01/08/23 0351   01/08/23 0130  remdesivir 100 mg in sodium chloride 0.9 % 100 mL IVPB       Placed in "Followed by" Linked Group   100 mg 200 mL/hr over 30 Minutes Intravenous  Once 01/08/23 0116 01/08/23 0310   01/08/23 0115  remdesivir 200 mg in sodium chloride 0.9% 250 mL IVPB  Status:  Discontinued       Placed in "Followed by" Linked Group   200 mg 580 mL/hr over 30 Minutes Intravenous Once 01/08/23 0109 01/08/23 0116   01/07/23 1445  cefTRIAXone (ROCEPHIN) 2 g in sodium chloride 0.9 % 100 mL IVPB        2 g 200 mL/hr over 30 Minutes Intravenous Every 24 hours 01/07/23 1439 01/12/23 1444   01/07/23 1445  azithromycin (ZITHROMAX) 500 mg in sodium chloride 0.9 % 250 mL IVPB        500 mg 250 mL/hr over 60 Minutes Intravenous Every 24 hours 01/07/23 1439 01/12/23 1444        Medications  Scheduled Meds:  apixaban  5 mg Oral BID   baclofen  10-20 mg Oral Daily   baricitinib  4 mg Oral Daily   carbidopa-levodopa  1 tablet Oral QHS   feeding supplement  1 Container Oral TID BM   insulin aspart  0-15 Units Subcutaneous Q4H   insulin detemir  6 Units  Subcutaneous q morning   lisinopril  2.5 mg Oral Daily   loratadine  10 mg Oral Daily   methylPREDNISolone (SOLU-MEDROL) injection  0.5 mg/kg Intravenous Q12H   Followed by   Melene Muller ON 01/11/2023] predniSONE  50 mg Oral Daily   metoprolol tartrate  12.5 mg Oral BID   mometasone-formoterol  2 puff Inhalation BID   multivitamin with minerals  1 tablet Oral Daily   pantoprazole  40 mg Oral Daily   sertraline  100  mg Oral q AM   tamsulosin  0.4 mg Oral QHS   Continuous Infusions:  azithromycin 500 mg (01/08/23 1559)   cefTRIAXone (ROCEPHIN)  IV Stopped (01/08/23 1559)   PRN Meds:.acetaminophen **OR** acetaminophen, albuterol, melatonin, ondansetron **OR** ondansetron (ZOFRAN) IV, oxyCODONE, polyethylene glycol, risperiDONE    Subjective:   Atreya Terhaar was seen and examined today.  Reports feeling better. Wants to knw when he can go home.   Objective:   Vitals:   01/08/23 2117 01/09/23 0403 01/09/23 0742 01/09/23 1333  BP: (!) 165/75 (!) 130/57  (!) 148/73  Pulse: 60 (!) 108  (!) 51  Resp:  18  20  Temp:  98.9 F (37.2 C)  (!) 96.6 F (35.9 C)  TempSrc: Oral   Oral  SpO2: 96% 92% 93% 96%  Weight:      Height:        Intake/Output Summary (Last 24 hours) at 01/09/2023 1414 Last data filed at 01/09/2023 1300 Gross per 24 hour  Intake 1113.67 ml  Output 1050 ml  Net 63.67 ml   Filed Weights   01/07/23 1414  Weight: 65.8 kg     Exam General exam: Appears calm and comfortable  Respiratory system: Clear to auscultation. Respiratory effort normal. Cardiovascular system: S1 & S2 heard, RRR. No JVD,  Gastrointestinal system: Abdomen is nondistended, soft and nontender.  Central nervous system: Alert and oriented.  Extremities: Symmetric 5 x 5 power. Skin: No rashes Psychiatry:  Mood & affect appropriate.     Data Reviewed:  I have personally reviewed following labs and imaging studies   CBC Lab Results  Component Value Date   WBC 5.4 01/08/2023   RBC 4.18  (L) 01/08/2023   HGB 11.3 (L) 01/08/2023   HCT 37.3 (L) 01/08/2023   MCV 89.2 01/08/2023   MCH 27.0 01/08/2023   PLT 245 01/08/2023   MCHC 30.3 01/08/2023   RDW 17.0 (H) 01/08/2023   LYMPHSABS 0.6 (L) 01/07/2023   MONOABS 0.6 01/07/2023   EOSABS 0.0 01/07/2023   BASOSABS 0.0 01/07/2023     Last metabolic panel Lab Results  Component Value Date   NA 142 01/08/2023   K 3.6 01/08/2023   CL 107 01/08/2023   CO2 27 01/08/2023   BUN 32 (H) 01/08/2023   CREATININE 0.89 01/08/2023   GLUCOSE 146 (H) 01/08/2023   GFRNONAA >60 01/08/2023   GFRAA  03/21/2008    >60        The eGFR has been calculated using the MDRD equation. This calculation has not been validated in all clinical   CALCIUM 7.9 (L) 01/08/2023   PHOS 3.0 06/17/2022   PROT 7.6 01/07/2023   ALBUMIN 3.2 (L) 01/07/2023   BILITOT 1.0 01/07/2023   ALKPHOS 93 01/07/2023   AST 14 (L) 01/07/2023   ALT 13 01/07/2023   ANIONGAP 8 01/08/2023    CBG (last 3)  Recent Labs    01/09/23 0404 01/09/23 0739 01/09/23 1140  GLUCAP 137* 129* 212*      Coagulation Profile: Recent Labs  Lab 01/07/23 1427  INR 1.3*     Radiology Studies: CT Head Wo Contrast  Result Date: 01/07/2023 CLINICAL DATA:  Head trauma, minor (Age >= 65y); Neck trauma (Age >= 65y) EXAM: CT HEAD WITHOUT CONTRAST CT CERVICAL SPINE WITHOUT CONTRAST TECHNIQUE: Multidetector CT imaging of the head and cervical spine was performed following the standard protocol without intravenous contrast. Multiplanar CT image reconstructions of the cervical spine were also generated. RADIATION DOSE REDUCTION: This exam  was performed according to the departmental dose-optimization program which includes automated exposure control, adjustment of the mA and/or kV according to patient size and/or use of iterative reconstruction technique. COMPARISON:  None Available. FINDINGS: CT HEAD FINDINGS Brain: No evidence of acute infarction, hemorrhage, hydrocephalus, extra-axial  collection or mass lesion/mass effect. Vascular: Calcific atherosclerosis. Skull: No acute fracture. Sinuses/Orbits: Sinuses.  No acute orbital findings. Other: No mastoid effusions CT CERVICAL SPINE FINDINGS Alignment: No substantial sagittal subluxation. Skull base and vertebrae: No acute fracture. Similar chronic height loss at C7. Vertebral heights are maintained. Osteopenia. C5-T1 ACDF. Soft tissues and spinal canal: No prevertebral fluid or swelling. No visible canal hematoma. Disc levels: Multilevel bony degenerative change including facet and uncovertebral hypertrophy and disc height loss. Upper chest: Emphysema. IMPRESSION: No evidence of acute abnormality intracranially or in the cervical spine. Electronically Signed   By: Feliberto Harts M.D.   On: 01/07/2023 18:19   CT Cervical Spine Wo Contrast  Result Date: 01/07/2023 CLINICAL DATA:  Head trauma, minor (Age >= 65y); Neck trauma (Age >= 65y) EXAM: CT HEAD WITHOUT CONTRAST CT CERVICAL SPINE WITHOUT CONTRAST TECHNIQUE: Multidetector CT imaging of the head and cervical spine was performed following the standard protocol without intravenous contrast. Multiplanar CT image reconstructions of the cervical spine were also generated. RADIATION DOSE REDUCTION: This exam was performed according to the departmental dose-optimization program which includes automated exposure control, adjustment of the mA and/or kV according to patient size and/or use of iterative reconstruction technique. COMPARISON:  None Available. FINDINGS: CT HEAD FINDINGS Brain: No evidence of acute infarction, hemorrhage, hydrocephalus, extra-axial collection or mass lesion/mass effect. Vascular: Calcific atherosclerosis. Skull: No acute fracture. Sinuses/Orbits: Sinuses.  No acute orbital findings. Other: No mastoid effusions CT CERVICAL SPINE FINDINGS Alignment: No substantial sagittal subluxation. Skull base and vertebrae: No acute fracture. Similar chronic height loss at C7.  Vertebral heights are maintained. Osteopenia. C5-T1 ACDF. Soft tissues and spinal canal: No prevertebral fluid or swelling. No visible canal hematoma. Disc levels: Multilevel bony degenerative change including facet and uncovertebral hypertrophy and disc height loss. Upper chest: Emphysema. IMPRESSION: No evidence of acute abnormality intracranially or in the cervical spine. Electronically Signed   By: Feliberto Harts M.D.   On: 01/07/2023 18:19   DG Pelvis 1-2 Views  Result Date: 01/07/2023 CLINICAL DATA:  Larey Seat several days ago. EXAM: PELVIS - 1-2 VIEW COMPARISON:  CT 06/17/2022 FINDINGS: The patient is rotated towards the right. The bones are osteopenic, limiting sensitivity. I do not see evidence of regional fracture. Previous hip arthroplasty on the right. Previous ORIF of intertrochanteric fracture on the left. Previous lower lumbar fusion. IMPRESSION: No acute or traumatic finding. Previous right hip arthroplasty. Previous ORIF of intertrochanteric fracture on the left. Because of osteopenia, rotation and overlying gas an artifact, if suspicion is high, consider pelvic CT. Otherwise, these films are interpreted as negative. Electronically Signed   By: Paulina Fusi M.D.   On: 01/07/2023 17:49   DG Chest Portable 1 View  Result Date: 01/07/2023 CLINICAL DATA:  Sepsis EXAM: PORTABLE CHEST 1 VIEW COMPARISON:  07/27/2022 plain film and CT FINDINGS: Osteopenia. Old right rib fractures. Midline trachea. Mild cardiomegaly. Atherosclerosis in the transverse aorta. Mild right hemidiaphragm elevation. No pleural effusion or pneumothorax. The upper lungs are clear. Bibasilar airspace disease is similar on the left and increased on the right. IMPRESSION: Bibasilar airspace disease, similar on the left and new on the right. Favor recurrent pneumonia or aspiration. Cardiomegaly without congestive failure. Aortic Atherosclerosis (ICD10-I70.0). Electronically  Signed   By: Jeronimo Greaves M.D.   On: 01/07/2023 17:18        Kathlen Mody M.D. [facility] 01/09/2023, 2:14 PM  Available via Epic secure chat 7am-7pm After 7 pm, please refer to night coverage provider listed on amion.

## 2023-01-09 NOTE — Plan of Care (Signed)
  Problem: Acute Rehab PT Goals(only PT should resolve) Goal: Pt will Roll Supine to Side Outcome: Progressing Flowsheets (Taken 01/09/2023 1525) Pt will Roll Supine to Side: with min assist Goal: Pt Will Go Supine/Side To Sit Outcome: Progressing Flowsheets (Taken 01/09/2023 1525) Pt will go Supine/Side to Sit: with minimal assist Goal: Pt Will Go Sit To Supine/Side Outcome: Progressing Flowsheets (Taken 01/09/2023 1525) Pt will go Sit to Supine/Side:  with minimal assist  with moderate assist Goal: Patient Will Transfer Sit To/From Stand Outcome: Progressing Flowsheets (Taken 01/09/2023 1525) Patient will transfer sit to/from stand:  with moderate assist  with minimal assist Goal: Pt Will Transfer Bed To Chair/Chair To Bed Outcome: Progressing   3:29 PM, 01/09/23 Ocie Bob, MPT Physical Therapist with [facility] [phone] office [phone] mobile phone

## 2023-01-09 NOTE — Progress Notes (Signed)
Initial Nutrition Assessment  DOCUMENTATION CODES:   Severe malnutrition in context of chronic illness  INTERVENTION:   Boost Breeze po TID, each supplement provides 250 kcal and 9 grams of protein Magic cup TID with meals, each supplement provides 290 kcal and 9 grams of protein MVI with minerals daily Tailor meals to patient preferences (no fish)  NUTRITION DIAGNOSIS:   Severe Malnutrition related to chronic illness (COPD) as evidenced by severe muscle depletion, severe fat depletion.  GOAL:   Patient will meet greater than or equal to 90% of their needs  MONITOR:   PO intake, Supplement acceptance  REASON FOR ASSESSMENT:   Malnutrition Screening Tool    ASSESSMENT:   72 yo male admitted with acute on chronic respiratory failure d/t COVID infection, severe sepsis, PNA. PMH includes CVA, HTN, DM, Parkinson's disease, dementia, COPD on 3 L oxygen at baseline, pulmonary embolism.  Spoke with patient and his wife at bedside. Patient has lost a lot of weight since the beginning of the year. He has had multiple illnesses and hospitalizations this year. Per her report, he weighed 198 lbs in January, now down to 140's. Weight encounters show he was 155 lbs in March 2024, currently 145 lbs. He does not like milk or any milky supplements. He is allergic to shellfish and doesn't eat any fish or seafood. He likes ice cream. Agreed to try magic cups with meals and Boost Breeze between meals.    Labs reviewed.  CBG: 137-129  Medications reviewed and include novolog, levemir, solumedrol, flomax, IV antibiotics.  Weight history reviewed. If weight encounters are correct, patient has had 6.5% weight loss within the past 7 months, which is concerning, but not significant for the time frame. Patient meets criteria for severe malnutrition, given severe depletion of muscle and subcutaneous fat mass.  NUTRITION - FOCUSED PHYSICAL EXAM:  Flowsheet Row Most Recent Value  Orbital Region  Moderate depletion  Upper Arm Region Severe depletion  Thoracic and Lumbar Region Severe depletion  Buccal Region Moderate depletion  Temple Region Severe depletion  Clavicle Bone Region Severe depletion  Clavicle and Acromion Bone Region Severe depletion  Scapular Bone Region Moderate depletion  Dorsal Hand Moderate depletion  Patellar Region Unable to assess  Anterior Thigh Region Unable to assess  Posterior Calf Region Severe depletion  Edema (RD Assessment) None  Hair Reviewed  Eyes Reviewed  Mouth Reviewed  Skin Reviewed  Nails Reviewed       Diet Order:   Diet Order             Diet heart healthy/carb modified Fluid consistency: Thin  Diet effective now                   EDUCATION NEEDS:   Education needs have been addressed  Skin:  Skin Assessment: Reviewed RN Assessment  Last BM:  unknown  Height:   Ht Readings from Last 1 Encounters:  01/07/23 6' (1.829 m)    Weight:   Wt Readings from Last 1 Encounters:  01/07/23 65.8 kg    Ideal Body Weight:  80.9 kg  BMI:  Body mass index is 19.67 kg/m.  Estimated Nutritional Needs:   Kcal:  2000-2200  Protein:  100-115 gm  Fluid:  2-2.2 L   Gabriel Rainwater RD, LDN, CNSC Please refer to Amion for contact information.

## 2023-01-09 NOTE — Plan of Care (Signed)
  Problem: Metabolic: Goal: Ability to maintain appropriate glucose levels will improve Outcome: Progressing   Problem: Nutritional: Goal: Maintenance of adequate nutrition will improve Outcome: Progressing   Problem: Respiratory: Goal: Will maintain a patent airway Outcome: Progressing   Problem: Respiratory: Goal: Complications related to the disease process, condition or treatment will be avoided or minimized Outcome: Progressing

## 2023-01-09 NOTE — Evaluation (Signed)
Physical Therapy Evaluation Patient Details Name: Aaron Leon MRN: [id number] DOB: 1950/10/10 Today's Date: 01/09/2023  History of Present Illness  Aaron Leon is a 72 y.o. male with medical history significant for Pulmonary embolism, CVA, HTN, DM, Parkinson's, dementia, COPD with chronic respiratory failure on 3 L.   Patient was brought to the ED reports of confusion, not talking, and fall.  At baseline he has dementia, with mostly short-term memory loss and intermittent confused speech, and ambulates with an electric wheelchair but he is able to answer simple questions.  At the time of my evaluation patient is awake, follows some directions, but not answering questions, history is obtained from spouse at bedside.  She reports patient fell 3 days ago, the next day he was weak and speech started getting confused.  No cough no difficulty breathing.  Urinary symptoms.  Vomiting no diarrhea.  He has had poor oral intake for the past couple of days.  He has had chronic intermittent episodes of coughing while eating, per spouse he has had many swallow evaluations-deemed normal, and he is on a normal diet.  He had COVID when he was in the nursing home about 3 years ago, he did not require hospitalization.   Clinical Impression  Patient demonstrates slow labored movement for sitting up at bedside with c/o severe pain with pressure to legs and upper back, apprehensive to transfer to chair, but agreeable after motivated by his daughter.  Patient's daughter demonstrates good return for transferring patient from bed to chair bear hugging with patient's arms holding onto patient shoulders.  Patient tolerated sitting up in chair after therapy with his daughter present.  Patient will benefit from continued skilled physical therapy in hospital and recommended venue below to increase strength, balance, endurance for safe ADLs and gait.           If plan is discharge home, recommend the following: A lot of  help with bathing/dressing/bathroom;A lot of help with walking and/or transfers;Help with stairs or ramp for entrance;Assistance with cooking/housework   Can travel by private vehicle        Equipment Recommendations None recommended by PT  Recommendations for Other Services       Functional Status Assessment Patient has had a recent decline in their functional status and/or demonstrates limited ability to make significant improvements in function in a reasonable and predictable amount of time     Precautions / Restrictions Precautions Precautions: Fall Restrictions Weight Bearing Restrictions: No      Mobility  Bed Mobility Overal bed mobility: Needs Assistance Bed Mobility: Supine to Sit     Supine to sit: Mod assist, Max assist     General bed mobility comments: slow labored movement    Transfers Overall transfer level: Needs assistance Equipment used: 1 person hand held assist Transfers: Sit to/from Stand, Bed to chair/wheelchair/BSC Sit to Stand: Max assist   Step pivot transfers: Max assist       General transfer comment: Patient's daughter demonstrates good return for transferring patient from bed to chair bear hugging with patient's arms holding onto patient shoulders    Ambulation/Gait                  Stairs            Wheelchair Mobility     Tilt Bed    Modified Rankin (Stroke Patients Only)       Balance Overall balance assessment: Needs assistance Sitting-balance support: Feet supported, No upper extremity supported  Sitting balance-Leahy Scale: Fair Sitting balance - Comments: seated at EOB   Standing balance support: During functional activity, Bilateral upper extremity supported Standing balance-Leahy Scale: Poor Standing balance comment: hand held assist                             Pertinent Vitals/Pain Pain Assessment Pain Assessment: Faces Faces Pain Scale: Hurts little more Pain Location: pressure  to legs, upper back Pain Descriptors / Indicators: Sore, Grimacing, Discomfort Pain Intervention(s): Limited activity within patient's tolerance, Monitored during session, Repositioned    Home Living Family/patient expects to be discharged to:: Private residence Living Arrangements: Children;Spouse/significant other Available Help at Discharge: Family;Available 24 hours/day;Personal care attendant Type of Home: Mobile home Home Access: Ramped entrance       Home Layout: One level Home Equipment: Rolling Walker (2 wheels);BSC/3in1;Tub bench;Wheelchair - power;Hospital bed;Other (comment) Additional Comments: Aides avaialble 5x a week when wife is away.    Prior Function Prior Level of Function : Needs assist       Physical Assist : Mobility (physical);ADLs (physical) Mobility (physical): Bed mobility;Transfers;Gait;Stairs   Mobility Comments: Pivot transfers to chair with assist and PRN use of RW; uses power chair for mobility. ADLs Comments: Assisted for bathing, dressing, toileting, grooming, and IADL's.     Extremity/Trunk Assessment   Upper Extremity Assessment Upper Extremity Assessment: Defer to OT evaluation    Lower Extremity Assessment Lower Extremity Assessment: Generalized weakness;LLE deficits/detail LLE Deficits / Details: presents with contractures, excessive left hip adduction, grossly 3/5 LLE: Unable to fully assess due to immobilization LLE Sensation: decreased proprioception LLE Coordination: decreased gross motor;decreased fine motor    Cervical / Trunk Assessment Cervical / Trunk Assessment: Kyphotic  Communication   Communication Communication: Hearing impairment Cueing Techniques: Verbal cues;Tactile cues  Cognition Arousal: Alert Behavior During Therapy: WFL for tasks assessed/performed Overall Cognitive Status: Within Functional Limits for tasks assessed                                          General Comments       Exercises     Assessment/Plan    PT Assessment Patient needs continued PT services  PT Problem List Decreased strength;Decreased activity tolerance;Decreased balance;Decreased mobility       PT Treatment Interventions DME instruction;Functional mobility training;Therapeutic activities;Therapeutic exercise;Balance training;Patient/family education    PT Goals (Current goals can be found in the Care Plan section)  Acute Rehab PT Goals Patient Stated Goal: return home with family to assist PT Goal Formulation: With patient/family Time For Goal Achievement: 01/16/23 Potential to Achieve Goals: Good    Frequency Min 3X/week     Co-evaluation               AM-PAC PT "6 Clicks" Mobility  Outcome Measure Help needed turning from your back to your side while in a flat bed without using bedrails?: A Lot Help needed moving from lying on your back to sitting on the side of a flat bed without using bedrails?: A Lot Help needed moving to and from a bed to a chair (including a wheelchair)?: A Lot Help needed standing up from a chair using your arms (e.g., wheelchair or bedside chair)?: A Lot Help needed to walk in hospital room?: Total Help needed climbing 3-5 steps with a railing? : Total 6 Click Score: 10  End of Session Equipment Utilized During Treatment: Oxygen Activity Tolerance: Patient tolerated treatment well;Patient limited by fatigue Patient left: in chair;with call bell/phone within reach;with family/visitor present Nurse Communication: Mobility status PT Visit Diagnosis: Unsteadiness on feet (R26.81);Other abnormalities of gait and mobility (R26.89);Muscle weakness (generalized) (M62.81)    Time: 9323-5573 PT Time Calculation (min) (ACUTE ONLY): 27 min   Charges:   PT Evaluation $PT Eval Moderate Complexity: 1 Mod PT Treatments $Therapeutic Activity: 23-37 mins PT General Charges $$ ACUTE PT VISIT: 1 Visit         3:23 PM, 01/09/23 Ocie Bob,  MPT Physical Therapist with [facility] 336 [phone] office [phone] mobile phone

## 2023-01-09 NOTE — Evaluation (Signed)
Clinical/Bedside Swallow Evaluation Patient Details  Name: Aaron Leon MRN: [id number] Date of Birth: 05-30-1950  Today's Date: 01/09/2023 Time: SLP Start Time (ACUTE ONLY): 1525 SLP Stop Time (ACUTE ONLY): 1548 SLP Time Calculation (min) (ACUTE ONLY): 23 min  Past Medical History:  Past Medical History:  Diagnosis Date   COPD (chronic obstructive pulmonary disease) (HCC)    Diabetes mellitus without complication (HCC)    "type 2"   H/O septic shock    Hypertension    Pulmonary embolism (HCC)    Stroke [facility])    Past Surgical History:  Past Surgical History:  Procedure Laterality Date   BACK SURGERY     CATARACT EXTRACTION     CERVICAL FUSION     HIP PINNING Left    03/2022   KNEE SURGERY     TOTAL HIP ARTHROPLASTY Right 02/2021   HPI:  Aaron Leon is a 73 y.o. male with medical history significant for Pulmonary embolism, CVA, HTN, DM, Parkinson's, dementia, COPD with chronic respiratory failure on 3 L.  He has had chronic intermittent episodes of coughing while eating, per spouse he has had many swallow evaluations-deemed normal, and he is on a normal diet.   Chest x-ray shows bibasilar airspace disease suggest recurrent pneumonia or aspiration.  Pelvic x-ray, head CT, cervical CT unremarkable.  IV Ceftriaxone and azithromycin given.  BSE requested.   MBSS July 2024: <<Clinical Impression: Patient presents with a mild oropharyngeal dysphagia characterized by decreased strength and efficiency with mastication of solids and slightly slow anterior to posterior transit in oral cavity. During pharyngeal phase, anterior hyoid excursion, laryngeal elevation and epiglottic inversion were all complete. Swallow initiation occured at level of vallecular sinus with solids and thick liquids and occured at level of pyriform sinus with thin liquids. No penetration or aspiration observed with any of the tested consistencies, even when drinking larger cup sips and straw sips of thin  liquids. 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 This, in addition to posterior and anterior cervical fusion hardware, appears to result in slow but mostly complete transit of boluses through upper esoophagus. Trace amount of vallecular, pyriform and esophageal residuals remain after initial swallows. Radiology PA present who reported that 13mm barium tablet become stuck in distal esophagus just above GE junction. SLP is not recommnending any changes to patient's PO consistency as he is already eating softer foods. Recommendation is for small bites of solids, not eating too quickly and avoiding foods that are difficult to masticate.>>   Assessment / Plan / Recommendation  Clinical Impression  Clinical swallow evaluation completed at bedside with wife present. Pt had MBSS in July 2024 (see results above). Pt's wife indicates that he periodically gets "choked" when eating/drinking and doesn't seem to be any rhyme or reason for it. Pt consumed ice chips, thin water via cup/straw, puree, and self presented graham crackers without overt signs of symptoms of aspiration and no reports of globus. He was also observed taking his medication whole with water without incident. Pt has Parkinson's and he was reminded to be deliberate when eating and drinking (increase focus to task) and also with his speech. He is at risk for episodic "choking" episodes given PD, h/o stroke, and dementia. Recommend regular diet with thin liquids, PO medication whole with water, and Pt to be seated upright for all eating/drinking. No further SLP services indicated at this time. Pt and spouse are in agreement with plan of care.  SLP Visit Diagnosis: Dysphagia, unspecified (R13.10)  Aspiration Risk  Mild aspiration risk    Diet Recommendation Regular;Thin liquid    Liquid Administration via: Cup;Straw Medication Administration: Whole meds with liquid Supervision: Staff to assist with self feeding;Full  supervision/cueing for compensatory strategies Compensations: Slow rate;Small sips/bites Postural Changes: Seated upright at 90 degrees;Remain upright for at least 30 minutes after po intake    Other  Recommendations Oral Care Recommendations: Oral care BID;Staff/trained caregiver to provide oral care    Recommendations for follow up therapy are one component of a multi-disciplinary discharge planning process, led by the attending physician.  Recommendations may be updated based on patient status, additional functional criteria and insurance authorization.  Follow up Recommendations Follow physician's recommendations for discharge plan and follow up therapies      Assistance Recommended at Discharge    Functional Status Assessment Patient has not had a recent decline in their functional status  Frequency and Duration            Prognosis Prognosis for improved oropharyngeal function: Good      Swallow Study   General Date of Onset: 01/07/23 HPI: Aaron Leon is a 72 y.o. male with medical history significant for Pulmonary embolism, CVA, HTN, DM, Parkinson's, dementia, COPD with chronic respiratory failure on 3 L.  He has had chronic intermittent episodes of coughing while eating, per spouse he has had many swallow evaluations-deemed normal, and he is on a normal diet.   Chest x-ray shows bibasilar airspace disease suggest recurrent pneumonia or aspiration.  Pelvic x-ray, head CT, cervical CT unremarkable.  IV Ceftriaxone and azithromycin given.  BSE requested. Type of Study: Bedside Swallow Evaluation Previous Swallow Assessment: MBSS July 2024 Diet Prior to this Study: Regular;Thin liquids (Level 0) Temperature Spikes Noted: No Respiratory Status: Nasal cannula History of Recent Intubation: No Behavior/Cognition: Alert;Cooperative;Pleasant mood Oral Cavity Assessment: Within Functional Limits Oral Care Completed by SLP: Recent completion by staff Oral Cavity - Dentition:  Dentures, top;Dentures, bottom Vision: Functional for self-feeding Self-Feeding Abilities: Needs assist;Needs set up Patient Positioning: Upright in bed Baseline Vocal Quality: Normal;Breathy (Pt has PD) Volitional Cough: Strong;Congested Volitional Swallow: Able to elicit    Oral/Motor/Sensory Function Overall Oral Motor/Sensory Function: Within functional limits   Ice Chips Ice chips: Within functional limits Presentation: Spoon   Thin Liquid Thin Liquid: Within functional limits Presentation: Cup;Self Fed;Straw    Nectar Thick Nectar Thick Liquid: Not tested   Honey Thick Honey Thick Liquid: Not tested   Puree Puree: Within functional limits Presentation: Spoon   Solid     Solid: Impaired Presentation: Self Fed Oral Phase Functional Implications: Prolonged oral transit     Thank you,  Havery Moros, CCC-SLP [phone]  Ashmi Blas 01/09/2023,3:51 PM

## 2023-01-10 DIAGNOSIS — E43 Unspecified severe protein-calorie malnutrition: Secondary | ICD-10-CM

## 2023-01-10 DIAGNOSIS — U071 COVID-19: Secondary | ICD-10-CM | POA: Diagnosis not present

## 2023-01-10 DIAGNOSIS — J9621 Acute and chronic respiratory failure with hypoxia: Secondary | ICD-10-CM | POA: Diagnosis not present

## 2023-01-10 DIAGNOSIS — G9341 Metabolic encephalopathy: Secondary | ICD-10-CM | POA: Diagnosis not present

## 2023-01-10 DIAGNOSIS — I2699 Other pulmonary embolism without acute cor pulmonale: Secondary | ICD-10-CM | POA: Diagnosis not present

## 2023-01-10 LAB — CBC WITH DIFFERENTIAL/PLATELET
Abs Immature Granulocytes: 0.07 10*3/uL (ref 0.00–0.07)
Basophils Absolute: 0 10*3/uL (ref 0.0–0.1)
Basophils Relative: 0 %
Eosinophils Absolute: 0 10*3/uL (ref 0.0–0.5)
Eosinophils Relative: 0 %
HCT: 38 % — ABNORMAL LOW (ref 39.0–52.0)
Hemoglobin: 11.7 g/dL — ABNORMAL LOW (ref 13.0–17.0)
Immature Granulocytes: 1 %
Lymphocytes Relative: 14 %
Lymphs Abs: 0.9 10*3/uL (ref 0.7–4.0)
MCH: 26.4 pg (ref 26.0–34.0)
MCHC: 30.8 g/dL (ref 30.0–36.0)
MCV: 85.6 fL (ref 80.0–100.0)
Monocytes Absolute: 0.3 10*3/uL (ref 0.1–1.0)
Monocytes Relative: 5 %
Neutro Abs: 5.3 10*3/uL (ref 1.7–7.7)
Neutrophils Relative %: 80 %
Platelets: 268 10*3/uL (ref 150–400)
RBC: 4.44 MIL/uL (ref 4.22–5.81)
RDW: 15.8 % — ABNORMAL HIGH (ref 11.5–15.5)
WBC: 6.6 10*3/uL (ref 4.0–10.5)
nRBC: 0 % (ref 0.0–0.2)

## 2023-01-10 LAB — GLUCOSE, CAPILLARY
Glucose-Capillary: 142 mg/dL — ABNORMAL HIGH (ref 70–99)
Glucose-Capillary: 141 mg/dL — ABNORMAL HIGH (ref 70–99)
Glucose-Capillary: 283 mg/dL — ABNORMAL HIGH (ref 70–99)

## 2023-01-10 LAB — C-REACTIVE PROTEIN: CRP: 7.4 mg/dL — ABNORMAL HIGH (ref <1.0)

## 2023-01-10 LAB — BASIC METABOLIC PANEL
Anion gap: 8 (ref 5–15)
BUN: 27 mg/dL — ABNORMAL HIGH (ref 8–23)
CO2: 26 mmol/L (ref 22–32)
Calcium: 8.2 mg/dL — ABNORMAL LOW (ref 8.9–10.3)
Chloride: 104 mmol/L (ref 98–111)
Creatinine, Ser: 0.63 mg/dL (ref 0.61–1.24)
GFR, Estimated: >60 mL/min (ref >60)
Glucose, Bld: 148 mg/dL — ABNORMAL HIGH (ref 70–99)
Potassium: 3.4 mmol/L — ABNORMAL LOW (ref 3.5–5.1)
Sodium: 138 mmol/L (ref 135–145)

## 2023-01-10 MED ORDER — ADULT MULTIVITAMIN W/MINERALS CH: 1.0000 | ORAL_TABLET | Freq: Every day | ORAL | Status: DC

## 2023-01-10 MED ORDER — AMOXICILLIN-POT CLAVULANATE 875-125 MG PO TABS: 1.0000 | ORAL_TABLET | Freq: Two times a day (BID) | ORAL | 0 refills | Status: AC

## 2023-01-10 MED ORDER — PREDNISONE 50 MG PO TABS: 50.0000 mg | ORAL_TABLET | Freq: Every day | ORAL | 0 refills | Status: AC

## 2023-01-10 MED ORDER — POTASSIUM CHLORIDE CRYS ER 20 MEQ PO TBCR
40.0000 meq | EXTENDED_RELEASE_TABLET | Freq: Once | ORAL | Status: AC
  Administered 2023-01-10: 40 meq via ORAL

## 2023-01-10 MED ORDER — BARICITINIB 4 MG PO TABS: 4.0000 mg | ORAL_TABLET | Freq: Every day | ORAL | 0 refills | Status: DC

## 2023-01-10 NOTE — Progress Notes (Signed)
Pts states during being pulled up in bed he was very sore an it made him hurt, pain was a 10 but with adjustments in bed and pillows pain added for comfort pain has eased to around a 6

## 2023-01-10 NOTE — TOC Transition Note (Signed)
Transition of Care [facility]) - CM/SW Discharge Note   Patient Details  Name: Aaron Leon MRN: [id number] Date of Birth: 1950/05/30  [facility] CM/SW Contact:  Elliot Gault, LCSW Phone Number: 01/10/2023, 10:51 AM   Clinical Narrative:     Pt stable for dc per MD. Plan remains for DC home with [facility]. Updated Morrie Sheldon at [employer] of pt's dc.  No other TOC needs for dc.  Final next level of care: Home w Home Health Services Barriers to Discharge: Continued Medical Work up   Patient Goals and CMS Choice CMS Medicare.gov Compare Post Acute Care list provided to:: Patient Represenative (must comment) Choice offered to / list presented to : Patient, Spouse  Discharge Placement                         Discharge Plan and Services Additional resources added to the After Visit Summary for   In-house Referral: Clinical Social Work Discharge Planning Services: CM Consult Post Acute Care Choice: Home Health                    HH Arranged: RN, PT [facility] Agency: [facility] Date [facility] Agency Contacted: 01/09/23   Representative spoke with at [facility] Agency: Morrie Sheldon  Social Determinants of Health (SDOH) Interventions SDOH Screenings   Food Insecurity: No Food Insecurity (01/08/2023)  Housing: Low Risk  (01/08/2023)  Transportation Needs: No Transportation Needs (01/08/2023)  Utilities: Not At Risk (01/08/2023)  Financial Resource Strain: Medium Risk (06/05/2022)   Received from [facility]  Social Connections: Unknown (08/03/2021)   Received from [facility]  Stress: No Stress Concern Present (06/01/2022)   Received from [facility], [facility]  Tobacco Use: Medium Risk (01/07/2023)     Readmission Risk Interventions    01/08/2023    2:01 PM 07/28/2022    5:22 PM  Readmission Risk Prevention Plan  Transportation Screening Complete Complete  PCP or Specialist Appt within 3-5 Days  Complete  HRI or Home  Care Consult  Complete  Social Work Consult for Recovery Care Planning/Counseling  Complete  Palliative Care Screening  Not Applicable  Medication Review [employer]) Complete Complete  HRI or Home Care Consult Complete   SW Recovery Care/Counseling Consult Complete   Palliative Care Screening Not Applicable   Skilled Nursing Facility Not Applicable

## 2023-01-10 NOTE — Discharge Summary (Signed)
Physician Discharge Summary   Patient: Aaron Leon MRN: [id number] DOB: March 16, 1951  Admit date:     01/07/2023  Discharge date: 01/10/23  Discharge Physician: Kathlen Mody   PCP: [name]   Recommendations at discharge:  Please follow up with PCP in one week.  Please check cbc and bmp in one week.   Discharge Diagnoses: Principal Problem:   Acute on chronic respiratory failure with hypoxia (HCC) Active Problems:   Pulmonary embolism (HCC)   Pneumonia due to COVID-19 virus   Severe sepsis (HCC)   Acute metabolic encephalopathy   Diabetes mellitus (HCC)   Essential hypertension   Dementia with behavioral disturbance (HCC)   COVID-19   Protein-calorie malnutrition, severe    Hospital Course: Aaron Leon is a 72 y.o. male with medical history significant for Pulmonary embolism, CVA, HTN, DM, Parkinson's, dementia, COPD with chronic respiratory failure on 3 L.  He has had chronic intermittent episodes of coughing while eating, per spouse he has had many swallow evaluations-deemed normal, and he is on a normal diet.  Chest x-ray shows bibasilar airspace disease suggest recurrent pneumonia or aspiration. Pelvic x-ray, head CT, cervical CT unremarkable. IV Ceftriaxone and azithromycin given.  Pt continued to improve and his oxygen weaned down to the home oxygen of 3 lit/min.   Assessment and Plan:    Acute on chronic respiratory failure with hypoxia (HCC) Patient is on 3 L of nasal cannula oxygen at baseline  Initially required about 10 lit, weaned down to 6 lit/min yesterday and down to 3 lit/min today.   Acute respiratory failure probably secondary to pneumonia and COVID infection   Acute metabolic encephalopathy Presenting with confusion, fall, lethargy. Likely due to severe sepsis, pneumonia, dehydration with underlying dementia. Head Cervical CT negative for acute abnormality. He is more alert and answering questions appropriately. Wants to know  when he can go home.      Severe sepsis (HCC) Meets severe sepsis criteria with tachycardia heart rate 125-136, fever of 100.8, hypotensive with blood pressure of 86/69 mm hg, leukocytosis of 11.6, tachypnea- resp rate  21-28, hypoxia oxygen sats around 85% on 3 L of nasal cannula oxygen,  with evidence of endorgan dysfunction, respiratory failure, and lactic acidosis of 2.1 > 1.7 and encephalopathy.   Probably secondary to pneumonia. Sepsis physiology resolved.  Afebrile. Negative cultures so far.       Pneumonia due to COVID-19 virus Pneumonia with acute on chronic hypoxic respiratory failure and severe sepsis.  COVID test positive, likely with superimposed pneumonia considering chest x-ray findings suggesting - recurrent pneumonia or aspiration pneumonia.   Chronic and unchanged swallowing problems. Improving inflammatory markers. His oxygen weaned down to 3 lit/min ( home oxygen)  SLP evaluations done and recommendations given.  Started on IV Rocephin and Zithromax and transitioned to oral Augmentin to complete the course. Mora Appl on Barcitinab, with improvement.  Discharged on 5 days of prednisone to complete the course.      Pulmonary embolism (HCC) History of PE.  Resume Eliquis   Dementia with behavioral disturbance (HCC) Baseline dementia, and Parkinson's disease. -Resume Sinemet No behavioral abnormalities.    Essential hypertension Optimal BP parameters.      Diabetes mellitus (HCC)/ insulin dependent. Well controlled CBG'S. CBG (last 3)  Resume home meds on discharge.      Hypokalemia:  Replaced.     Consultants: none Procedures performed: none.   Disposition: Home Diet recommendation:  Discharge Diet Orders (From admission, onward)  Start     Ordered   01/10/23 0000  Diet - low sodium heart healthy        01/10/23 1027           Regular diet DISCHARGE MEDICATION: Allergies as of 01/10/2023       Reactions   Barbiturates Other (See  Comments)   Blacks out   Seroquel  [quetiapine] Other (See Comments)   Hallucination   Shellfish Allergy Hives        Medication List     STOP taking these medications    insulin detemir 100 UNIT/ML injection Commonly known as: LEVEMIR       TAKE these medications    acetaminophen 500 MG tablet Commonly known as: TYLENOL Take 500 mg by mouth every 6 (six) hours as needed for moderate pain (pain score 4-6).   albuterol 108 (90 Base) MCG/ACT inhaler Commonly known as: VENTOLIN HFA Inhale 2 puffs into the lungs every 6 (six) hours as needed for wheezing or shortness of breath.   albuterol (2.5 MG/3ML) 0.083% nebulizer solution Commonly known as: PROVENTIL Take 2.5 mg by nebulization every 6 (six) hours as needed for wheezing or shortness of breath.   amoxicillin-clavulanate 875-125 MG tablet Commonly known as: AUGMENTIN Take 1 tablet by mouth 2 (two) times daily for 4 days.   apixaban 5 MG Tabs tablet Commonly known as: ELIQUIS Take 1 tablet (5 mg total) by mouth 2 (two) times daily.   ascorbic acid 500 MG tablet Commonly known as: VITAMIN C Take 500 mg by mouth daily.   aspirin 325 MG tablet Take 325 mg by mouth daily.   atorvastatin 40 MG tablet Commonly known as: LIPITOR Take 1 tablet (40 mg total) by mouth at bedtime.   baclofen 10 MG tablet Commonly known as: LIORESAL Take 10-20 mg by mouth daily.   bisacodyl 5 MG EC tablet Commonly known as: DULCOLAX Take 1 tablet (5 mg total) by mouth daily. What changed:  when to take this reasons to take this   budesonide-formoterol 80-4.5 MCG/ACT inhaler Commonly known as: SYMBICORT Inhale 2 puffs into the lungs 2 (two) times daily.   carbidopa-levodopa 25-100 MG tablet Commonly known as: SINEMET IR Take 2 tablets by mouth 3 (three) times daily.   carbidopa-levodopa 50-200 MG tablet Commonly known as: SINEMET CR Take 1 tablet by mouth at bedtime.   cetirizine 10 MG tablet Commonly known as:  ZYRTEC Take 10 mg by mouth in the morning.   cholecalciferol 25 MCG (1000 UNIT) tablet Commonly known as: VITAMIN D3 Take 1,000 Units by mouth in the morning and at bedtime.   clotrimazole 1 % cream Commonly known as: LOTRIMIN Apply to affected area 2 times daily   ferrous sulfate 325 (65 FE) MG EC tablet Take 325 mg by mouth in the morning.   furosemide 20 MG tablet Commonly known as: LASIX Take 5 mg by mouth at bedtime.   gabapentin 300 MG capsule Commonly known as: NEURONTIN Take 2 capsules (600 mg total) by mouth 2 (two) times daily with breakfast and lunch.   galantamine 24 MG 24 hr capsule Commonly known as: RAZADYNE ER Take 24 mg by mouth every evening.   insulin aspart 100 UNIT/ML injection Commonly known as: novoLOG Inject 1-3 Units into the skin 3 (three) times daily before meals. Per sliding scale   lisinopril 2.5 MG tablet Commonly known as: ZESTRIL Take 2.5 mg by mouth daily.   magnesium oxide 400 (240 Mg) MG tablet Commonly known as: MAG-OX  Take 400-800 mg by mouth daily. 2 tablets in the morning and 1 tablet in the evening   melatonin 3 MG Tabs tablet Take 3 mg by mouth at bedtime.   memantine 10 MG tablet Commonly known as: NAMENDA Take 20 mg by mouth at bedtime.   metFORMIN 1000 MG tablet Commonly known as: GLUCOPHAGE Take 500 mg by mouth 2 (two) times daily with a meal.   metoprolol tartrate 25 MG tablet Commonly known as: LOPRESSOR Take 12.5 mg by mouth 2 (two) times daily.   multivitamin with minerals Tabs tablet Take 1 tablet by mouth daily.   omeprazole 20 MG capsule Commonly known as: PRILOSEC Take 20 mg by mouth in the morning.   oxybutynin 10 MG 24 hr tablet Commonly known as: DITROPAN-XL Take 10 mg by mouth at bedtime.   oxyCODONE-acetaminophen 10-325 MG tablet Commonly known as: PERCOCET Take 1 tablet by mouth every 6 (six) hours as needed for pain.   predniSONE 50 MG tablet Commonly known as: DELTASONE Take 1 tablet (50  mg total) by mouth daily for 5 days. Start taking on: January 11, 2023   sertraline 100 MG tablet Commonly known as: ZOLOFT Take 100 mg by mouth in the morning.   tamsulosin 0.4 MG Caps capsule Commonly known as: FLOMAX Take 0.4 mg by mouth at bedtime.   VITAMIN A PO Take by mouth.   zolpidem 10 MG tablet Commonly known as: AMBIEN Take 10 mg by mouth at bedtime.        Follow-up Information     [facility] Follow up.   Why: [facility] will call you to schedule in home visits.        [facility] Schedule an appointment as soon as possible for a visit in 1 week(s).   Contact information: [address] [facility] [place] [address] [phone]                Discharge Exam: Ceasar Mons Weights   01/07/23 1414  Weight: 65.8 kg   General exam: Appears calm and comfortable  Respiratory system: Clear to auscultation. Respiratory effort normal. Cardiovascular system: S1 & S2 heard, RRR. No JVD, Gastrointestinal system: Abdomen is nondistended, soft and nontender.  Central nervous system: Alert and oriented. Extremities: Symmetric 5 x 5 power. Skin: No rashes, Psychiatry:Mood & affect appropriate.    Condition at discharge: fair  The results of significant diagnostics from this hospitalization (including imaging, microbiology, ancillary and laboratory) are listed below for reference.   Imaging Studies: CT Head Wo Contrast  Result Date: 01/07/2023 CLINICAL DATA:  Head trauma, minor (Age >= 65y); Neck trauma (Age >= 65y) EXAM: CT HEAD WITHOUT CONTRAST CT CERVICAL SPINE WITHOUT CONTRAST TECHNIQUE: Multidetector CT imaging of the head and cervical spine was performed following the standard protocol without intravenous contrast. Multiplanar CT image reconstructions of the cervical spine were also generated. RADIATION DOSE REDUCTION: This exam was performed according to the departmental dose-optimization program which includes  automated exposure control, adjustment of the mA and/or kV according to patient size and/or use of iterative reconstruction technique. COMPARISON:  None Available. FINDINGS: CT HEAD FINDINGS Brain: No evidence of acute infarction, hemorrhage, hydrocephalus, extra-axial collection or mass lesion/mass effect. Vascular: Calcific atherosclerosis. Skull: No acute fracture. Sinuses/Orbits: Sinuses.  No acute orbital findings. Other: No mastoid effusions CT CERVICAL SPINE FINDINGS Alignment: No substantial sagittal subluxation. Skull base and vertebrae: No acute fracture. Similar chronic height loss at C7. Vertebral heights are maintained. Osteopenia. C5-T1 ACDF. Soft tissues and  spinal canal: No prevertebral fluid or swelling. No visible canal hematoma. Disc levels: Multilevel bony degenerative change including facet and uncovertebral hypertrophy and disc height loss. Upper chest: Emphysema. IMPRESSION: No evidence of acute abnormality intracranially or in the cervical spine. Electronically Signed   By: Feliberto Harts M.D.   On: 01/07/2023 18:19   CT Cervical Spine Wo Contrast  Result Date: 01/07/2023 CLINICAL DATA:  Head trauma, minor (Age >= 65y); Neck trauma (Age >= 65y) EXAM: CT HEAD WITHOUT CONTRAST CT CERVICAL SPINE WITHOUT CONTRAST TECHNIQUE: Multidetector CT imaging of the head and cervical spine was performed following the standard protocol without intravenous contrast. Multiplanar CT image reconstructions of the cervical spine were also generated. RADIATION DOSE REDUCTION: This exam was performed according to the departmental dose-optimization program which includes automated exposure control, adjustment of the mA and/or kV according to patient size and/or use of iterative reconstruction technique. COMPARISON:  None Available. FINDINGS: CT HEAD FINDINGS Brain: No evidence of acute infarction, hemorrhage, hydrocephalus, extra-axial collection or mass lesion/mass effect. Vascular: Calcific atherosclerosis.  Skull: No acute fracture. Sinuses/Orbits: Sinuses.  No acute orbital findings. Other: No mastoid effusions CT CERVICAL SPINE FINDINGS Alignment: No substantial sagittal subluxation. Skull base and vertebrae: No acute fracture. Similar chronic height loss at C7. Vertebral heights are maintained. Osteopenia. C5-T1 ACDF. Soft tissues and spinal canal: No prevertebral fluid or swelling. No visible canal hematoma. Disc levels: Multilevel bony degenerative change including facet and uncovertebral hypertrophy and disc height loss. Upper chest: Emphysema. IMPRESSION: No evidence of acute abnormality intracranially or in the cervical spine. Electronically Signed   By: Feliberto Harts M.D.   On: 01/07/2023 18:19   DG Pelvis 1-2 Views  Result Date: 01/07/2023 CLINICAL DATA:  Larey Seat several days ago. EXAM: PELVIS - 1-2 VIEW COMPARISON:  CT 06/17/2022 FINDINGS: The patient is rotated towards the right. The bones are osteopenic, limiting sensitivity. I do not see evidence of regional fracture. Previous hip arthroplasty on the right. Previous ORIF of intertrochanteric fracture on the left. Previous lower lumbar fusion. IMPRESSION: No acute or traumatic finding. Previous right hip arthroplasty. Previous ORIF of intertrochanteric fracture on the left. Because of osteopenia, rotation and overlying gas an artifact, if suspicion is high, consider pelvic CT. Otherwise, these films are interpreted as negative. Electronically Signed   By: Paulina Fusi M.D.   On: 01/07/2023 17:49   DG Chest Portable 1 View  Result Date: 01/07/2023 CLINICAL DATA:  Sepsis EXAM: PORTABLE CHEST 1 VIEW COMPARISON:  07/27/2022 plain film and CT FINDINGS: Osteopenia. Old right rib fractures. Midline trachea. Mild cardiomegaly. Atherosclerosis in the transverse aorta. Mild right hemidiaphragm elevation. No pleural effusion or pneumothorax. The upper lungs are clear. Bibasilar airspace disease is similar on the left and increased on the right. IMPRESSION:  Bibasilar airspace disease, similar on the left and new on the right. Favor recurrent pneumonia or aspiration. Cardiomegaly without congestive failure. Aortic Atherosclerosis (ICD10-I70.0). Electronically Signed   By: Jeronimo Greaves M.D.   On: 01/07/2023 17:18    Microbiology: Results for orders placed or performed during the hospital encounter of 01/07/23  Culture, blood (Routine x 2)     Status: None (Preliminary result)   Collection Time: 01/07/23  2:27 PM   Specimen: BLOOD  Result Value Ref Range Status   Specimen Description BLOOD BLOOD RIGHT ARM  Final   Special Requests   Final    BOTTLES DRAWN AEROBIC ONLY Blood Culture results may not be optimal due to an excessive volume of blood received in  culture bottles   Culture   Final    NO GROWTH 3 DAYS Performed at [facility], [address] [place], [place] [address]    Report Status PENDING  Incomplete  Culture, blood (Routine x 2)     Status: None (Preliminary result)   Collection Time: 01/07/23  2:29 PM   Specimen: BLOOD  Result Value Ref Range Status   Specimen Description BLOOD BLOOD LEFT HAND  Final   Special Requests   Final    BOTTLES DRAWN AEROBIC AND ANAEROBIC Blood Culture results may not be optimal due to an excessive volume of blood received in culture bottles   Culture   Final    NO GROWTH 3 DAYS Performed at [facility], [address] [place], [place] [address]    Report Status PENDING  Incomplete  Resp panel by RT-PCR (RSV, Flu A&B, Covid) Anterior Nasal Swab     Status: Abnormal   Collection Time: 01/07/23  2:39 PM   Specimen: Anterior Nasal Swab  Result Value Ref Range Status   SARS Coronavirus 2 by RT PCR POSITIVE (A) NEGATIVE Final    Comment: (NOTE) SARS-CoV-2 target nucleic acids are DETECTED.  The SARS-CoV-2 RNA is generally detectable in upper respiratory specimens during the acute phase of infection. Positive results are indicative of the presence of the identified virus, but do not rule out  bacterial infection or co-infection with other pathogens not detected by the test. Clinical correlation with patient history and other diagnostic information is necessary to determine patient infection status. The expected result is Negative.  Fact Sheet for Patients: BloggerCourse.com  Fact Sheet for Healthcare Providers: SeriousBroker.it  This test is not yet approved or cleared by the [place] FDA and  has been authorized for detection and/or diagnosis of SARS-CoV-2 by FDA under an Emergency Use Authorization (EUA).  This EUA will remain in effect (meaning this test can be used) for the duration of  the COVID-19 declaration under Section 564(b)(1) of the A ct, 21 U.S.C. section 360bbb-3(b)(1), unless the authorization is terminated or revoked sooner.     Influenza A by PCR NEGATIVE NEGATIVE Final   Influenza B by PCR NEGATIVE NEGATIVE Final    Comment: (NOTE) The Xpert Xpress SARS-CoV-2/FLU/RSV plus assay is intended as an aid in the diagnosis of influenza from Nasopharyngeal swab specimens and should not be used as a sole basis for treatment. Nasal washings and aspirates are unacceptable for Xpert Xpress SARS-CoV-2/FLU/RSV testing.  Fact Sheet for Patients: BloggerCourse.com  Fact Sheet for Healthcare Providers: SeriousBroker.it  This test is not yet approved or cleared by the [place] FDA and has been authorized for detection and/or diagnosis of SARS-CoV-2 by FDA under an Emergency Use Authorization (EUA). This EUA will remain in effect (meaning this test can be used) for the duration of the COVID-19 declaration under Section 564(b)(1) of the Act, 21 U.S.C. section 360bbb-3(b)(1), unless the authorization is terminated or revoked.     Resp Syncytial Virus by PCR NEGATIVE NEGATIVE Final    Comment: (NOTE) Fact Sheet for  Patients: BloggerCourse.com  Fact Sheet for Healthcare Providers: SeriousBroker.it  This test is not yet approved or cleared by the [place] FDA and has been authorized for detection and/or diagnosis of SARS-CoV-2 by FDA under an Emergency Use Authorization (EUA). This EUA will remain in effect (meaning this test can be used) for the duration of the COVID-19 declaration under Section 564(b)(1) of the Act, 21 U.S.C. section 360bbb-3(b)(1), unless the authorization is terminated or  revoked.  Performed at [facility], [address], [place], [place] [address]   MRSA Next Gen by PCR, Nasal     Status: None   Collection Time: 01/07/23 10:00 PM   Specimen: Nasal Mucosa; Nasal Swab  Result Value Ref Range Status   MRSA by PCR Next Gen NOT DETECTED NOT DETECTED Final    Comment: (NOTE) The GeneXpert MRSA Assay (FDA approved for NASAL specimens only), is one component of a comprehensive MRSA colonization surveillance program. It is not intended to diagnose MRSA infection nor to guide or monitor treatment for MRSA infections. Test performance is not FDA approved in patients less than 38 years old. Performed at [facility], [address] [place], [place] [address]     Labs: CBC: Recent Labs  Lab 01/07/23 1427 01/08/23 0305 01/10/23 0440  WBC 11.6* 5.4 6.6  NEUTROABS 10.4*  --  5.3  HGB 14.2 11.3* 11.7*  HCT 47.5 37.3* 38.0*  MCV 88.3 89.2 85.6  PLT 345 245 268   Basic Metabolic Panel: Recent Labs  Lab 01/07/23 1427 01/08/23 0305 01/10/23 0440  NA 145 142 138  K 3.7 3.6 3.4*  CL 105 107 104  CO2 22 27 26   GLUCOSE 294* 146* 148*  BUN 32* 32* 27*  CREATININE 1.01 0.89 0.63  CALCIUM 9.0 7.9* 8.2*   Liver Function Tests: Recent Labs  Lab 01/07/23 1427  AST 14*  ALT 13  ALKPHOS 93  BILITOT 1.0  PROT 7.6  ALBUMIN 3.2*   CBG: Recent Labs  Lab 01/09/23 2112 01/09/23 2354 01/10/23 0332 01/10/23 0750  01/10/23 1121  GLUCAP 192* 188* 142* 141* 283*    Discharge time spent: 41 minutes.   Signed: Kathlen Mody, MD [facility] 01/10/2023

## 2023-01-12 LAB — CULTURE, BLOOD (ROUTINE X 2)
Culture: NO GROWTH
Culture: NO GROWTH

## 2023-04-16 ENCOUNTER — Emergency Department (HOSPITAL_COMMUNITY): Payer: No Typology Code available for payment source

## 2023-04-16 ENCOUNTER — Inpatient Hospital Stay (HOSPITAL_COMMUNITY)
Admission: EM | Admit: 2023-04-16 | Discharge: 2023-04-26 | DRG: 871 | Disposition: E | Payer: No Typology Code available for payment source | Attending: Student | Admitting: Student

## 2023-04-16 ENCOUNTER — Other Ambulatory Visit: Payer: Self-pay

## 2023-04-16 ENCOUNTER — Encounter (HOSPITAL_COMMUNITY): Payer: Self-pay

## 2023-04-16 DIAGNOSIS — Z794 Long term (current) use of insulin: Secondary | ICD-10-CM | POA: Diagnosis not present

## 2023-04-16 DIAGNOSIS — G4700 Insomnia, unspecified: Secondary | ICD-10-CM | POA: Diagnosis present

## 2023-04-16 DIAGNOSIS — A419 Sepsis, unspecified organism: Secondary | ICD-10-CM | POA: Diagnosis present

## 2023-04-16 DIAGNOSIS — Z993 Dependence on wheelchair: Secondary | ICD-10-CM

## 2023-04-16 DIAGNOSIS — E872 Acidosis, unspecified: Secondary | ICD-10-CM | POA: Diagnosis present

## 2023-04-16 DIAGNOSIS — J440 Chronic obstructive pulmonary disease with acute lower respiratory infection: Secondary | ICD-10-CM | POA: Diagnosis present

## 2023-04-16 DIAGNOSIS — Z8673 Personal history of transient ischemic attack (TIA), and cerebral infarction without residual deficits: Secondary | ICD-10-CM

## 2023-04-16 DIAGNOSIS — J690 Pneumonitis due to inhalation of food and vomit: Secondary | ICD-10-CM | POA: Diagnosis present

## 2023-04-16 DIAGNOSIS — E119 Type 2 diabetes mellitus without complications: Secondary | ICD-10-CM | POA: Diagnosis present

## 2023-04-16 DIAGNOSIS — Z7901 Long term (current) use of anticoagulants: Secondary | ICD-10-CM

## 2023-04-16 DIAGNOSIS — I10 Essential (primary) hypertension: Secondary | ICD-10-CM | POA: Diagnosis present

## 2023-04-16 DIAGNOSIS — F0280 Dementia in other diseases classified elsewhere without behavioral disturbance: Secondary | ICD-10-CM | POA: Diagnosis present

## 2023-04-16 DIAGNOSIS — F03918 Unspecified dementia, unspecified severity, with other behavioral disturbance: Secondary | ICD-10-CM | POA: Diagnosis not present

## 2023-04-16 DIAGNOSIS — Z87891 Personal history of nicotine dependence: Secondary | ICD-10-CM

## 2023-04-16 DIAGNOSIS — J9621 Acute and chronic respiratory failure with hypoxia: Secondary | ICD-10-CM | POA: Diagnosis present

## 2023-04-16 DIAGNOSIS — R7989 Other specified abnormal findings of blood chemistry: Secondary | ICD-10-CM | POA: Insufficient documentation

## 2023-04-16 DIAGNOSIS — Z681 Body mass index (BMI) 19 or less, adult: Secondary | ICD-10-CM

## 2023-04-16 DIAGNOSIS — I959 Hypotension, unspecified: Secondary | ICD-10-CM | POA: Diagnosis present

## 2023-04-16 DIAGNOSIS — J189 Pneumonia, unspecified organism: Secondary | ICD-10-CM | POA: Diagnosis present

## 2023-04-16 DIAGNOSIS — R680 Hypothermia, not associated with low environmental temperature: Secondary | ICD-10-CM | POA: Diagnosis present

## 2023-04-16 DIAGNOSIS — Z7984 Long term (current) use of oral hypoglycemic drugs: Secondary | ICD-10-CM

## 2023-04-16 DIAGNOSIS — Z885 Allergy status to narcotic agent status: Secondary | ICD-10-CM

## 2023-04-16 DIAGNOSIS — Z91013 Allergy to seafood: Secondary | ICD-10-CM

## 2023-04-16 DIAGNOSIS — I2699 Other pulmonary embolism without acute cor pulmonale: Secondary | ICD-10-CM | POA: Diagnosis not present

## 2023-04-16 DIAGNOSIS — Z515 Encounter for palliative care: Secondary | ICD-10-CM

## 2023-04-16 DIAGNOSIS — Z96641 Presence of right artificial hip joint: Secondary | ICD-10-CM | POA: Diagnosis present

## 2023-04-16 DIAGNOSIS — R627 Adult failure to thrive: Secondary | ICD-10-CM | POA: Diagnosis present

## 2023-04-16 DIAGNOSIS — Z66 Do not resuscitate: Secondary | ICD-10-CM | POA: Diagnosis present

## 2023-04-16 DIAGNOSIS — J449 Chronic obstructive pulmonary disease, unspecified: Secondary | ICD-10-CM | POA: Diagnosis present

## 2023-04-16 DIAGNOSIS — R6520 Severe sepsis without septic shock: Secondary | ICD-10-CM | POA: Diagnosis present

## 2023-04-16 DIAGNOSIS — E8809 Other disorders of plasma-protein metabolism, not elsewhere classified: Secondary | ICD-10-CM | POA: Diagnosis present

## 2023-04-16 DIAGNOSIS — L8990 Pressure ulcer of unspecified site, unspecified stage: Secondary | ICD-10-CM | POA: Insufficient documentation

## 2023-04-16 DIAGNOSIS — E876 Hypokalemia: Secondary | ICD-10-CM | POA: Diagnosis not present

## 2023-04-16 DIAGNOSIS — Z79899 Other long term (current) drug therapy: Secondary | ICD-10-CM

## 2023-04-16 DIAGNOSIS — Z1152 Encounter for screening for COVID-19: Secondary | ICD-10-CM | POA: Diagnosis not present

## 2023-04-16 DIAGNOSIS — Z7951 Long term (current) use of inhaled steroids: Secondary | ICD-10-CM

## 2023-04-16 DIAGNOSIS — G20A1 Parkinson's disease without dyskinesia, without mention of fluctuations: Secondary | ICD-10-CM | POA: Diagnosis present

## 2023-04-16 DIAGNOSIS — Z9981 Dependence on supplemental oxygen: Secondary | ICD-10-CM

## 2023-04-16 DIAGNOSIS — E46 Unspecified protein-calorie malnutrition: Secondary | ICD-10-CM | POA: Diagnosis present

## 2023-04-16 DIAGNOSIS — Z981 Arthrodesis status: Secondary | ICD-10-CM

## 2023-04-16 DIAGNOSIS — Z7982 Long term (current) use of aspirin: Secondary | ICD-10-CM

## 2023-04-16 DIAGNOSIS — Z86711 Personal history of pulmonary embolism: Secondary | ICD-10-CM

## 2023-04-16 DIAGNOSIS — G8929 Other chronic pain: Secondary | ICD-10-CM | POA: Diagnosis present

## 2023-04-16 DIAGNOSIS — Z888 Allergy status to other drugs, medicaments and biological substances status: Secondary | ICD-10-CM

## 2023-04-16 LAB — APTT: aPTT: 34 s (ref 24–36)

## 2023-04-16 LAB — CBC WITH DIFFERENTIAL/PLATELET
Abs Immature Granulocytes: 0.2 10*3/uL — ABNORMAL HIGH (ref 0.00–0.07)
Band Neutrophils: 1 %
Basophils Absolute: 0 10*3/uL (ref 0.0–0.1)
Basophils Relative: 0 %
Eosinophils Absolute: 0 10*3/uL (ref 0.0–0.5)
Eosinophils Relative: 0 %
HCT: 39.9 % (ref 39.0–52.0)
Hemoglobin: 12.2 g/dL — ABNORMAL LOW (ref 13.0–17.0)
Lymphocytes Relative: 6 %
Lymphs Abs: 1 10*3/uL (ref 0.7–4.0)
MCH: 27.7 pg (ref 26.0–34.0)
MCHC: 30.6 g/dL (ref 30.0–36.0)
MCV: 90.7 fL (ref 80.0–100.0)
Metamyelocytes Relative: 1 %
Monocytes Absolute: 0 10*3/uL — ABNORMAL LOW (ref 0.1–1.0)
Monocytes Relative: 0 %
Neutro Abs: 15.4 10*3/uL — ABNORMAL HIGH (ref 1.7–7.7)
Neutrophils Relative %: 92 %
Platelets: 300 10*3/uL (ref 150–400)
RBC: 4.4 MIL/uL (ref 4.22–5.81)
RDW: 19 % — ABNORMAL HIGH (ref 11.5–15.5)
WBC: 16.6 10*3/uL — ABNORMAL HIGH (ref 4.0–10.5)
nRBC: 0 % (ref 0.0–0.2)

## 2023-04-16 LAB — COMPREHENSIVE METABOLIC PANEL
ALT: 9 U/L (ref 0–44)
AST: 15 U/L (ref 15–41)
Albumin: 1.6 g/dL — ABNORMAL LOW (ref 3.5–5.0)
Alkaline Phosphatase: 143 U/L — ABNORMAL HIGH (ref 38–126)
Anion gap: 12 (ref 5–15)
BUN: 33 mg/dL — ABNORMAL HIGH (ref 8–23)
CO2: 25 mmol/L (ref 22–32)
Calcium: 7.5 mg/dL — ABNORMAL LOW (ref 8.9–10.3)
Chloride: 105 mmol/L (ref 98–111)
Creatinine, Ser: 1.13 mg/dL (ref 0.61–1.24)
GFR, Estimated: >60 mL/min (ref >60)
Glucose, Bld: 139 mg/dL — ABNORMAL HIGH (ref 70–99)
Potassium: 3.1 mmol/L — ABNORMAL LOW (ref 3.5–5.1)
Sodium: 142 mmol/L (ref 135–145)
Total Bilirubin: 0.8 mg/dL (ref 0.0–1.2)
Total Protein: 4.9 g/dL — ABNORMAL LOW (ref 6.5–8.1)

## 2023-04-16 LAB — PROTIME-INR
INR: 1.2 (ref 0.8–1.2)
Prothrombin Time: 15.4 s — ABNORMAL HIGH (ref 11.4–15.2)

## 2023-04-16 LAB — LACTIC ACID, PLASMA: Lactic Acid, Venous: 2.1 mmol/L (ref 0.5–1.9)

## 2023-04-16 LAB — BRAIN NATRIURETIC PEPTIDE: B Natriuretic Peptide: 170 pg/mL — ABNORMAL HIGH (ref 0.0–100.0)

## 2023-04-16 MED ORDER — SODIUM CHLORIDE 0.9 % IV SOLN
2.0000 g | Freq: Once | INTRAVENOUS | Status: AC
  Administered 2023-04-16: 2 g via INTRAVENOUS
  Filled 2023-04-16: qty 20

## 2023-04-16 MED ORDER — SODIUM CHLORIDE 0.9 % IV SOLN
500.0000 mg | Freq: Once | INTRAVENOUS | Status: AC
  Administered 2023-04-16: 500 mg via INTRAVENOUS
  Filled 2023-04-16: qty 5

## 2023-04-16 MED ORDER — SODIUM CHLORIDE 0.9 % IV BOLUS
2000.0000 mL | Freq: Once | INTRAVENOUS | Status: AC
  Administered 2023-04-16: 2000 mL via INTRAVENOUS

## 2023-04-16 NOTE — ED Provider Notes (Signed)
[place] EMERGENCY DEPARTMENT AT [facility] Provider Note   CSN: [id number] Arrival date & time: 04/16/23  2057     History  Chief Complaint  Patient presents with   Hypotension   Failure To Thrive    Aaron Leon is a 73 y.o. male.  Patient with a dry cough and weakness and not eating or drinking for 3 days.  Patient has a history of diabetes hypertension and COPD   Shortness of Breath      Home Medications Prior to Admission medications   Medication Sig Start Date End Date Taking? Authorizing Provider  acetaminophen (TYLENOL) 500 MG tablet Take 500 mg by mouth every 6 (six) hours as needed for moderate pain (pain score 4-6).    [provider]  albuterol (PROVENTIL) (2.5 MG/3ML) 0.083% nebulizer solution Take 2.5 mg by nebulization every 6 (six) hours as needed for wheezing or shortness of breath.    [provider]  albuterol (VENTOLIN HFA) 108 (90 Base) MCG/ACT inhaler Inhale 2 puffs into the lungs every 6 (six) hours as needed for wheezing or shortness of breath.    [provider]  apixaban (ELIQUIS) 5 MG TABS tablet Take 1 tablet (5 mg total) by mouth 2 (two) times daily. 06/24/22   Shahmehdi, Gemma Payor, MD  ascorbic acid (VITAMIN C) 500 MG tablet Take 500 mg by mouth daily.    [provider]  aspirin 325 MG tablet Take 325 mg by mouth daily.    [provider]  atorvastatin (LIPITOR) 40 MG tablet Take 1 tablet (40 mg total) by mouth at bedtime. 06/19/22 01/07/23  Shahmehdi, Gemma Payor, MD  baclofen (LIORESAL) 10 MG tablet Take 10-20 mg by mouth daily.    [provider]  bisacodyl (DULCOLAX) 5 MG EC tablet Take 1 tablet (5 mg total) by mouth daily. Patient taking differently: Take 5 mg by mouth daily as needed for moderate constipation. 06/19/22   Shahmehdi, Gemma Payor, MD  budesonide-formoterol (SYMBICORT) 80-4.5 MCG/ACT inhaler Inhale 2 puffs into the lungs 2 (two) times daily.    [provider]   carbidopa-levodopa (SINEMET CR) 50-200 MG tablet Take 1 tablet by mouth at bedtime. 06/19/22 01/07/23  Shahmehdi, Gemma Payor, MD  carbidopa-levodopa (SINEMET IR) 25-100 MG tablet Take 2 tablets by mouth 3 (three) times daily. 02/20/21   [provider]  cetirizine (ZYRTEC) 10 MG tablet Take 10 mg by mouth in the morning.     [provider]  cholecalciferol (VITAMIN D3) 25 MCG (1000 UNIT) tablet Take 1,000 Units by mouth in the morning and at bedtime.    [provider]  clotrimazole (LOTRIMIN) 1 % cream Apply to affected area 2 times daily 08/25/22   Pollyann Savoy, MD  ferrous sulfate 325 (65 FE) MG EC tablet Take 325 mg by mouth in the morning.     [provider]  furosemide (LASIX) 20 MG tablet Take 5 mg by mouth at bedtime.    [provider]  gabapentin (NEURONTIN) 300 MG capsule Take 2 capsules (600 mg total) by mouth 2 (two) times daily with breakfast and lunch. 06/19/22 01/07/23  Kendell Bane, MD  galantamine (RAZADYNE ER) 24 MG 24 hr capsule Take 24 mg by mouth every evening. 02/20/21   [provider]  insulin aspart (NOVOLOG) 100 UNIT/ML injection Inject 1-3 Units into the skin 3 (three) times daily before meals. Per sliding scale    [provider]  lisinopril (ZESTRIL) 2.5 MG tablet  Take 2.5 mg by mouth daily.    [provider]  magnesium oxide (MAG-OX) 400 (240 Mg) MG tablet Take 400-800 mg by mouth daily. 2 tablets in the morning and 1 tablet in the evening    [provider]  melatonin 3 MG TABS tablet Take 3 mg by mouth at bedtime.    [provider]  memantine (NAMENDA) 10 MG tablet Take 20 mg by mouth at bedtime.    [provider]  metFORMIN (GLUCOPHAGE) 1000 MG tablet Take 500 mg by mouth 2 (two) times daily with a meal.    [provider]  metoprolol tartrate (LOPRESSOR) 25 MG tablet Take 12.5 mg by mouth 2 (two) times daily.    [provider]  Multiple  Vitamin (MULTIVITAMIN WITH MINERALS) TABS tablet Take 1 tablet by mouth daily. 01/10/23   Kathlen Mody, MD  omeprazole (PRILOSEC) 20 MG capsule Take 20 mg by mouth in the morning.     [provider]  oxybutynin (DITROPAN-XL) 10 MG 24 hr tablet Take 10 mg by mouth at bedtime.    [provider]  oxyCODONE-acetaminophen (PERCOCET) 10-325 MG tablet Take 1 tablet by mouth every 6 (six) hours as needed for pain.    [provider]  sertraline (ZOLOFT) 100 MG tablet Take 100 mg by mouth in the morning.     [provider]  tamsulosin (FLOMAX) 0.4 MG CAPS capsule Take 0.4 mg by mouth at bedtime.    [provider]  VITAMIN A PO Take by mouth.    [provider]  zolpidem (AMBIEN) 10 MG tablet Take 10 mg by mouth at bedtime.    [provider]      Allergies    Barbiturates, Seroquel  [quetiapine], and Shellfish allergy    Review of Systems   Review of Systems  Respiratory:  Positive for shortness of breath.     Physical Exam Updated Vital Signs BP 127/71   Pulse 90   Temp (!) 97 F (36.1 C) (Rectal)   Resp 15   SpO2 96%  Physical Exam  ED Results / Procedures / Treatments   Labs (all labs ordered are listed, but only abnormal results are displayed) Labs Reviewed  LACTIC ACID, PLASMA - Abnormal; Notable for the following components:      Result Value   Lactic Acid, Venous 2.1 (*)    All other components within normal limits  COMPREHENSIVE METABOLIC PANEL - Abnormal; Notable for the following components:   Potassium 3.1 (*)    Glucose, Bld 139 (*)    BUN 33 (*)    Calcium 7.5 (*)    Total Protein 4.9 (*)    Albumin 1.6 (*)    Alkaline Phosphatase 143 (*)    All other components within normal limits  CBC WITH DIFFERENTIAL/PLATELET - Abnormal; Notable for the following components:   WBC 16.6 (*)    Hemoglobin 12.2 (*)    RDW 19.0 (*)    Neutro Abs 15.4 (*)    Monocytes Absolute 0.0 (*)    Abs Immature  Granulocytes 0.20 (*)    All other components within normal limits  PROTIME-INR - Abnormal; Notable for the following components:   Prothrombin Time 15.4 (*)    All other components within normal limits  BRAIN NATRIURETIC PEPTIDE - Abnormal; Notable for the following components:   B Natriuretic Peptide 170.0 (*)    All other components within normal limits  CULTURE, BLOOD (ROUTINE X 2)  CULTURE, BLOOD (  ROUTINE X 2)  RESP PANEL BY RT-PCR (RSV, FLU A&B, COVID)  RVPGX2  APTT  LACTIC ACID, PLASMA  URINALYSIS, W/ REFLEX TO CULTURE (INFECTION SUSPECTED)    EKG None  Radiology DG Chest Port 1 View Result Date: 04/16/2023 CLINICAL DATA:  Questionable sepsis EXAM: PORTABLE CHEST 1 VIEW COMPARISON:  01/07/2023 FINDINGS: Heart normal size. Aortic atherosclerosis. Patchy bilateral airspace disease, right greater than left. Possible small right effusion. No acute bony abnormality. IMPRESSION: Patchy bilateral airspace disease, right greater than left concerning for pneumonia. Electronically Signed   By: Charlett Nose M.D.   On: 04/16/2023 22:14    Procedures Procedures    Medications Ordered in ED Medications  azithromycin (ZITHROMAX) 500 mg in sodium chloride 0.9 % 250 mL IVPB (500 mg Intravenous New Bag/Given 04/16/23 2219)  cefTRIAXone (ROCEPHIN) 2 g in sodium chloride 0.9 % 100 mL IVPB (0 g Intravenous Stopped 04/16/23 2218)  sodium chloride 0.9 % bolus 2,000 mL (2,000 mLs Intravenous New Bag/Given 04/16/23 2137)    ED Course/ Medical Decision Making/ A&P  CRITICAL CARE Performed by: Bethann Berkshire Total critical care time: 45 minutes Critical care time was exclusive of separately billable procedures and treating other patients. Critical care was necessary to treat or prevent imminent or life-threatening deterioration. Critical care was time spent personally by me on the following activities: development of treatment plan with patient and/or surrogate as well as nursing, discussions with  consultants, evaluation of patient's response to treatment, examination of patient, obtaining history from patient or surrogate, ordering and performing treatments and interventions, ordering and review of laboratory studies, ordering and review of radiographic studies, pulse oximetry and re-evaluation of patient's condition.                                Medical Decision Making Amount and/or Complexity of Data Reviewed Labs: ordered. Radiology: ordered.  Risk Decision regarding hospitalization.   Patient with community-acquired pneumonia and hypoxia.  He will be admitted to medicine        Final Clinical Impression(s) / ED Diagnoses Final diagnoses:  Community acquired pneumonia of right upper lobe of lung    Rx / DC Orders ED Discharge Orders     None         Bethann Berkshire, MD 04/20/23 1144

## 2023-04-16 NOTE — ED Triage Notes (Signed)
Pt bib RCMES from home with hypotension and low o2 sats. BP was in the 70s and oxygen in the low 80s. Hx of dementia.

## 2023-04-16 NOTE — Sepsis Progress Note (Signed)
Elink monitoring for the code sepsis protocol.  

## 2023-04-16 NOTE — Consult Note (Signed)
CODE SEPSIS - PHARMACY COMMUNICATION  **Broad Spectrum Antibiotics should be administered within 1 hour of Sepsis diagnosis**  Time Code Sepsis Called/Page Received: 2109  Antibiotics Ordered: azithromycin and ceftriaxone  Time of 1st antibiotic administration: 2146  Additional action taken by pharmacy: none  If necessary, Name of Provider/Nurse Contacted: n/a   Stephannie Broner Rodriguez-Guzman PharmD, BCPS 04/16/2023 10:09 PM

## 2023-04-16 NOTE — H&P (Signed)
History and Physical    Patient: Aaron Leon BJY:[id number] DOB: 18-Oct-1950 DOA: 04/16/2023 DOS: the patient was seen and examined on 04/17/2023 PCP: [name]  Patient coming from: Home  Chief Complaint:  Chief Complaint  Patient presents with   Hypotension   Failure To Thrive   HPI: Aaron Leon is a 73 y.o. male with medical history significant of hypertension, COPD with chronic respiratory failure on supplemental oxygen at 3 LPM, T2DM, pulmonary embolism, history of stroke, Parkinson's disease, dementia who presents to the emergency department from home via EMS due to low blood pressure and low O2 sats.  Patient was unable to provide history, history was obtained from ED physician and wife at bedside.  Per report, patient has not been eating or drinking for 3 to 4 days, wife states that patient was noted to be having increasing shortness of breath, BP was checked and it was 70s/50s, O2 sat was noted to be in low 80s.  EMS was activated and patient was taken to the ED for further evaluation and management.  There was no report of fever, chest pain, headache.  ED Course:  In the emergency department, temperature was reported to be 77F on arrival to the ED, but this has since increased to 85F, BP was 90/58, O2 sat was 94% on supplemental oxygen at 5 LPM, pulse 83 bpm, respiratory rate 11/min.  Workup in the ED showed normal CBC except for WBC of 16.6, hemoglobin 12.2, BMP was normal except for potassium 3.1, blood glucose 139, BUN 33, albumin 1.6, ALP 143, BNP 170, lactic acid 2.1 > 2.5 > 2.8.  Blood culture pending. Chest x-ray showed patchy bilateral airspace disease right greater than left concerning for pneumonia Patient was treated with IV ceftriaxone and azithromycin, IV hydration was provided per sepsis protocol.  Hospitalist was asked to admit patient for further evaluation and management.  Review of Systems: Review of systems as noted in the HPI. All other  systems reviewed and are negative.   Past Medical History:  Diagnosis Date   COPD (chronic obstructive pulmonary disease) (HCC)    Diabetes mellitus without complication (HCC)    "type 2"   H/O septic shock    Hypertension    Pulmonary embolism (HCC)    Stroke [facility])    Past Surgical History:  Procedure Laterality Date   BACK SURGERY     CATARACT EXTRACTION     CERVICAL FUSION     HIP PINNING Left    03/2022   KNEE SURGERY     TOTAL HIP ARTHROPLASTY Right 02/2021    Social History:  reports that he has quit smoking. His smoking use included e-cigarettes. He has never used smokeless tobacco. He reports that he does not currently use alcohol. He reports that he does not use drugs.   Allergies  Allergen Reactions   Barbiturates Other (See Comments)    Blacks out    Seroquel  [Quetiapine] Other (See Comments)    Hallucination   Shellfish Allergy Hives    History reviewed. No pertinent family history.   Prior to Admission medications   Medication Sig Start Date End Date Taking? Authorizing Provider  acetaminophen (TYLENOL) 500 MG tablet Take 500 mg by mouth every 6 (six) hours as needed for moderate pain (pain score 4-6).    [provider]  albuterol (PROVENTIL) (2.5 MG/3ML) 0.083% nebulizer solution Take 2.5 mg by nebulization every 6 (six) hours as needed for wheezing or shortness of breath.  [provider]  albuterol (VENTOLIN HFA) 108 (90 Base) MCG/ACT inhaler Inhale 2 puffs into the lungs every 6 (six) hours as needed for wheezing or shortness of breath.    [provider]  apixaban (ELIQUIS) 5 MG TABS tablet Take 1 tablet (5 mg total) by mouth 2 (two) times daily. 06/24/22   Shahmehdi, Gemma Payor, MD  ascorbic acid (VITAMIN C) 500 MG tablet Take 500 mg by mouth daily.    [provider]  aspirin 325 MG tablet Take 325 mg by mouth daily.    [provider]  atorvastatin (LIPITOR) 40 MG tablet Take 1 tablet (40 mg total) by mouth  at bedtime. 06/19/22 01/07/23  Shahmehdi, Gemma Payor, MD  baclofen (LIORESAL) 10 MG tablet Take 10-20 mg by mouth daily.    [provider]  bisacodyl (DULCOLAX) 5 MG EC tablet Take 1 tablet (5 mg total) by mouth daily. Patient taking differently: Take 5 mg by mouth daily as needed for moderate constipation. 06/19/22   Shahmehdi, Gemma Payor, MD  budesonide-formoterol (SYMBICORT) 80-4.5 MCG/ACT inhaler Inhale 2 puffs into the lungs 2 (two) times daily.    [provider]  carbidopa-levodopa (SINEMET CR) 50-200 MG tablet Take 1 tablet by mouth at bedtime. 06/19/22 01/07/23  Shahmehdi, Gemma Payor, MD  carbidopa-levodopa (SINEMET IR) 25-100 MG tablet Take 2 tablets by mouth 3 (three) times daily. 02/20/21   [provider]  cetirizine (ZYRTEC) 10 MG tablet Take 10 mg by mouth in the morning.     [provider]  cholecalciferol (VITAMIN D3) 25 MCG (1000 UNIT) tablet Take 1,000 Units by mouth in the morning and at bedtime.    [provider]  clotrimazole (LOTRIMIN) 1 % cream Apply to affected area 2 times daily 08/25/22   Pollyann Savoy, MD  ferrous sulfate 325 (65 FE) MG EC tablet Take 325 mg by mouth in the morning.     [provider]  furosemide (LASIX) 20 MG tablet Take 5 mg by mouth at bedtime.    [provider]  gabapentin (NEURONTIN) 300 MG capsule Take 2 capsules (600 mg total) by mouth 2 (two) times daily with breakfast and lunch. 06/19/22 01/07/23  Kendell Bane, MD  galantamine (RAZADYNE ER) 24 MG 24 hr capsule Take 24 mg by mouth every evening. 02/20/21   [provider]  insulin aspart (NOVOLOG) 100 UNIT/ML injection Inject 1-3 Units into the skin 3 (three) times daily before meals. Per sliding scale    [provider]  lisinopril (ZESTRIL) 2.5 MG tablet Take 2.5 mg by mouth daily.    [provider]  magnesium oxide (MAG-OX) 400 (240 Mg) MG tablet Take 400-800 mg by mouth daily. 2 tablets in the morning  and 1 tablet in the evening    [provider]  melatonin 3 MG TABS tablet Take 3 mg by mouth at bedtime.    [provider]  memantine (NAMENDA) 10 MG tablet Take 20 mg by mouth at bedtime.    [provider]  metFORMIN (GLUCOPHAGE) 1000 MG tablet Take 500 mg by mouth 2 (two) times daily with a meal.    [provider]  metoprolol tartrate (LOPRESSOR) 25 MG tablet Take 12.5 mg by mouth 2 (two) times daily.    [provider]  Multiple Vitamin (MULTIVITAMIN WITH MINERALS) TABS tablet Take 1 tablet by mouth daily. 01/10/23   Kathlen Mody, MD  omeprazole (PRILOSEC) 20 MG capsule Take 20 mg by mouth in the  morning.     [provider]  oxybutynin (DITROPAN-XL) 10 MG 24 hr tablet Take 10 mg by mouth at bedtime.    [provider]  oxyCODONE-acetaminophen (PERCOCET) 10-325 MG tablet Take 1 tablet by mouth every 6 (six) hours as needed for pain.    [provider]  sertraline (ZOLOFT) 100 MG tablet Take 100 mg by mouth in the morning.     [provider]  tamsulosin (FLOMAX) 0.4 MG CAPS capsule Take 0.4 mg by mouth at bedtime.    [provider]  VITAMIN A PO Take by mouth.    [provider]  zolpidem (AMBIEN) 10 MG tablet Take 10 mg by mouth at bedtime.    [provider]    Physical Exam: BP (!) 99/56   Pulse 87   Temp (!) 97.5 F (36.4 C) (Oral)   Resp 14   Wt 65.8 kg   SpO2 96%   BMI 19.67 kg/m   General: 73 y.o. year-old male well developed well nourished in no acute distress.  Alert and oriented x3. HEENT: NCAT, EOMI Neck: Supple, trachea medial Cardiovascular: Regular rate and rhythm with no rubs or gallops.  No thyromegaly or JVD noted.  No lower extremity edema. 2/4 pulses in all 4 extremities. Respiratory: Decreased breath sounds with diffuse rhonchi on auscultation.  Abdomen: Soft, nontender nondistended with normal bowel sounds x4 quadrants. Muskuloskeletal: No cyanosis,  clubbing or edema noted bilaterally Neuro: CN II-XII intact, strength 5/5 x 4, sensation, reflexes intact Skin: No ulcerative lesions noted or rashes Psychiatry: Mood is appropriate for condition and setting          Labs on Admission:  Basic Metabolic Panel: Recent Labs  Lab 04/16/23 2132  NA 142  K 3.1*  CL 105  CO2 25  GLUCOSE 139*  BUN 33*  CREATININE 1.13  CALCIUM 7.5*   Liver Function Tests: Recent Labs  Lab 04/16/23 2132  AST 15  ALT 9  ALKPHOS 143*  BILITOT 0.8  PROT 4.9*  ALBUMIN 1.6*   No results for input(s): "LIPASE", "AMYLASE" in the last 168 hours. No results for input(s): "AMMONIA" in the last 168 hours. CBC: Recent Labs  Lab 04/16/23 2132  WBC 16.6*  NEUTROABS 15.4*  HGB 12.2*  HCT 39.9  MCV 90.7  PLT 300   Cardiac Enzymes: No results for input(s): "CKTOTAL", "CKMB", "CKMBINDEX", "TROPONINI" in the last 168 hours.  BNP (last 3 results) Recent Labs    07/27/22 1500 04/16/23 2130  BNP 232.0* 170.0*    ProBNP (last 3 results) No results for input(s): "PROBNP" in the last 8760 hours.  CBG: No results for input(s): "GLUCAP" in the last 168 hours.  Radiological Exams on Admission: DG Chest Port 1 View Result Date: 04/16/2023 CLINICAL DATA:  Questionable sepsis EXAM: PORTABLE CHEST 1 VIEW COMPARISON:  01/07/2023 FINDINGS: Heart normal size. Aortic atherosclerosis. Patchy bilateral airspace disease, right greater than left. Possible small right effusion. No acute bony abnormality. IMPRESSION: Patchy bilateral airspace disease, right greater than left concerning for pneumonia. Electronically Signed   By: Charlett Nose M.D.   On: 04/16/2023 22:14    EKG: I independently viewed the EKG done and my findings are as followed: Normal sinus rhythm at rate of 91 bpm  Assessment/Plan Present on Admission:  CAP (community acquired pneumonia)  Acute on chronic respiratory failure with hypoxia (HCC)  Dementia with behavioral disturbance (HCC)   Pulmonary embolism (HCC)  Essential hypertension  Principal Problem:   CAP (community  acquired pneumonia) Active Problems:   Pulmonary embolism (HCC)   Essential hypertension   Dementia with behavioral disturbance (HCC)   Acute on chronic respiratory failure with hypoxia (HCC)   Lactic acidosis   Elevated brain natriuretic peptide (BNP) level   Hypokalemia   Hypoalbuminemia due to protein-calorie malnutrition (HCC)   COPD (chronic obstructive pulmonary disease) (HCC)  CAP POA Acute on chronic respiratory failure with hypoxia Patient was started on ceftriaxone and azithromycin, we shall continue same at this time with plan to de-escalate/discontinue based on blood culture, sputum culture, urine Legionella, strep pneumo  Continue Tylenol as needed Continue Mucinex, incentive spirometry, flutter valve Continue supplemental oxygen to maintain O2 sats > 92% with plan to wean patient down to home oxygen level as tolerated  Lactic acidosis possibly due to above Lactic acid 2.1 > 2.5 > 2.8 Continue to trend lactic acid  Elevated BNP BNP at 170, this was 232 about 8 months ago Chest x-ray showed no blurring of costophrenic angle or pulmonary edema Continue total input/output, daily weights and fluid restriction Continue heart healthy diet   Hypokalemia K+ 3.1, this will be replenished  Hypoalbuminemia possible secondary to severe protein calorie malnutrition Failure to thrive in adult Protein supplement will be provided Dietitian will be consulted admission await further recommendations  ?  Hypocalcemia Corrected calcium level for albumin is 9.1  Pulmonary embolism  History of PE.  Resume Eliquis  COPD (not in acute exacerbation) Chronic respiratory failure with hypoxia Continue Dulera, albuterol Continue supplemental oxygen to maintain O2 sats greater than 92%   Dementia with behavioral disturbance  Baseline dementia, and Parkinson's disease. Continue memantine and  Sinemet No behavioral abnormalities.    Essential hypertension BP meds will be held at this time due to soft BP   Diabetes mellitus (HCC)/ insulin dependent Hemoglobin A1c about 3 months ago was 7.0 Continue ISS and hypoglycemia protocol     DVT prophylaxis: Eliquis  Code Status: Full code  Family Communication: Wife at bedside (all questions answered to satisfaction)  Consults: Dietitian  Severity of Illness: The appropriate patient status for this patient is INPATIENT. Inpatient status is judged to be reasonable and necessary in order to provide the required intensity of service to ensure the patient's safety. The patient's presenting symptoms, physical exam findings, and initial radiographic and laboratory data in the context of their chronic comorbidities is felt to place them at high risk for further clinical deterioration. Furthermore, it is not anticipated that the patient will be medically stable for discharge from the hospital within 2 midnights of admission.   * I certify that at the point of admission it is my clinical judgment that the patient will require inpatient hospital care spanning beyond 2 midnights from the point of admission due to high intensity of service, high risk for further deterioration and high frequency of surveillance required.*  Author: Frankey Shown, DO 04/17/2023 5:59 AM  For on call review [email or web address].

## 2023-04-17 DIAGNOSIS — J189 Pneumonia, unspecified organism: Secondary | ICD-10-CM

## 2023-04-17 DIAGNOSIS — J440 Chronic obstructive pulmonary disease with acute lower respiratory infection: Secondary | ICD-10-CM | POA: Diagnosis not present

## 2023-04-17 DIAGNOSIS — I10 Essential (primary) hypertension: Secondary | ICD-10-CM

## 2023-04-17 DIAGNOSIS — E8809 Other disorders of plasma-protein metabolism, not elsewhere classified: Secondary | ICD-10-CM | POA: Insufficient documentation

## 2023-04-17 DIAGNOSIS — R7989 Other specified abnormal findings of blood chemistry: Secondary | ICD-10-CM | POA: Diagnosis not present

## 2023-04-17 DIAGNOSIS — E872 Acidosis, unspecified: Secondary | ICD-10-CM | POA: Insufficient documentation

## 2023-04-17 DIAGNOSIS — E876 Hypokalemia: Secondary | ICD-10-CM | POA: Diagnosis not present

## 2023-04-17 DIAGNOSIS — F03918 Unspecified dementia, unspecified severity, with other behavioral disturbance: Secondary | ICD-10-CM

## 2023-04-17 DIAGNOSIS — J449 Chronic obstructive pulmonary disease, unspecified: Secondary | ICD-10-CM | POA: Insufficient documentation

## 2023-04-17 DIAGNOSIS — J9621 Acute and chronic respiratory failure with hypoxia: Secondary | ICD-10-CM

## 2023-04-17 DIAGNOSIS — E46 Unspecified protein-calorie malnutrition: Secondary | ICD-10-CM

## 2023-04-17 LAB — RESP PANEL BY RT-PCR (RSV, FLU A&B, COVID)  RVPGX2
Influenza A by PCR: NEGATIVE
Influenza B by PCR: NEGATIVE
Resp Syncytial Virus by PCR: NEGATIVE
SARS Coronavirus 2 by RT PCR: NEGATIVE

## 2023-04-17 LAB — RESPIRATORY PANEL BY PCR

## 2023-04-17 LAB — COMPREHENSIVE METABOLIC PANEL
ALT: 10 U/L (ref 0–44)
AST: 13 U/L — ABNORMAL LOW (ref 15–41)
Albumin: <1.5 g/dL — ABNORMAL LOW (ref 3.5–5.0)
Alkaline Phosphatase: 134 U/L — ABNORMAL HIGH (ref 38–126)
Anion gap: 14 (ref 5–15)
BUN: 32 mg/dL — ABNORMAL HIGH (ref 8–23)
CO2: 19 mmol/L — ABNORMAL LOW (ref 22–32)
Calcium: 7.5 mg/dL — ABNORMAL LOW (ref 8.9–10.3)
Chloride: 110 mmol/L (ref 98–111)
Creatinine, Ser: 0.98 mg/dL (ref 0.61–1.24)
GFR, Estimated: >60 mL/min (ref >60)
Glucose, Bld: 139 mg/dL — ABNORMAL HIGH (ref 70–99)
Potassium: 2.7 mmol/L — CL (ref 3.5–5.1)
Sodium: 143 mmol/L (ref 135–145)
Total Bilirubin: 1.2 mg/dL (ref 0.0–1.2)
Total Protein: 4.6 g/dL — ABNORMAL LOW (ref 6.5–8.1)

## 2023-04-17 LAB — GLUCOSE, CAPILLARY
Glucose-Capillary: 122 mg/dL — ABNORMAL HIGH (ref 70–99)
Glucose-Capillary: 125 mg/dL — ABNORMAL HIGH (ref 70–99)
Glucose-Capillary: 127 mg/dL — ABNORMAL HIGH (ref 70–99)

## 2023-04-17 LAB — CBC
HCT: 37.9 % — ABNORMAL LOW (ref 39.0–52.0)
Hemoglobin: 11.7 g/dL — ABNORMAL LOW (ref 13.0–17.0)
MCH: 28.1 pg (ref 26.0–34.0)
MCHC: 30.9 g/dL (ref 30.0–36.0)
MCV: 90.9 fL (ref 80.0–100.0)
Platelets: 340 10*3/uL (ref 150–400)
RBC: 4.17 MIL/uL — ABNORMAL LOW (ref 4.22–5.81)
RDW: 19.1 % — ABNORMAL HIGH (ref 11.5–15.5)
WBC: 19.1 10*3/uL — ABNORMAL HIGH (ref 4.0–10.5)
nRBC: 0 % (ref 0.0–0.2)

## 2023-04-17 LAB — PROCALCITONIN: Procalcitonin: 3.66 ng/mL

## 2023-04-17 LAB — CBG MONITORING, ED
Glucose-Capillary: 130 mg/dL — ABNORMAL HIGH (ref 70–99)
Glucose-Capillary: 117 mg/dL — ABNORMAL HIGH (ref 70–99)

## 2023-04-17 LAB — STREP PNEUMONIAE URINARY ANTIGEN: Strep Pneumo Urinary Antigen: NEGATIVE

## 2023-04-17 LAB — LACTIC ACID, PLASMA
Lactic Acid, Venous: 2.5 mmol/L (ref 0.5–1.9)
Lactic Acid, Venous: 2.8 mmol/L (ref 0.5–1.9)
Lactic Acid, Venous: 1.7 mmol/L (ref 0.5–1.9)

## 2023-04-17 LAB — MAGNESIUM: Magnesium: 1.7 mg/dL (ref 1.7–2.4)

## 2023-04-17 MED ORDER — LACTATED RINGERS IV BOLUS
500.0000 mL | Freq: Once | INTRAVENOUS | Status: AC
  Administered 2023-04-17: 500 mL via INTRAVENOUS

## 2023-04-17 MED ORDER — MIDODRINE HCL 5 MG PO TABS
5.0000 mg | ORAL_TABLET | Freq: Three times a day (TID) | ORAL | Status: DC
  Administered 2023-04-17 – 2023-04-18 (×2): 5 mg via ORAL
  Filled 2023-04-17 (×2): qty 1

## 2023-04-17 MED ORDER — MAGNESIUM SULFATE 2 GM/50ML IV SOLN
2.0000 g | Freq: Once | INTRAVENOUS | Status: AC
  Administered 2023-04-17: 2 g via INTRAVENOUS
  Filled 2023-04-17: qty 50

## 2023-04-17 MED ORDER — ACETAMINOPHEN 650 MG RE SUPP
650.0000 mg | Freq: Four times a day (QID) | RECTAL | Status: DC | PRN
  Administered 2023-04-18: 650 mg via RECTAL
  Filled 2023-04-17: qty 1

## 2023-04-17 MED ORDER — LACTATED RINGERS IV SOLN: INTRAVENOUS | Status: AC

## 2023-04-17 MED ORDER — SODIUM CHLORIDE 0.9 % IV SOLN
2.0000 g | INTRAVENOUS | Status: DC
  Administered 2023-04-17: 2 g via INTRAVENOUS
  Filled 2023-04-17: qty 20

## 2023-04-17 MED ORDER — HYDROCORTISONE SOD SUC (PF) 100 MG IJ SOLR
100.0000 mg | Freq: Once | INTRAMUSCULAR | Status: AC
  Administered 2023-04-17: 100 mg via INTRAVENOUS
  Filled 2023-04-17: qty 2

## 2023-04-17 MED ORDER — ALBUTEROL SULFATE (2.5 MG/3ML) 0.083% IN NEBU: 2.5000 mg | INHALATION_SOLUTION | Freq: Four times a day (QID) | RESPIRATORY_TRACT | Status: DC | PRN

## 2023-04-17 MED ORDER — LACTATED RINGERS IV BOLUS
1000.0000 mL | Freq: Once | INTRAVENOUS | Status: AC
  Administered 2023-04-17: 1000 mL via INTRAVENOUS

## 2023-04-17 MED ORDER — BACLOFEN 10 MG PO TABS
5.0000 mg | ORAL_TABLET | Freq: Two times a day (BID) | ORAL | Status: DC
  Administered 2023-04-18: 5 mg via ORAL
  Filled 2023-04-17 (×2): qty 1

## 2023-04-17 MED ORDER — ONDANSETRON HCL 4 MG PO TABS: 4.0000 mg | ORAL_TABLET | Freq: Four times a day (QID) | ORAL | Status: DC | PRN

## 2023-04-17 MED ORDER — CHLORHEXIDINE GLUCONATE CLOTH 2 % EX PADS
6.0000 | MEDICATED_PAD | Freq: Every day | CUTANEOUS | Status: DC
Start: 2023-04-17 — End: 2023-04-18
  Administered 2023-04-17: 6 via TOPICAL

## 2023-04-17 MED ORDER — ATORVASTATIN CALCIUM 40 MG PO TABS
40.0000 mg | ORAL_TABLET | Freq: Every day | ORAL | Status: DC
  Filled 2023-04-17: qty 1

## 2023-04-17 MED ORDER — APIXABAN 5 MG PO TABS
5.0000 mg | ORAL_TABLET | Freq: Two times a day (BID) | ORAL | Status: DC
  Filled 2023-04-17: qty 1

## 2023-04-17 MED ORDER — POTASSIUM CHLORIDE CRYS ER 20 MEQ PO TBCR
40.0000 meq | EXTENDED_RELEASE_TABLET | Freq: Once | ORAL | Status: AC
  Administered 2023-04-17: 40 meq via ORAL
  Filled 2023-04-17: qty 2

## 2023-04-17 MED ORDER — PANTOPRAZOLE SODIUM 40 MG PO TBEC
40.0000 mg | DELAYED_RELEASE_TABLET | Freq: Every day | ORAL | Status: DC
Start: 2023-04-17 — End: 2023-04-18
  Administered 2023-04-17 – 2023-04-18 (×2): 40 mg via ORAL
  Filled 2023-04-17 (×2): qty 1

## 2023-04-17 MED ORDER — OXYCODONE-ACETAMINOPHEN 5-325 MG PO TABS
1.0000 | ORAL_TABLET | Freq: Four times a day (QID) | ORAL | Status: DC | PRN
  Administered 2023-04-17: 1 via ORAL
  Filled 2023-04-17: qty 1

## 2023-04-17 MED ORDER — DM-GUAIFENESIN ER 30-600 MG PO TB12
1.0000 | ORAL_TABLET | Freq: Two times a day (BID) | ORAL | Status: DC
Start: 2023-04-17 — End: 2023-04-18
  Administered 2023-04-17 – 2023-04-18 (×2): 1 via ORAL
  Filled 2023-04-17 (×3): qty 1

## 2023-04-17 MED ORDER — GABAPENTIN 300 MG PO CAPS
600.0000 mg | ORAL_CAPSULE | Freq: Two times a day (BID) | ORAL | Status: DC
  Administered 2023-04-17 – 2023-04-18 (×2): 600 mg via ORAL
  Filled 2023-04-17 (×2): qty 2

## 2023-04-17 MED ORDER — SERTRALINE HCL 50 MG PO TABS
100.0000 mg | ORAL_TABLET | Freq: Every morning | ORAL | Status: DC
  Administered 2023-04-17 – 2023-04-18 (×2): 100 mg via ORAL
  Filled 2023-04-17 (×2): qty 2

## 2023-04-17 MED ORDER — POTASSIUM CHLORIDE 2 MEQ/ML IV SOLN
INTRAVENOUS | Status: DC
  Filled 2023-04-17 (×3): qty 1000

## 2023-04-17 MED ORDER — MOMETASONE FURO-FORMOTEROL FUM 100-5 MCG/ACT IN AERO
2.0000 | INHALATION_SPRAY | Freq: Two times a day (BID) | RESPIRATORY_TRACT | Status: DC
  Administered 2023-04-17 – 2023-04-18 (×3): 2 via RESPIRATORY_TRACT
  Filled 2023-04-17: qty 8.8

## 2023-04-17 MED ORDER — TAMSULOSIN HCL 0.4 MG PO CAPS
0.4000 mg | ORAL_CAPSULE | Freq: Every day | ORAL | Status: DC
  Filled 2023-04-17: qty 1

## 2023-04-17 MED ORDER — KCL-LACTATED RINGERS 20 MEQ/L IV SOLN
INTRAVENOUS | Status: DC
  Filled 2023-04-17 (×2): qty 1000

## 2023-04-17 MED ORDER — OXYCODONE HCL 5 MG PO TABS
5.0000 mg | ORAL_TABLET | Freq: Four times a day (QID) | ORAL | Status: DC | PRN
  Administered 2023-04-17: 5 mg via ORAL
  Filled 2023-04-17: qty 1

## 2023-04-17 MED ORDER — BACLOFEN 10 MG PO TABS
10.0000 mg | ORAL_TABLET | Freq: Two times a day (BID) | ORAL | Status: DC
  Administered 2023-04-17: 10 mg via ORAL
  Filled 2023-04-17: qty 1

## 2023-04-17 MED ORDER — SODIUM CHLORIDE 0.9 % IV SOLN
500.0000 mg | INTRAVENOUS | Status: DC
  Administered 2023-04-17: 500 mg via INTRAVENOUS
  Filled 2023-04-17: qty 5

## 2023-04-17 MED ORDER — INSULIN ASPART 100 UNIT/ML IJ SOLN
0.0000 [IU] | Freq: Three times a day (TID) | INTRAMUSCULAR | Status: DC
  Administered 2023-04-17 (×2): 1 [IU] via SUBCUTANEOUS
  Administered 2023-04-18: 2 [IU] via SUBCUTANEOUS
  Filled 2023-04-17: qty 1

## 2023-04-17 MED ORDER — BACLOFEN 10 MG PO TABS: 10.0000 mg | ORAL_TABLET | Freq: Every day | ORAL | Status: DC

## 2023-04-17 MED ORDER — ACETAMINOPHEN 325 MG PO TABS: 650.0000 mg | ORAL_TABLET | Freq: Four times a day (QID) | ORAL | Status: DC | PRN

## 2023-04-17 MED ORDER — ONDANSETRON HCL 4 MG/2ML IJ SOLN
4.0000 mg | Freq: Four times a day (QID) | INTRAMUSCULAR | Status: DC | PRN
  Administered 2023-04-17: 4 mg via INTRAVENOUS
  Filled 2023-04-17: qty 2

## 2023-04-17 MED ORDER — OXYCODONE-ACETAMINOPHEN 10-325 MG PO TABS: 1.0000 | ORAL_TABLET | Freq: Four times a day (QID) | ORAL | Status: DC | PRN

## 2023-04-17 MED ORDER — ENSURE ENLIVE PO LIQD
237.0000 mL | Freq: Two times a day (BID) | ORAL | Status: DC
Start: 2023-04-17 — End: 2023-04-18
  Administered 2023-04-17: 237 mL via ORAL
  Filled 2023-04-17 (×6): qty 237

## 2023-04-17 MED ORDER — CARBIDOPA-LEVODOPA 25-100 MG PO TABS
2.0000 | ORAL_TABLET | Freq: Three times a day (TID) | ORAL | Status: DC
  Administered 2023-04-17 – 2023-04-18 (×3): 2 via ORAL
  Filled 2023-04-17 (×4): qty 2

## 2023-04-17 MED ORDER — ZOLPIDEM TARTRATE 5 MG PO TABS
5.0000 mg | ORAL_TABLET | Freq: Every day | ORAL | Status: DC
  Filled 2023-04-17: qty 1

## 2023-04-17 MED ORDER — MEMANTINE HCL 10 MG PO TABS
20.0000 mg | ORAL_TABLET | Freq: Every day | ORAL | Status: DC
  Filled 2023-04-17: qty 2

## 2023-04-17 MED ORDER — HYDROCORTISONE SOD SUC (PF) 100 MG IJ SOLR
50.0000 mg | Freq: Three times a day (TID) | INTRAMUSCULAR | Status: DC
  Administered 2023-04-18 (×2): 50 mg via INTRAVENOUS
  Filled 2023-04-17 (×2): qty 2

## 2023-04-17 MED ORDER — ASPIRIN 81 MG PO CHEW
81.0000 mg | CHEWABLE_TABLET | Freq: Every day | ORAL | Status: DC
  Administered 2023-04-17 – 2023-04-18 (×2): 81 mg via ORAL
  Filled 2023-04-17 (×2): qty 1

## 2023-04-17 MED ORDER — GALANTAMINE HYDROBROMIDE ER 8 MG PO CP24
24.0000 mg | ORAL_CAPSULE | Freq: Every day | ORAL | Status: DC
  Filled 2023-04-17 (×4): qty 3

## 2023-04-17 MED ORDER — POTASSIUM CHLORIDE 20 MEQ PO PACK
40.0000 meq | PACK | ORAL | Status: AC
  Administered 2023-04-17 (×2): 40 meq via ORAL
  Filled 2023-04-17 (×2): qty 2

## 2023-04-17 MED ORDER — ENOXAPARIN SODIUM 40 MG/0.4ML IJ SOSY: 40.0000 mg | PREFILLED_SYRINGE | INTRAMUSCULAR | Status: DC

## 2023-04-17 NOTE — Evaluation (Signed)
Clinical/Bedside Swallow Evaluation Patient Details  Name: Aaron Leon MRN: [id number] Date of Birth: 11/01/1950  Today's Date: 04/17/2023 Time: SLP Start Time (ACUTE ONLY): 1440 SLP Stop Time (ACUTE ONLY): 1504 SLP Time Calculation (min) (ACUTE ONLY): 24 min  Past Medical History:  Past Medical History:  Diagnosis Date   COPD (chronic obstructive pulmonary disease) (HCC)    Diabetes mellitus without complication (HCC)    "type 2"   H/O septic shock    Hypertension    Pulmonary embolism (HCC)    Stroke (HCC)    Past Surgical History:  Past Surgical History:  Procedure Laterality Date   BACK SURGERY     CATARACT EXTRACTION     CERVICAL FUSION     HIP PINNING Left    03/2022   KNEE SURGERY     TOTAL HIP ARTHROPLASTY Right 02/2021   HPI:  73 year old M with PMH of COPD/chronic hypoxic RF on 3 L, CVA, IDDM-2, chronic pain, Parkinson disease, dementia and PE not on AC brought to ED by EMS due to progressive SOB, hypotension, hypoxemia and decreased p.o. intake, and admitted with working diagnosis of community-acquired pneumonia, hypothermia and lactic acidosis.  Reportedly had decreased p.o. intake for about a week or 2.  He was also hypotensive to 70s/50s and hypoxic to 80s that prompted patient's wife to activate EMS.       In ED, hypothermic to 95.  SBP as low as 80.  DBP as low as 44.  Saturating at 94% on 5 L.  WBC 16.6 with left shift.  Lactic acid 2.1>>> 2.8.  CXR with patchy bilateral airspace disease, right > left concerning for pneumonia.  Cultures obtained.  Patient was started on IV fluid, CTX, Zithromax and potassium supplementation. BSE requested. Pt had MBSS 09/2022 and BSE 12/2022.    Assessment / Plan / Recommendation  Clinical Impression  Pt known to SLP service from previous admission and BSE completed in October 2024. Pt has PD and takes his medications for that 3x/day per wife and she cannot consistently get him to take them. She reports a dramatic decrease in PO  intake over the past several weeks. She indicates that she doesn't think he has trouble swallowing, but has reduced interest in PO intake. Pt required cuing from SLP and then we transitioned to having spouse give him PO as he accepted from her more efficiently. Pt slow to respond and exhibits oral holding and suspected delay in oral transit. He required verbal cues to "swallow" and did with delays. Pt consumed small amounts of water via straw, puree, and a couple of bites of peaches. Wife indicates that Pt would not want a feeding tube. SLP provided education regarding feeding tubes being a good source for extra nutrition, but does not prevent aspiration and may not improve quality of life. Wife appreciated offering PO to spouse and encouraging intake when alert, but not forcing PO when not alert/not wanting. He typically takes his medication whole with water per spouse, but recommend trying whole in puree and follow with liquid wash. Recommend D2 and thin liquids and Pt will need feeder assist and assist with oral care (swabs left in the room by SLP). Above information dicussed with Pt (as able), spouse, and RN. Consider offering Magic Cups on trays (he does not like chocolate). SLP will follow per goals of care. Palliative consult pending per RN. SLP Visit Diagnosis: Dysphagia, unspecified (R13.10)    Aspiration Risk  Mild aspiration risk;Risk for inadequate nutrition/hydration  Diet Recommendation Dysphagia 2 (Fine chop);Thin liquid    Liquid Administration via: Cup;Straw Medication Administration: Whole meds with puree Supervision: Staff to assist with self feeding;Full supervision/cueing for compensatory strategies Compensations: Slow rate;Small sips/bites;Lingual sweep for clearance of pocketing Postural Changes: Seated upright at 90 degrees;Remain upright for at least 30 minutes after po intake    Other  Recommendations Oral Care Recommendations: Oral care before and after PO;Staff/trained  caregiver to provide oral care    Recommendations for follow up therapy are one component of a multi-disciplinary discharge planning process, led by the attending physician.  Recommendations may be updated based on patient status, additional functional criteria and insurance authorization.  Follow up Recommendations Follow physician's recommendations for discharge plan and follow up therapies      Assistance Recommended at Discharge    Functional Status Assessment Patient has had a recent decline in their functional status and demonstrates the ability to make significant improvements in function in a reasonable and predictable amount of time.  Frequency and Duration min 2x/week  1 week       Prognosis Prognosis for improved oropharyngeal function: Fair Barriers to Reach Goals: Motivation      Swallow Study   General Date of Onset: 04/16/23 HPI: 73 year old M with PMH of COPD/chronic hypoxic RF on 3 L, CVA, IDDM-2, chronic pain, Parkinson disease, dementia and PE not on AC brought to ED by EMS due to progressive SOB, hypotension, hypoxemia and decreased p.o. intake, and admitted with working diagnosis of community-acquired pneumonia, hypothermia and lactic acidosis.  Reportedly had decreased p.o. intake for about a week or 2.  He was also hypotensive to 70s/50s and hypoxic to 80s that prompted patient's wife to activate EMS.       In ED, hypothermic to 95.  SBP as low as 80.  DBP as low as 44.  Saturating at 94% on 5 L.  WBC 16.6 with left shift.  Lactic acid 2.1>>> 2.8.  CXR with patchy bilateral airspace disease, right > left concerning for pneumonia.  Cultures obtained.  Patient was started on IV fluid, CTX, Zithromax and potassium supplementation. BSE requested. Pt had MBSS 09/2022 and BSE 12/2022. Type of Study: Bedside Swallow Evaluation Previous Swallow Assessment: MBS 09/2022 D3/thin, BSE 12/2022 reg/thin Diet Prior to this Study: Regular;Thin liquids (Level 0) Temperature Spikes  Noted: No Respiratory Status: Room air History of Recent Intubation: No Behavior/Cognition: Alert;Cooperative;Pleasant mood;Requires cueing Oral Cavity Assessment: Within Functional Limits Oral Care Completed by SLP: Recent completion by staff Oral Cavity - Dentition: Dentures, top;Dentures, bottom Vision: Impaired for self-feeding Self-Feeding Abilities: Total assist Patient Positioning: Upright in bed Baseline Vocal Quality: Low vocal intensity Volitional Cough: Weak Volitional Swallow: Able to elicit    Oral/Motor/Sensory Function Overall Oral Motor/Sensory Function: Generalized oral weakness   Ice Chips Ice chips: Within functional limits Presentation: Spoon   Thin Liquid Thin Liquid: Impaired Presentation: Spoon;Straw Oral Phase Impairments: Poor awareness of bolus    Nectar Thick Nectar Thick Liquid: Not tested   Honey Thick Honey Thick Liquid: Not tested   Puree Puree: Within functional limits Presentation: Spoon   Solid     Solid: Impaired Presentation: Spoon Oral Phase Impairments: Impaired mastication;Poor awareness of bolus Oral Phase Functional Implications: Prolonged oral transit;Oral holding     Thank you,  Havery Moros, CCC-SLP [phone]  Kalden Wanke 04/17/2023,3:18 PM

## 2023-04-17 NOTE — Progress Notes (Signed)
PROGRESS NOTE  Aaron Leon WUX:[id number] DOB: 12/20/1950   PCP: [name]  Patient is from: Home.  Lives with his wife.  Uses wheelchair for mobility.  Dependent for most ADLs.  DOA: 04/16/2023 LOS: 1  Chief complaints Chief Complaint  Patient presents with   Hypotension   Failure To Thrive     Brief Narrative / Interim history: 73 year old M with PMH of COPD/chronic hypoxic RF on 3 L, CVA, IDDM-2, chronic pain, Parkinson disease, dementia and PE not on AC brought to ED by EMS due to progressive SOB, hypotension, hypoxemia and decreased p.o. intake, and admitted with working diagnosis of community-acquired pneumonia, hypothermia and lactic acidosis.  Reportedly had decreased p.o. intake for about a week or 2.  He was also hypotensive to 70s/50s and hypoxic to 80s that prompted patient's wife to activate EMS.    In ED, hypothermic to 95.  SBP as low as 80.  DBP as low as 44.  Saturating at 94% on 5 L.  WBC 16.6 with left shift.  Lactic acid 2.1>>> 2.8.  CXR with patchy bilateral airspace disease, right > left concerning for pneumonia.  Cultures obtained.  Patient was started on IV fluid, CTX, Zithromax and potassium supplementation.  Subjective: Seen and examined earlier this morning.  Patient's wife at bedside and feeding patient.  Patient has no complaints but not a great historian.  He is awake and alert but only oriented to self, person and "hospital".  Per wife, he looks better today.  He is taking some fluid.  Does not appear to be short of breath.  Intermittently coughing.  Discussed goal of care focus on CODE STATUS.  Discussed pros and cons of CPR or intubation.  Per wife, he previously expressed his desire for no resuscitation or intubation.  Patient's wife likes to honor his wishes.  Objective: Vitals:   04/17/23 0800 04/17/23 0830 04/17/23 0900 04/17/23 0921  BP: 99/63 101/63 109/66   Pulse: 100 99 (!) 103   Resp: (!) 21 14 19    Temp:      TempSrc:       SpO2: 92% 92% 92% (!) 89%  Weight:        Examination:  GENERAL: No apparent distress.  Nontoxic. HEENT: MMM.  Vision and hearing grossly intact.  NECK: Supple.  No apparent JVD.  RESP:  No IWOB.  Fair aeration bilaterally.  Rhonchi. CVS:  RRR. Heart sounds normal.  ABD/GI/GU: BS+. Abd soft, NTND.  MSK/EXT:  Moves extremities.  Significant muscle mass and subcu fat loss. SKIN: no apparent skin lesion or wound NEURO: Awake and alert.  Oriented to self, wife and "hospital".  No apparent focal neuro deficit.  B/l BLE weakness. PSYCH: Calm. Normal affect.   Procedures:  None  Microbiology summarized: Blood cultures NGTD RVP pending.  Assessment and plan: Acute on chronic respiratory failure with hypoxia due to community-acquired pneumonia: Presents with progressive shortness of breath, cough, hypoxemia and hypotension.  Patient is on 3 L at baseline.  CXR with bibasilar infiltrate, right > left.  Has leukocytosis with left shift.  Pro-Cal elevated to 3.66.  Blood cultures NGTD -Continue ceftriaxone and Zithromax -Follow RVP, sputum culture, urine Legionella and strep pneumo -Continue Mucinex, incentive spirometry, flutter valve -Wean oxygen as able. -Aspiration precaution.  SLP eval.  Chronic COPD: Doubt exacerbation. -Continue Dulera and albuterol -Antibiotics as above. -Hold off steroid   Parkinson's disease/dementia without behavioral disturbance: Stable.  Awake and alert but only oriented to self, wife and "  hospital". -Continue home medications -Reorientation and delirium precautions  Hypotension/history of essential hypertension: Reportedly hypotensive to 70s/50s at home.  SBP in 80s with DBP in 40s in ED. on low-dose lisinopril, metoprolol, Lasix and Flomax at home.  He is also on baclofen and oral oxycodone which could contribute.  Hypotension resolved after IV fluid resuscitation. -Continue holding home diuretics/antihypertensive meds.  IDDM-2 with hyperglycemia: A1c  7.0% in 12/2022.  Seems to be on insulin and metformin at home. Recent Labs  Lab 04/17/23 0900  GLUCAP 130*  -Hold home metformin -Continue SSI-sensitive  Hypokalemia/hypomagnesemia -Monitor replenish as appropriate  Lactic acidosis possibly due to above: Resolved.   History of pulmonary embolism: Per wife, no longer and Eliquis. -Discontinued Eliquis  History of CVA: seems full dose aspirin and statin. -Continue statin -Low-dose aspirin  Chronic pain/insomnia: On Percocet, baclofen, gabapentin and Ambien.  Definitely at risk for polypharmacy. -Resume home medications.  Decreased baclofen, gabapentin and Ambien -Discussed about risk of polypharmacy with patient's wife.   Ambulatory dysfunction/wheelchair dependence -PT/OT eval to assess ability to transfer  Advance care planning: Changed CODE STATUS to DNR/DNI after discussion with patient's wife.  Decreased oral intake Body mass index is 19.67 kg/m. -Dietitian consulted.   DVT prophylaxis:  SCDs Start: 04/17/23 0533 apixaban (ELIQUIS) tablet 5 mg  Code Status: DNR/DNI Family Communication: Updated patient's wife at bedside Level of care: Stepdown Status is: Inpatient Remains inpatient appropriate because: Respiratory failure, pneumonia and hypotension   Final disposition: To be determined Consultants:  None  55 minutes with more than 50% spent in reviewing records, counseling patient/family and coordinating care.   Sch Meds:  Scheduled Meds:  apixaban  5 mg Oral BID   atorvastatin  40 mg Oral QHS   baclofen  10 mg Oral BID   carbidopa-levodopa  2 tablet Oral TID   dextromethorphan-guaiFENesin  1 tablet Oral BID   feeding supplement  237 mL Oral BID BM   insulin aspart  0-9 Units Subcutaneous TID WC   mometasone-formoterol  2 puff Inhalation BID   pantoprazole  40 mg Oral Daily   potassium chloride  40 mEq Oral Q4H   Continuous Infusions:  azithromycin     cefTRIAXone (ROCEPHIN)  IV     magnesium  sulfate bolus IVPB     PRN Meds:.acetaminophen **OR** acetaminophen, albuterol, ondansetron **OR** ondansetron (ZOFRAN) IV, oxyCODONE-acetaminophen **AND** oxyCODONE  Antimicrobials: Anti-infectives (From admission, onward)    Start     Dose/Rate Route Frequency Ordered Stop   04/17/23 2200  cefTRIAXone (ROCEPHIN) 2 g in sodium chloride 0.9 % 100 mL IVPB        2 g 200 mL/hr over 30 Minutes Intravenous Every 24 hours 04/17/23 0534 04/22/23 2159   04/17/23 2200  azithromycin (ZITHROMAX) 500 mg in sodium chloride 0.9 % 250 mL IVPB        500 mg 250 mL/hr over 60 Minutes Intravenous Every 24 hours 04/17/23 0534 04/22/23 2159   04/16/23 2115  cefTRIAXone (ROCEPHIN) 2 g in sodium chloride 0.9 % 100 mL IVPB        2 g 200 mL/hr over 30 Minutes Intravenous Once 04/16/23 2109 04/16/23 2218   04/16/23 2115  azithromycin (ZITHROMAX) 500 mg in sodium chloride 0.9 % 250 mL IVPB        500 mg 250 mL/hr over 60 Minutes Intravenous  Once 04/16/23 2109 04/16/23 2320        I have personally reviewed the following labs and images: CBC: Recent Labs  Lab 04/16/23  2132 04/17/23 0858  WBC 16.6* 19.1*  NEUTROABS 15.4*  --   HGB 12.2* 11.7*  HCT 39.9 37.9*  MCV 90.7 90.9  PLT 300 340   BMP &GFR Recent Labs  Lab 04/16/23 2132 04/17/23 0858  NA 142 143  K 3.1* 2.7*  CL 105 110  CO2 25 19*  GLUCOSE 139* 139*  BUN 33* 32*  CREATININE 1.13 0.98  CALCIUM 7.5* 7.5*  MG  --  1.7   Estimated Creatinine Clearance: 61.5 mL/min (by C-G formula based on SCr of 0.98 mg/dL). Liver & Pancreas: Recent Labs  Lab 04/16/23 2132 04/17/23 0858  AST 15 13*  ALT 9 10  ALKPHOS 143* 134*  BILITOT 0.8 1.2  PROT 4.9* 4.6*  ALBUMIN 1.6* <1.5*   No results for input(s): "LIPASE", "AMYLASE" in the last 168 hours. No results for input(s): "AMMONIA" in the last 168 hours. Diabetic: No results for input(s): "HGBA1C" in the last 72 hours. Recent Labs  Lab 04/17/23 0900  GLUCAP 130*   Cardiac  Enzymes: No results for input(s): "CKTOTAL", "CKMB", "CKMBINDEX", "TROPONINI" in the last 168 hours. No results for input(s): "PROBNP" in the last 8760 hours. Coagulation Profile: Recent Labs  Lab 04/16/23 2132  INR 1.2   Thyroid Function Tests: No results for input(s): "TSH", "T4TOTAL", "FREET4", "T3FREE", "THYROIDAB" in the last 72 hours. Lipid Profile: No results for input(s): "CHOL", "HDL", "LDLCALC", "TRIG", "CHOLHDL", "LDLDIRECT" in the last 72 hours. Anemia Panel: No results for input(s): "VITAMINB12", "FOLATE", "FERRITIN", "TIBC", "IRON", "RETICCTPCT" in the last 72 hours. Urine analysis:    Component Value Date/Time   COLORURINE YELLOW 01/07/2023 1556   APPEARANCEUR HAZY (A) 01/07/2023 1556   LABSPEC 1.025 01/07/2023 1556   PHURINE 5.0 01/07/2023 1556   GLUCOSEU 150 (A) 01/07/2023 1556   HGBUR SMALL (A) 01/07/2023 1556   BILIRUBINUR NEGATIVE 01/07/2023 1556   KETONESUR 20 (A) 01/07/2023 1556   PROTEINUR >=300 (A) 01/07/2023 1556   NITRITE NEGATIVE 01/07/2023 1556   LEUKOCYTESUR NEGATIVE 01/07/2023 1556   Sepsis Labs: Invalid input(s): "PROCALCITONIN", "LACTICIDVEN"  Microbiology: Recent Results (from the past 240 hours)  Blood Culture (routine x 2)     Status: None (Preliminary result)   Collection Time: 04/16/23  9:32 PM   Specimen: BLOOD RIGHT FOREARM  Result Value Ref Range Status   Specimen Description BLOOD RIGHT FOREARM BOTTLES DRAWN AEROBIC ONLY  Final   Special Requests   Final    Blood Culture results may not be optimal due to an inadequate volume of blood received in culture bottles   Culture   Final    NO GROWTH < 12 HOURS Performed at [facility], [address] [place], [place] [address]    Report Status PENDING  Incomplete  Blood Culture (routine x 2)     Status: None (Preliminary result)   Collection Time: 04/16/23  9:32 PM   Specimen: Right Antecubital; Blood  Result Value Ref Range Status   Specimen Description   Final    RIGHT  ANTECUBITAL BOTTLES DRAWN AEROBIC AND ANAEROBIC   Special Requests Blood Culture adequate volume  Final   Culture   Final    NO GROWTH < 12 HOURS Performed at [facility], [address], [place], [place] [address]    Report Status PENDING  Incomplete    Radiology Studies: DG Chest Port 1 View Result Date: 04/16/2023 CLINICAL DATA:  Questionable sepsis EXAM: PORTABLE CHEST 1 VIEW COMPARISON:  01/07/2023 FINDINGS: Heart normal size. Aortic atherosclerosis. Patchy bilateral airspace disease,  right greater than left. Possible small right effusion. No acute bony abnormality. IMPRESSION: Patchy bilateral airspace disease, right greater than left concerning for pneumonia. Electronically Signed   By: Charlett Nose M.D.   On: 04/16/2023 22:14      Reubin Bushnell T. Marielena Harvell [facility]  If 7PM-7AM, please contact night-coverage [email or web address] 04/17/2023, 10:31 AM

## 2023-04-17 NOTE — TOC Initial Note (Signed)
Transition of Care [facility]) - Initial/Assessment Note    Patient Details  Name: Aaron Leon MRN: [id number] Date of Birth: [birth year]  Transition of Care [facility]) CM/SW Contact:    Beather Arbour Phone Number: 04/17/2023, 9:21 AM  Clinical Narrative:                 Patient is risk for readmission. Patient was admitted for community acquired pneumonia. CSW spoke with spouse who is at bedside. Spouse states that her and the Home health aide assist with patient needs. The home health aide is through the [place] and comes out 5 x a week. Patient does not drive. Equipment in the home is an Mining engineer wheelchair , walkers, potty chair, grab bars, bed rails, and lift assist chair etc. Spouse states that the plans are for patient to return back home. CSW notified the [place] for patient admission here at the hospital . The notification ID is [phone]. TOC will continue to follow.   Expected Discharge Plan: Home w Home Health Services Barriers to Discharge: Continued Medical Work up   Patient Goals and CMS Choice Patient states their goals for this hospitalization and ongoing recovery are:: return home CMS Medicare.gov Compare Post Acute Care list provided to:: Patient Represenative (must comment) (Spouse [name]) Choice offered to / list presented to : Spouse [place] ownership interest in [facility].provided to:: Spouse    Expected Discharge Plan and Services In-house Referral: Clinical Social Work   Post Acute Care Choice: Horticulturist, commercial, Home Health Living arrangements for the past 2 months: Single Family Home                   DME Agency:  ([facility]     Prior Living Arrangements/Services Living arrangements for the past 2 months: Single Family Home Lives with:: Spouse Patient language and need for interpreter reviewed:: Yes Do you feel safe going back to the place where you live?: Yes      Need for Family Participation in Patient Care: Yes  (Comment) Care giver support system in place?: Yes (comment) Current home services: DME, Homehealth aide Criminal Activity/Legal Involvement Pertinent to Current Situation/Hospitalization: No - Comment as needed  Activities of Daily Living      Permission Sought/Granted      Share Information with NAME: [name]     Permission granted to share info w Relationship: Spouse     Emotional Assessment Appearance:: Appears stated age   Affect (typically observed): Appropriate Orientation: : Oriented to Self, Oriented to Place, Oriented to  Time Alcohol / Substance Use: Not Applicable Psych Involvement: No (comment)  Admission diagnosis:  CAP (community acquired pneumonia) [J18.9] Patient Active Problem List   Diagnosis Date Noted   Lactic acidosis 04/17/2023   Elevated brain natriuretic peptide (BNP) level 04/17/2023   Hypokalemia 04/17/2023   Hypoalbuminemia due to protein-calorie malnutrition (HCC) 04/17/2023   COPD (chronic obstructive pulmonary disease) (HCC) 04/17/2023   Protein-calorie malnutrition, severe 01/09/2023   COVID-19 01/08/2023   Pneumonia due to COVID-19 virus 01/07/2023   Severe sepsis (HCC) 01/07/2023   Acute metabolic encephalopathy 01/07/2023   Acute on chronic respiratory failure with hypoxia (HCC) 07/27/2022   CAP (community acquired pneumonia) 07/27/2022   Pulmonary embolism (HCC) 06/17/2022   Diabetes mellitus (HCC) 06/17/2022   Essential hypertension 06/17/2022   Hyperlipidemia 06/17/2022   Debility 06/17/2022   Dementia with behavioral disturbance (HCC) 06/17/2022    Class: Chronic   Lobar pneumonia (HCC) 06/17/2022   Urinary  retention 06/17/2022   Testicular pain 06/17/2022   History of CVA (cerebrovascular accident) 06/17/2022   PCP:  [name] Pharmacy:   [id number] - [place], [place] - [address] [facility] [address] [place] [address] Phone: [phone] Fax:  [phone]  [facility] - [place], [place] - [address] [place] [address] Phone: [phone] Fax: [phone]     Social Drivers of Health (SDOH) Social History: SDOH Screenings   Food Insecurity: No Food Insecurity (01/08/2023)  Housing: Low Risk  (01/08/2023)  Transportation Needs: No Transportation Needs (01/08/2023)  Utilities: Not At Risk (01/08/2023)  Financial Resource Strain: Medium Risk (06/05/2022)   Received from [facility]  Social Connections: Unknown (08/03/2021)   Received from [facility]  Stress: No Stress Concern Present (06/01/2022)   Received from [facility]  Tobacco Use: Medium Risk (04/16/2023)   SDOH Interventions:     Readmission Risk Interventions    04/17/2023    9:16 AM 01/08/2023    2:01 PM 07/28/2022    5:22 PM  Readmission Risk Prevention Plan  Transportation Screening Complete Complete Complete  PCP or Specialist Appt within 3-5 Days   Complete  HRI or Home Care Consult Complete  Complete  Social Work Consult for Recovery Care Planning/Counseling Complete  Complete  Palliative Care Screening Not Applicable  Not Applicable  Medication Review Oceanographer) Complete Complete Complete  HRI or Home Care Consult  Complete   SW Recovery Care/Counseling Consult  Complete   Palliative Care Screening  Not Applicable   Skilled Nursing Facility  Not Applicable

## 2023-04-17 NOTE — ED Notes (Signed)
Pt is A&O x1. Unable to successfully communicate where, situation, and birthday. Pt was able to communicate his back hurts when he was repositioned stated "ouch my back." Pt is in and out of sleep, responds to touch and stating his name. Family at bedside stated pt has been declining for awhile. Pressure injury on buttocks that has a dressing.

## 2023-04-17 NOTE — Plan of Care (Signed)
  Problem: Clinical Measurements: Goal: Respiratory complications will improve Outcome: Progressing   Problem: Coping: Goal: Level of anxiety will decrease Outcome: Progressing   Problem: Safety: Goal: Ability to remain free from injury will improve Outcome: Progressing   Problem: Respiratory: Goal: Ability to maintain adequate ventilation will improve Outcome: Progressing Goal: Ability to maintain a clear airway will improve Outcome: Progressing

## 2023-04-17 NOTE — Progress Notes (Signed)
OT Cancellation Note  Patient Details Name: Aaron Leon MRN: [id number] DOB: [birth year]   Cancelled Treatment:    Reason Eval/Treat Not Completed: Patient not medically ready. Nurse requested to hold pt evaluation due to blood pressure issues. Will attempt to see pt tomorrow if medically stable and as time permits.   Javen Hinderliter OT, MOT   Danie Chandler 04/17/2023, 3:26 PM

## 2023-04-17 NOTE — Progress Notes (Signed)
Pt wife reports pt ate 2 bites chili mac soup on Tuesday, 04/15/23. She states it takes him 3-4 days to drink a Dr. Reino Kent. He refuses all other fluids by mouth. She states he did not eat at all on 04/14/23 and has not eaten anything by mouth since Tuesday 04/15/23.

## 2023-04-18 ENCOUNTER — Inpatient Hospital Stay (HOSPITAL_COMMUNITY): Payer: No Typology Code available for payment source

## 2023-04-18 ENCOUNTER — Other Ambulatory Visit (HOSPITAL_COMMUNITY): Payer: Self-pay | Admitting: *Deleted

## 2023-04-18 DIAGNOSIS — J440 Chronic obstructive pulmonary disease with acute lower respiratory infection: Secondary | ICD-10-CM | POA: Diagnosis not present

## 2023-04-18 DIAGNOSIS — E876 Hypokalemia: Secondary | ICD-10-CM | POA: Diagnosis not present

## 2023-04-18 DIAGNOSIS — L8990 Pressure ulcer of unspecified site, unspecified stage: Secondary | ICD-10-CM | POA: Insufficient documentation

## 2023-04-18 DIAGNOSIS — R7989 Other specified abnormal findings of blood chemistry: Secondary | ICD-10-CM | POA: Diagnosis not present

## 2023-04-18 DIAGNOSIS — J189 Pneumonia, unspecified organism: Secondary | ICD-10-CM | POA: Diagnosis not present

## 2023-04-18 DIAGNOSIS — Z515 Encounter for palliative care: Secondary | ICD-10-CM

## 2023-04-18 LAB — COMPREHENSIVE METABOLIC PANEL
ALT: <5 U/L (ref 0–44)
AST: 16 U/L (ref 15–41)
Albumin: 1.5 g/dL — ABNORMAL LOW (ref 3.5–5.0)
Alkaline Phosphatase: 134 U/L — ABNORMAL HIGH (ref 38–126)
Anion gap: 15 (ref 5–15)
BUN: 33 mg/dL — ABNORMAL HIGH (ref 8–23)
CO2: 19 mmol/L — ABNORMAL LOW (ref 22–32)
Calcium: 8.1 mg/dL — ABNORMAL LOW (ref 8.9–10.3)
Chloride: 111 mmol/L (ref 98–111)
Creatinine, Ser: 0.89 mg/dL (ref 0.61–1.24)
GFR, Estimated: >60 mL/min (ref >60)
Glucose, Bld: 167 mg/dL — ABNORMAL HIGH (ref 70–99)
Potassium: 4.1 mmol/L (ref 3.5–5.1)
Sodium: 145 mmol/L (ref 135–145)
Total Bilirubin: 0.9 mg/dL (ref 0.0–1.2)
Total Protein: 4.9 g/dL — ABNORMAL LOW (ref 6.5–8.1)

## 2023-04-18 LAB — URINALYSIS, W/ REFLEX TO CULTURE (INFECTION SUSPECTED)
Bilirubin Urine: NEGATIVE
Glucose, UA: 50 mg/dL — AB
Ketones, ur: 5 mg/dL — AB
Nitrite: NEGATIVE
Protein, ur: 30 mg/dL — AB
Specific Gravity, Urine: 1.024 (ref 1.005–1.030)
pH: 5 (ref 5.0–8.0)

## 2023-04-18 LAB — MAGNESIUM: Magnesium: 2 mg/dL (ref 1.7–2.4)

## 2023-04-18 LAB — CBC
HCT: 39.9 % (ref 39.0–52.0)
Hemoglobin: 12 g/dL — ABNORMAL LOW (ref 13.0–17.0)
MCH: 27.5 pg (ref 26.0–34.0)
MCHC: 30.1 g/dL (ref 30.0–36.0)
MCV: 91.3 fL (ref 80.0–100.0)
Platelets: 394 10*3/uL (ref 150–400)
RBC: 4.37 MIL/uL (ref 4.22–5.81)
RDW: 19.2 % — ABNORMAL HIGH (ref 11.5–15.5)
WBC: 23.1 10*3/uL — ABNORMAL HIGH (ref 4.0–10.5)
nRBC: 0 % (ref 0.0–0.2)

## 2023-04-18 LAB — PHOSPHORUS: Phosphorus: 3.1 mg/dL (ref 2.5–4.6)

## 2023-04-18 LAB — TSH: TSH: 1.272 u[IU]/mL (ref 0.350–4.500)

## 2023-04-18 LAB — LEGIONELLA PNEUMOPHILA SEROGP 1 UR AG: L. pneumophila Serogp 1 Ur Ag: NEGATIVE

## 2023-04-18 LAB — GLUCOSE, CAPILLARY
Glucose-Capillary: 152 mg/dL — ABNORMAL HIGH (ref 70–99)
Glucose-Capillary: 151 mg/dL — ABNORMAL HIGH (ref 70–99)

## 2023-04-18 MED ORDER — ONDANSETRON HCL 4 MG/2ML IJ SOLN: 4.0000 mg | Freq: Four times a day (QID) | INTRAMUSCULAR | Status: DC | PRN

## 2023-04-18 MED ORDER — ACETAMINOPHEN 650 MG RE SUPP: 650.0000 mg | Freq: Four times a day (QID) | RECTAL | Status: DC | PRN

## 2023-04-18 MED ORDER — NOREPINEPHRINE 4 MG/250ML-% IV SOLN
2.0000 ug/min | INTRAVENOUS | Status: DC
  Administered 2023-04-18: 2 ug/min via INTRAVENOUS

## 2023-04-18 MED ORDER — GLYCOPYRROLATE 0.2 MG/ML IJ SOLN
0.2000 mg | INTRAMUSCULAR | Status: DC | PRN
  Filled 2023-04-18 (×2): qty 1

## 2023-04-18 MED ORDER — GABAPENTIN 300 MG PO CAPS: 300.0000 mg | ORAL_CAPSULE | Freq: Two times a day (BID) | ORAL | Status: DC

## 2023-04-18 MED ORDER — GLYCOPYRROLATE 0.2 MG/ML IJ SOLN
0.2000 mg | INTRAMUSCULAR | Status: DC | PRN
  Administered 2023-04-18 – 2023-04-19 (×3): 0.2 mg via INTRAVENOUS
  Filled 2023-04-18: qty 1

## 2023-04-18 MED ORDER — LORAZEPAM 2 MG/ML IJ SOLN
1.0000 mg | INTRAMUSCULAR | Status: DC | PRN
  Administered 2023-04-18: 1 mg via INTRAVENOUS
  Filled 2023-04-18: qty 1

## 2023-04-18 MED ORDER — LACTATED RINGERS IV SOLN: INTRAVENOUS | Status: DC

## 2023-04-18 MED ORDER — LORAZEPAM 1 MG PO TABS: 1.0000 mg | ORAL_TABLET | ORAL | Status: DC | PRN

## 2023-04-18 MED ORDER — POLYVINYL ALCOHOL 1.4 % OP SOLN: 1.0000 [drp] | Freq: Four times a day (QID) | OPHTHALMIC | Status: DC | PRN

## 2023-04-18 MED ORDER — ONDANSETRON 4 MG PO TBDP: 4.0000 mg | ORAL_TABLET | Freq: Four times a day (QID) | ORAL | Status: DC | PRN

## 2023-04-18 MED ORDER — SODIUM CHLORIDE 0.9 % IV SOLN: 250.0000 mL | INTRAVENOUS | Status: DC

## 2023-04-18 MED ORDER — HALOPERIDOL LACTATE 2 MG/ML PO CONC: 0.5000 mg | ORAL | Status: DC | PRN

## 2023-04-18 MED ORDER — ACETAMINOPHEN 325 MG PO TABS: 650.0000 mg | ORAL_TABLET | Freq: Four times a day (QID) | ORAL | Status: DC | PRN

## 2023-04-18 MED ORDER — BIOTENE DRY MOUTH MT LIQD: 15.0000 mL | OROMUCOSAL | Status: DC | PRN

## 2023-04-18 MED ORDER — HALOPERIDOL LACTATE 5 MG/ML IJ SOLN: 0.5000 mg | INTRAMUSCULAR | Status: DC | PRN

## 2023-04-18 MED ORDER — MORPHINE SULFATE (PF) 2 MG/ML IV SOLN
1.0000 mg | INTRAVENOUS | Status: DC | PRN
  Administered 2023-04-18: 1 mg via INTRAVENOUS
  Filled 2023-04-18: qty 1

## 2023-04-18 MED ORDER — PIPERACILLIN-TAZOBACTAM 3.375 G IVPB: 3.3750 g | Freq: Three times a day (TID) | INTRAVENOUS | Status: DC

## 2023-04-18 MED ORDER — GLYCOPYRROLATE 1 MG PO TABS
1.0000 mg | ORAL_TABLET | ORAL | Status: DC | PRN
  Filled 2023-04-18: qty 1

## 2023-04-18 MED ORDER — HALOPERIDOL 0.5 MG PO TABS: 0.5000 mg | ORAL_TABLET | ORAL | Status: DC | PRN

## 2023-04-18 MED ORDER — NOREPINEPHRINE 4 MG/250ML-% IV SOLN
INTRAVENOUS | Status: AC
  Filled 2023-04-18: qty 250

## 2023-04-18 MED ORDER — SODIUM CHLORIDE 0.9 % IV SOLN: 12.5000 mg | Freq: Four times a day (QID) | INTRAVENOUS | Status: DC | PRN

## 2023-04-18 NOTE — Progress Notes (Signed)
PROGRESS NOTE  Aaron Leon ZOX:[id number] DOB: 02/10/1951   PCP: [name]  Patient is from: Home.  Lives with his wife.  Uses wheelchair for mobility.  Dependent for most ADLs.  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 intake, and admitted with working diagnosis of community-acquired pneumonia, hypothermia and lactic acidosis.  Reportedly had decreased p.o. intake for about a week or 2.  He was also hypotensive to 70s/50s and hypoxic to 80s that prompted patient's wife to activate EMS.    In ED, hypothermic to 95.  SBP as low as 80.  DBP as low as 44.  Saturating at 94% on 5 L.  WBC 16.6 with left shift.  Lactic acid 2.1>>> 2.8.  CXR with patchy bilateral airspace disease, right > left concerning for pneumonia.  Cultures obtained.  Patient was started on IV fluid, CTX, Zithromax and potassium supplementation.  Patient remained hypotensive despite aggressive IV fluid resuscitation.  Also increased oxygen requirement likely from aspiration.  Given concern for failure to thrive and grim prognosis, patient was transition to full comfort care after further goal of care discussion with patient's wife on 04/17/2022.  TOC consulted for residential hospice referral.  Subjective: Seen and examined earlier this morning.  He was started on Levophed overnight.  Increased oxygen requirement.  Also gurgling concerning for aspiration.  He is confused.  Objective: Vitals:   04/18/23 1015 04/18/23 1030 04/18/23 1045 04/18/23 1100  BP: 122/74 105/62 117/69 107/69  Pulse:  87 (!) 109 (!) 115  Resp: (!) 22 (!) 21 20 (!) 26  Temp:      TempSrc:      SpO2:   97%  92%  Weight:      Height:        Examination:  GENERAL: No apparent distress.  Nontoxic. HEENT: MMM.  Vision and hearing grossly intact.  NECK: Supple.  No apparent JVD.  RESP:  No IWOB.  Fair aeration bilaterally.  Rhonchi and gurgling. CVS:  RRR. Heart sounds normal.  ABD/GI/GU: BS+. Abd soft, NTND.  MSK/EXT:  Moves extremities.  Significant muscle mass and subcu fat loss. SKIN: no apparent skin lesion or wound NEURO: Awake but not alert.  Confused.  Follows some commands.  No apparent focal neurodeficit. PSYCH: Calm. Normal affect.   Procedures:  None  Microbiology summarized: Blood cultures NGTD RVP pending.  Assessment and plan: End-of-life care/full comfort care: After discussion with patient's wife at bedside about his current condition, poor prognosis and poor quality of life, patient's wife felt full comfort care is to his best interest. -Initiated full comfort care-orders in place. -Will continue IV Zosyn while in house -[facility] consulted for residential hosp[facility] placement.  Acute on chronic respiratory failure with hypoxia due to community-acquired pneumonia: Presents with progressive shortness of breath, cough, hypoxemia and hypotension.  Patient is on 3 L at baseline.  CXR with bibasilar infiltrate, right > left.  Has leukocytosis with left shift.  Pro-Cal elevated to 3.66.  Blood cultures NGTD -Escalated ceftriaxone to Zosyn earlier  Chronic COPD: Doubt exacerbation. -Full comfort care.   Parkinson's disease/dementia without behavioral disturbance: Stable.  Awake and alert but only  oriented to self, wife and "hospital". -Full comfort care.  Hypotension/history of essential hypertension: Reportedly hypotensive to 70s/50s at home.  SBP in 80s with DBP in 40s in ED. on low-dose lisinopril, metoprolol, Lasix and Flomax at home.  He is also on baclofen and oral oxycodone which could contribute.  Started on Levophed overnight. -Now full comfort care.  IDDM-2 with  hyperglycemia: A1c 7.0% in 12/2022.  Seems to be on insulin and metformin at home. -Full comfort care.  Hypokalemia/hypomagnesemia -Monitor replenish as appropriate  Lactic acidosis possibly due to above: Resolved.   History of pulmonary embolism: Per wife, no longer and Eliquis.  History of CVA: seems full dose aspirin and statin.  Chronic pain/insomnia: On Percocet, baclofen, gabapentin and Ambien.  Definitely at risk for polypharmacy. -IV morphine as needed for pain   Ambulatory dysfunction/wheelchair dependence  Advance care planning: DNR/DNI.  Full comfort care.  Decreased oral intake Body mass index is 18.3 kg/m.    DVT prophylaxis:  Patient is full comfort care.  Code Status: DNR/DNI Family Communication: Updated patient's wife at bedside Level of care: ICU Status is: Inpatient Remains inpatient appropriate because: Residential hospice placement   Final disposition: Residential hospice Consultants:  Palliative medicine  55 minutes with more than 50% spent in reviewing records, counseling patient/family and coordinating care.   Sch Meds:  Scheduled Meds:  gabapentin  300 mg Oral BID WC   Continuous Infusions:  chlorproMAZINE (THORAZINE) 12.5 mg in sodium chloride 0.9 % 25 mL IVPB     norepinephrine     piperacillin-tazobactam (ZOSYN)  IV     PRN Meds:.acetaminophen **OR** acetaminophen, antiseptic oral rinse, chlorproMAZINE (THORAZINE) 12.5 mg in sodium chloride 0.9 % 25 mL IVPB, glycopyrrolate **OR** glycopyrrolate **OR** glycopyrrolate, haloperidol **OR** haloperidol **OR** haloperidol lactate, LORazepam **OR** LORazepam **OR** LORazepam, morphine injection, norepinephrine, ondansetron **OR** ondansetron (ZOFRAN) IV, polyvinyl alcohol  Antimicrobials: Anti-infectives (From admission, onward)    Start     Dose/Rate Route Frequency Ordered Stop   04/18/23 1400  piperacillin-tazobactam (ZOSYN) IVPB 3.375 g        3.375 g 12.5 mL/hr over 240 Minutes  Intravenous Every 8 hours 04/18/23 0930     04/17/23 2200  cefTRIAXone (ROCEPHIN) 2 g in sodium chloride 0.9 % 100 mL IVPB  Status:  Discontinued        2 g 200 mL/hr over 30 Minutes Intravenous Every 24 hours 04/17/23 0534 04/18/23 0839   04/17/23 2200  azithromycin (ZITHROMAX) 500 mg in sodium chloride 0.9 % 250 mL IVPB  Status:  Discontinued        500 mg 250 mL/hr over 60 Minutes Intravenous Every 24 hours 04/17/23 0534 04/18/23 1128   04/16/23 2115  cefTRIAXone (ROCEPHIN) 2 g in sodium chloride 0.9 % 100 mL IVPB        2 g 200 mL/hr over 30 Minutes Intravenous Once 04/16/23 2109 04/16/23 2218   04/16/23 2115  azithromycin (ZITHROMAX) 500 mg in sodium chloride 0.9 % 250 mL IVPB        500 mg 250 mL/hr over 60 Minutes Intravenous  Once 04/16/23 2109 04/16/23 2320        I have personally reviewed the following labs and images: CBC: Recent Labs  Lab 04/16/23 2132 04/17/23 0858 04/18/23 0512  WBC 16.6* 19.1* 23.1*  NEUTROABS 15.4*  --   --   HGB 12.2* 11.7* 12.0*  HCT 39.9 37.9* 39.9  MCV 90.7 90.9 91.3  PLT 300 340 394   BMP &GFR Recent Labs  Lab 04/16/23 2132 04/17/23 0858 04/18/23 0512  NA 142 143 145  K 3.1* 2.7* 4.1  CL 105 110 111  CO2 25 19* 19*  GLUCOSE 139* 139* 167*  BUN 33* 32* 33*  CREATININE 1.13 0.98 0.89  CALCIUM 7.5* 7.5* 8.1*  MG  --  1.7 2.0  PHOS  --   --  3.1   Estimated Creatinine Clearance: 63 mL/min (by C-G formula based on SCr of 0.89 mg/dL). Liver & Pancreas: Recent Labs  Lab 04/16/23 2132 04/17/23 0858 04/18/23 0512  AST 15 13* 16  ALT 9 10 <5  ALKPHOS 143* 134* 134*  BILITOT 0.8 1.2 0.9  PROT 4.9* 4.6* 4.9*  ALBUMIN 1.6* <1.5* 1.5*   No results for input(s): "LIPASE", "AMYLASE" in the last 168 hours. No results for input(s): "AMMONIA" in the last 168 hours. Diabetic: No results for input(s): "HGBA1C" in the last 72 hours. Recent Labs  Lab 04/17/23 1157 04/17/23 1347 04/17/23 1551 04/17/23 2304 04/18/23 0752  GLUCAP  117* 122* 125* 127* 152*   Cardiac Enzymes: No results for input(s): "CKTOTAL", "CKMB", "CKMBINDEX", "TROPONINI" in the last 168 hours. No results for input(s): "PROBNP" in the last 8760 hours. Coagulation Profile: Recent Labs  Lab 04/16/23 2132  INR 1.2   Thyroid Function Tests: Recent Labs    04/18/23 0512  TSH 1.272   Lipid Profile: No results for input(s): "CHOL", "HDL", "LDLCALC", "TRIG", "CHOLHDL", "LDLDIRECT" in the last 72 hours. Anemia Panel: No results for input(s): "VITAMINB12", "FOLATE", "FERRITIN", "TIBC", "IRON", "RETICCTPCT" in the last 72 hours. Urine analysis:    Component Value Date/Time   COLORURINE YELLOW 04/17/2023 1009   APPEARANCEUR HAZY (A) 04/17/2023 1009   LABSPEC 1.024 04/17/2023 1009   PHURINE 5.0 04/17/2023 1009   GLUCOSEU 50 (A) 04/17/2023 1009   HGBUR SMALL (A) 04/17/2023 1009   BILIRUBINUR NEGATIVE 04/17/2023 1009   KETONESUR 5 (A) 04/17/2023 1009   PROTEINUR 30 (A) 04/17/2023 1009   NITRITE NEGATIVE 04/17/2023 1009   LEUKOCYTESUR MODERATE (A) 04/17/2023 1009   Sepsis Labs: Invalid input(s): "PROCALCITONIN", "LACTICIDVEN"  Microbiology: Recent Results (from the past 240 hours)  Blood Culture (routine x 2)     Status: None (Preliminary result)   Collection Time: 04/16/23  9:32 PM   Specimen: BLOOD RIGHT FOREARM  Result Value Ref Range Status   Specimen Description BLOOD RIGHT FOREARM BOTTLES DRAWN AEROBIC ONLY  Final   Special Requests   Final    Blood Culture results may not be optimal due to an inadequate volume of blood received in culture bottles   Culture   Final    NO GROWTH 2 DAYS Performed at [facility], [address] [place], [place] [address]    Report Status PENDING  Incomplete  Blood Culture (routine x 2)     Status: None (Preliminary result)   Collection Time: 04/16/23  9:32 PM   Specimen: Right Antecubital; Blood  Result Value Ref Range Status   Specimen Description   Final    RIGHT ANTECUBITAL BOTTLES DRAWN  AEROBIC AND ANAEROBIC   Special Requests Blood Culture adequate volume  Final   Culture   Final    NO GROWTH 2 DAYS Performed at [facility], [address], [place], [place] [address]    Report Status PENDING  Incomplete  Respiratory (~20 pathogens) panel by PCR     Status: None   Collection Time: 04/17/23 10:04 AM   Specimen: Nasopharyngeal Swab; Respiratory  Result Value Ref Range Status   Adenovirus  NOT DETECTED NOT DETECTED Final   Coronavirus 229E NOT DETECTED NOT DETECTED Final    Comment: (NOTE) The Coronavirus on the Respiratory Panel, DOES NOT test for the novel  Coronavirus (2019 nCoV)    Coronavirus HKU1 NOT DETECTED NOT DETECTED Final   Coronavirus NL63 NOT DETECTED NOT DETECTED Final   Coronavirus OC43 NOT DETECTED NOT DETECTED Final   Metapneumovirus NOT DETECTED NOT DETECTED Final   Rhinovirus / Enterovirus NOT DETECTED NOT DETECTED Final   Influenza A NOT DETECTED NOT DETECTED Final   Influenza B NOT DETECTED NOT DETECTED Final   Parainfluenza Virus 1 NOT DETECTED NOT DETECTED Final   Parainfluenza Virus 2 NOT DETECTED NOT DETECTED Final   Parainfluenza Virus 3 NOT DETECTED NOT DETECTED Final   Parainfluenza Virus 4 NOT DETECTED NOT DETECTED Final   Respiratory Syncytial Virus NOT DETECTED NOT DETECTED Final   Bordetella pertussis NOT DETECTED NOT DETECTED Final   Bordetella Parapertussis NOT DETECTED NOT DETECTED Final   Chlamydophila pneumoniae NOT DETECTED NOT DETECTED Final   Mycoplasma pneumoniae NOT DETECTED NOT DETECTED Final    Comment: Performed at [facility], [address], [place], [place] [address]  Resp panel by RT-PCR (RSV, Flu A&B, Covid) Anterior Nasal Swab     Status: None   Collection Time: 04/17/23  2:55 PM   Specimen: Anterior Nasal Swab  Result Value Ref Range Status   SARS Coronavirus 2 by RT PCR NEGATIVE NEGATIVE Final    Comment: (NOTE) SARS-CoV-2 target nucleic acids are NOT DETECTED.  The SARS-CoV-2 RNA is generally  detectable in upper respiratory specimens during the acute phase of infection. The lowest concentration of SARS-CoV-2 viral copies this assay can detect is 138 copies/mL. A negative result does not preclude SARS-Cov-2 infection and should not be used as the sole basis for treatment or other patient management decisions. A negative result may occur with  improper specimen collection/handling, submission of specimen other than nasopharyngeal swab, presence of viral mutation(s) within the areas targeted by this assay, and inadequate number of viral copies(<138 copies/mL). A negative result must be combined with clinical observations, patient history, and epidemiological information. The expected result is Negative.  Fact Sheet for Patients:  BloggerCourse.com  Fact Sheet for Healthcare Providers:  SeriousBroker.it  This test is no t yet approved or cleared by the [place] FDA and  has been authorized for detection and/or diagnosis of SARS-CoV-2 by FDA under an Emergency Use Authorization (EUA). This EUA will remain  in effect (meaning this test can be used) for the duration of the COVID-19 declaration under Section 564(b)(1) of the Act, 21 U.S.C.section 360bbb-3(b)(1), unless the authorization is terminated  or revoked sooner.       Influenza A by PCR NEGATIVE NEGATIVE Final   Influenza B by PCR NEGATIVE NEGATIVE Final    Comment: (NOTE) The Xpert Xpress SARS-CoV-2/FLU/RSV plus assay is intended as an aid in the diagnosis of influenza from Nasopharyngeal swab specimens and should not be used as a sole basis for treatment. Nasal washings and aspirates are unacceptable for Xpert Xpress SARS-CoV-2/FLU/RSV testing.  Fact Sheet for Patients: BloggerCourse.com  Fact Sheet for Healthcare Providers: SeriousBroker.it  This test is not yet approved or cleared by the [place] FDA  and has been authorized for detection and/or diagnosis of SARS-CoV-2 by FDA under an Emergency Use Authorization (EUA). This EUA will remain in effect (meaning this test can be used) for the duration of the COVID-19 declaration under Section 564(b)(1) of the Act,  21 U.S.C. section 360bbb-3(b)(1), unless the authorization is terminated or revoked.     Resp Syncytial Virus by PCR NEGATIVE NEGATIVE Final    Comment: (NOTE) Fact Sheet for Patients: BloggerCourse.com  Fact Sheet for Healthcare Providers: SeriousBroker.it  This test is not yet approved or cleared by the [place] FDA and has been authorized for detection and/or diagnosis of SARS-CoV-2 by FDA under an Emergency Use Authorization (EUA). This EUA will remain in effect (meaning this test can be used) for the duration of the COVID-19 declaration under Section 564(b)(1) of the Act, 21 U.S.C. section 360bbb-3(b)(1), unless the authorization is terminated or revoked.  Performed at [facility], [address], [place], [place] [address]     Radiology Studies: [facility] Chest Town Line 1 View Result Date: 04/18/2023 CLINICAL DATA:  73 year old male with shortness of breath. COPD, former smoker. EXAM: PORTABLE CHEST 1 VIEW COMPARISON:  Portable chest 04/16/2023 and earlier. FINDINGS: Portable AP semi upright view at 0902 hours. Ongoing patchy and confluent multifocal right lung opacity in the upper lobe, bordering the minor fissure and at the lung base. Larger lung volumes. Stable cardiac size and mediastinal contours. Calcified aortic atherosclerosis. Left lung appears negative. No pneumothorax. No definite pleural effusion. No pulmonary edema. Anterior and posterior cervical spine fusion hardware. Osteopenia. IMPRESSION: Ongoing multifocal right lung opacity suspicious for Multilobar Pneumonia. No new cardiopulmonary abnormality. Electronically Signed   By: Odessa Fleming M.D.   On: 04/18/2023 09:49       Daquarius Dubeau T. Sergey Ishler [facility]  If 7PM-7AM, please contact night-coverage [email or web address] 04/18/2023, 11:30 AM

## 2023-04-18 NOTE — Care Management Important Message (Signed)
Important Message  Patient Details  Name: Aaron Leon MRN: [id number] Date of Birth: Aug 11, 1950   Important Message Given:  N/A - LOS <3 / Initial given by admissions     [name] 04/18/2023, 4:02 PM

## 2023-04-18 NOTE — Plan of Care (Signed)
  Problem: Education: Goal: Ability to describe self-care measures that may prevent or decrease complications (Diabetes Survival Skills Education) will improve Outcome: Progressing Goal: Individualized Educational Video(s) Outcome: Progressing   Problem: Coping: Goal: Ability to adjust to condition or change in health will improve Outcome: Progressing   Problem: Fluid Volume: Goal: Ability to maintain a balanced intake and output will improve Outcome: Progressing   Problem: Health Behavior/Discharge Planning: Goal: Ability to identify and utilize available resources and services will improve Outcome: Progressing Goal: Ability to manage health-related needs will improve Outcome: Progressing   Problem: Metabolic: Goal: Ability to maintain appropriate glucose levels will improve Outcome: Progressing   Problem: Nutritional: Goal: Maintenance of adequate nutrition will improve Outcome: Progressing Goal: Progress toward achieving an optimal weight will improve Outcome: Progressing   Problem: Skin Integrity: Goal: Risk for impaired skin integrity will decrease Outcome: Progressing   Problem: Tissue Perfusion: Goal: Adequacy of tissue perfusion will improve Outcome: Progressing   Problem: Education: Goal: Knowledge of General Education information will improve Description: Including pain rating scale, medication(s)/side effects and non-pharmacologic comfort measures Outcome: Progressing   Problem: Health Behavior/Discharge Planning: Goal: Ability to manage health-related needs will improve Outcome: Progressing   Problem: Clinical Measurements: Goal: Ability to maintain clinical measurements within normal limits will improve Outcome: Progressing Goal: Will remain free from infection Outcome: Progressing Goal: Diagnostic test results will improve Outcome: Progressing Goal: Respiratory complications will improve Outcome: Progressing Goal: Cardiovascular complication will  be avoided Outcome: Progressing   Problem: Activity: Goal: Risk for activity intolerance will decrease Outcome: Progressing   Problem: Nutrition: Goal: Adequate nutrition will be maintained Outcome: Progressing   Problem: Coping: Goal: Level of anxiety will decrease Outcome: Progressing   Problem: Elimination: Goal: Will not experience complications related to bowel motility Outcome: Progressing Goal: Will not experience complications related to urinary retention Outcome: Progressing   Problem: Pain Managment: Goal: General experience of comfort will improve and/or be controlled Outcome: Progressing   Problem: Safety: Goal: Ability to remain free from injury will improve Outcome: Progressing   Problem: Skin Integrity: Goal: Risk for impaired skin integrity will decrease Outcome: Progressing   Problem: Activity: Goal: Ability to tolerate increased activity will improve Outcome: Progressing   Problem: Clinical Measurements: Goal: Ability to maintain a body temperature in the normal range will improve Outcome: Progressing   Problem: Respiratory: Goal: Ability to maintain adequate ventilation will improve Outcome: Progressing Goal: Ability to maintain a clear airway will improve Outcome: Progressing   Problem: Education: Goal: Knowledge of the prescribed therapeutic regimen will improve Outcome: Progressing   Problem: Coping: Goal: Ability to identify and develop effective coping behavior will improve Outcome: Progressing   Problem: Clinical Measurements: Goal: Quality of life will improve Outcome: Progressing   Problem: Respiratory: Goal: Verbalizations of increased ease of respirations will increase Outcome: Progressing   Problem: Role Relationship: Goal: Family's ability to cope with current situation will improve Outcome: Progressing Goal: Ability to verbalize concerns, feelings, and thoughts to partner or family member will improve Outcome:  Progressing   Problem: Pain Management: Goal: Satisfaction with pain management regimen will improve Outcome: Progressing

## 2023-04-18 NOTE — Consult Note (Addendum)
Pharmacy Antibiotic Note  ASSESSMENT: 73 y.o. male with PMH COPD (chronic 3L O2) is presenting with sepsis in setting of respiratory failure secondary to CAP. Patient started on ceftriaxone and azithromycin but will now switch to Zosyn due to h/o Pseudomonas in urine. Pharmacy has been consulted to manage Zosyn dosing.  Patient measurements: Height: 6' (182.9 cm) Weight: 61.2 kg (134 lb 14.7 oz) IBW/kg (Calculated) : 77.6  Vital signs: Temp: 98.7 F (37.1 C) (01/24 0800) Temp Source: Axillary (01/24 0800) BP: 117/44 (01/24 0845) Pulse Rate: 107 (01/24 0845) Recent Labs  Lab 04/16/23 2132 04/17/23 0858 04/18/23 0512  WBC 16.6* 19.1* 23.1*  CREATININE 1.13 0.98 0.89   Estimated Creatinine Clearance: 63 mL/min (by C-G formula based on SCr of 0.89 mg/dL).  Allergies: Allergies  Allergen Reactions   Barbiturates Other (See Comments)    Blacks out    Seroquel  [Quetiapine] Other (See Comments)    Hallucination   Shellfish Allergy Hives    Antimicrobials this admission: Ceftriaxone 1/23 >>  Azithromycin 1/23 >> Zosyn 1/24 >>  Dose adjustments this admission: N/A  Microbiology results: 1/22 BCx: NGTD 1/23 UCx: sent 1/23 COVID/Flu/RSV: negative  1/23 Respiratory panel PCR: negative  PLAN: Discontinue ceftriaxone Initiate Zosyn 3.375 g IV q8H Continue azithromycin 500 mg IV q24H Follow up culture results to assess for antibiotic optimization. Monitor renal function to assess for any necessary antibiotic dosing changes.   Thank you for allowing pharmacy to be a part of this patient's care.  Will M. Dareen Piano, PharmD Clinical Pharmacist 04/18/2023 9:32 AM

## 2023-04-18 NOTE — Progress Notes (Signed)
Nutrition Brief Note  Chart reviewed. Pt now transitioning to comfort care.  No further nutrition interventions planned at this time.  Please re-consult as needed.   Drusilla Kanner, RDN, LDN Clinical Nutrition

## 2023-04-18 NOTE — TOC Progression Note (Signed)
Transition of Care [facility]) - Progression Note    Patient Details  Name: Aaron Leon MRN: [id number] Date of Birth: [birth year]  Transition of Care [facility]) CM/SW Contact  Isabella Bowens, [place] Phone Number: 04/18/2023, 3:24 PM  Clinical Narrative:     CSW spoke with spouse who is at bedside with pt regarding a consult for residential hospice. Spouse residential hospice preference was [place]. CSW reached out to [facility] and asked if they had any beds and Rae Halsted stated that she thinks they do and said that a nurse would be out tomorrow to assess patient. CSW asked Rae Halsted if a nurse could call spouse and provide her with a time they will visit so spouse can be present. Spouse made aware that nurse will give her a call. TOC will continue to follow.    Expected Discharge Plan: Home w Home Health Services Barriers to Discharge: Continued Medical Work up  Expected Discharge Plan and Services In-house Referral: Clinical Social Work   Post Acute Care Choice: Horticulturist, commercial, Home Health Living arrangements for the past 2 months: Single Family Home                   [facility]:  ([facility] [place] [place] [facility]   Social Determinants of Health (SDOH) Interventions SDOH Screenings   Food Insecurity: No Food Insecurity (04/17/2023)  Housing: Low Risk  (04/17/2023)  Transportation Needs: No Transportation Needs (04/17/2023)  Utilities: Not At Risk (04/17/2023)  Financial Resource Strain: Medium Risk (06/05/2022)   Received from [facility], [facility]  Social Connections: Unknown (04/17/2023)  Stress: No Stress Concern Present (06/01/2022)   Received from [facility], [facility]  Tobacco Use: Medium Risk (04/16/2023)    Readmission Risk Interventions    04/18/2023    3:24 PM 04/17/2023    9:16 AM 01/08/2023    2:01 PM  Readmission Risk Prevention Plan  Transportation Screening Complete Complete Complete  HRI or Home Care Consult  Complete   Social Work Consult for Recovery Care  Planning/Counseling  Complete   Palliative Care Screening  Not Applicable   Medication Review Oceanographer) Complete Complete Complete  HRI or Home Care Consult Complete  Complete  SW Recovery Care/Counseling Consult Complete  Complete  Palliative Care Screening Complete  Not Applicable  Skilled Nursing Facility Not Complete  Not Applicable  SNF Comments Residential hospice recommended

## 2023-04-18 NOTE — Plan of Care (Signed)

## 2023-04-19 DIAGNOSIS — I2699 Other pulmonary embolism without acute cor pulmonale: Secondary | ICD-10-CM

## 2023-04-19 DIAGNOSIS — F03918 Unspecified dementia, unspecified severity, with other behavioral disturbance: Secondary | ICD-10-CM | POA: Diagnosis not present

## 2023-04-19 DIAGNOSIS — R627 Adult failure to thrive: Secondary | ICD-10-CM | POA: Insufficient documentation

## 2023-04-19 DIAGNOSIS — J690 Pneumonitis due to inhalation of food and vomit: Secondary | ICD-10-CM | POA: Diagnosis not present

## 2023-04-19 DIAGNOSIS — E8809 Other disorders of plasma-protein metabolism, not elsewhere classified: Secondary | ICD-10-CM | POA: Diagnosis not present

## 2023-04-19 LAB — URINE CULTURE: Culture: <10000 — AB

## 2023-04-21 LAB — CULTURE, BLOOD (ROUTINE X 2)
Culture: NO GROWTH
Culture: NO GROWTH
Special Requests: ADEQUATE

## 2023-04-26 NOTE — ED Provider Notes (Signed)
[facility] MEDICAL SURGICAL UNIT Provider Note   CSN: [id number] Arrival date & time: 04/16/23  2057     History  Chief Complaint  Patient presents with   Hypotension   Failure To Thrive    Aaron Leon is a 73 y.o. male.  Patient with history of hypertension.  Patient presents with weakness and hypotension.  The history is provided by the patient and medical records. No language interpreter was used.  Weakness Severity:  Moderate Onset quality:  Sudden Timing:  Constant Progression:  Worsening Chronicity:  New Context: not alcohol use   Relieved by:  Nothing Worsened by:  Nothing Ineffective treatments:  None tried Associated symptoms: no abdominal pain, no chest pain, no cough, no diarrhea, no frequency, no headaches and no seizures        Home Medications Prior to Admission medications   Medication Sig Start Date End Date Taking? Authorizing Provider  acetaminophen (TYLENOL) 500 MG tablet Take 500 mg by mouth every 6 (six) hours as needed for moderate pain (pain score 4-6).   Yes [provider]  albuterol (PROVENTIL) (2.5 MG/3ML) 0.083% nebulizer solution Take 2.5 mg by nebulization every 6 (six) hours as needed for wheezing or shortness of breath.   Yes [provider]  albuterol (VENTOLIN HFA) 108 (90 Base) MCG/ACT inhaler Inhale 2 puffs into the lungs every 6 (six) hours as needed for wheezing or shortness of breath.   Yes [provider]  ascorbic acid (VITAMIN C) 500 MG tablet Take 500 mg by mouth daily.   Yes [provider]  aspirin 325 MG tablet Take 325 mg by mouth daily.   Yes [provider]  atorvastatin (LIPITOR) 40 MG tablet Take 1 tablet (40 mg total) by mouth at bedtime. 06/19/22 04/17/23 Yes Shahmehdi, Gemma Payor, MD  baclofen (LIORESAL) 10 MG tablet Take 10-20 mg by mouth daily.   Yes [provider]  budesonide-formoterol (SYMBICORT) 80-4.5 MCG/ACT inhaler Inhale 2 puffs into the lungs 2 (two)  times daily.   Yes [provider]  carbidopa-levodopa (SINEMET IR) 25-100 MG tablet Take 2 tablets by mouth 3 (three) times daily. 02/20/21  Yes [provider]  cetirizine (ZYRTEC) 10 MG tablet Take 10 mg by mouth in the morning.    Yes [provider]  cholecalciferol (VITAMIN D3) 25 MCG (1000 UNIT) tablet Take 1,000 Units by mouth in the morning and at bedtime.   Yes [provider]  ferrous sulfate 325 (65 FE) MG EC tablet Take 325 mg by mouth in the morning.    Yes [provider]  furosemide (LASIX) 20 MG tablet Take 20 mg by mouth at bedtime.   Yes [provider]  gabapentin (NEURONTIN) 300 MG capsule Take 2 capsules (600 mg total) by mouth 2 (two) times daily with breakfast and lunch. Patient taking differently: Take 600-900 mg by mouth 3 (three) times daily. 600mg  AM, 600MG  lunch, 900mg  HS 06/19/22 04/17/23 Yes Shahmehdi, Seyed A, MD  galantamine (RAZADYNE ER) 24 MG 24 hr capsule Take 24 mg by mouth at bedtime. 02/20/21  Yes [provider]  insulin aspart (NOVOLOG) 100 UNIT/ML injection Inject 1-3 Units into the skin 3 (three) times daily before meals. Per sliding scale   Yes [provider]  lisinopril (ZESTRIL) 2.5 MG tablet Take 2.5 mg by mouth daily.   Yes [provider]  magnesium oxide (MAG-OX) 400 (240 Mg) MG tablet Take 400-800 mg by mouth daily. 2 tablets in the morning and  1 tablet in the evening   Yes [provider]  melatonin 3 MG TABS tablet Take 3 mg by mouth at bedtime.   Yes [provider]  memantine (NAMENDA) 10 MG tablet Take 20 mg by mouth at bedtime.   Yes [provider]  metFORMIN (GLUCOPHAGE) 1000 MG tablet Take 500 mg by mouth 2 (two) times daily with a meal.   Yes [provider]  omeprazole (PRILOSEC) 20 MG capsule Take 80 mg by mouth in the morning.   Yes [provider]  oxybutynin (DITROPAN-XL) 10 MG 24 hr tablet Take 10 mg by mouth at  bedtime.   Yes [provider]  oxyCODONE-acetaminophen (PERCOCET) 10-325 MG tablet Take 1 tablet by mouth every 4 (four) hours as needed for pain.   Yes [provider]  sertraline (ZOLOFT) 100 MG tablet Take 100 mg by mouth in the morning.    Yes [provider]  tamsulosin (FLOMAX) 0.4 MG CAPS capsule Take 0.4 mg by mouth at bedtime.   Yes [provider]  VITAMIN A PO Take by mouth.   Yes [provider]  zolpidem (AMBIEN) 10 MG tablet Take 10 mg by mouth at bedtime.   Yes [provider]  carbidopa-levodopa (SINEMET CR) 50-200 MG tablet Take 1 tablet by mouth at bedtime. 06/19/22 01/07/23  Shahmehdi, Gemma Payor, MD  metoprolol tartrate (LOPRESSOR) 25 MG tablet Take 12.5 mg by mouth 2 (two) times daily.    [provider]      Allergies    Barbiturates, Seroquel  [quetiapine], and Shellfish allergy    Review of Systems   Review of Systems  Constitutional:  Negative for appetite change and fatigue.  HENT:  Negative for congestion, ear discharge and sinus pressure.   Eyes:  Negative for discharge.  Respiratory:  Negative for cough.   Cardiovascular:  Negative for chest pain.  Gastrointestinal:  Negative for abdominal pain and diarrhea.  Genitourinary:  Negative for frequency and hematuria.  Musculoskeletal:  Negative for back pain.  Skin:  Negative for rash.  Neurological:  Positive for weakness. Negative for seizures and headaches.  Psychiatric/Behavioral:  Negative for hallucinations.     Physical Exam Updated Vital Signs BP (!) 85/45 (BP Location: Right Arm)   Pulse 100   Temp 98.7 F (37.1 C) (Axillary)   Resp (!) 24   Ht 6' (1.829 m)   Wt 61.2 kg   SpO2 98%   BMI 18.30 kg/m  Physical Exam Vitals and nursing note reviewed.  Constitutional:      Appearance: He is well-developed.  HENT:     Head: Normocephalic.     Right Ear: External ear normal.     Nose: Nose normal.  Eyes:     General: No scleral  icterus.    Conjunctiva/sclera: Conjunctivae normal.  Neck:     Thyroid: No thyromegaly.  Cardiovascular:     Rate and Rhythm: Normal rate and regular rhythm.     Heart sounds: No murmur heard.    No friction rub. No gallop.  Pulmonary:     Breath sounds: No stridor. No wheezing or rales.  Chest:     Chest wall: No tenderness.  Abdominal:     General: There is no distension.     Tenderness: There is no abdominal tenderness. There is no rebound.  Musculoskeletal:        General: Normal range of motion.     Cervical back: Neck supple.  Lymphadenopathy:  Cervical: No cervical adenopathy.  Skin:    Findings: No erythema or rash.  Neurological:     Mental Status: He is alert and oriented to person, place, and time.     Motor: No abnormal muscle tone.     Coordination: Coordination normal.  Psychiatric:        Behavior: Behavior normal.     ED Results / Procedures / Treatments   Labs (all labs ordered are listed, but only abnormal results are displayed) Labs Reviewed  URINE CULTURE - Abnormal; Notable for the following components:      Result Value   Culture   (*)    Value: <10,000 COLONIES/mL INSIGNIFICANT GROWTH Performed at [facility], [address] [place], [place] [address]    All other components within normal limits  LACTIC ACID, PLASMA - Abnormal; Notable for the following components:   Lactic Acid, Venous 2.1 (*)    All other components within normal limits  LACTIC ACID, PLASMA - Abnormal; Notable for the following components:   Lactic Acid, Venous 2.5 (*)    All other components within normal limits  COMPREHENSIVE METABOLIC PANEL - Abnormal; Notable for the following components:   Potassium 3.1 (*)    Glucose, Bld 139 (*)    BUN 33 (*)    Calcium 7.5 (*)    Total Protein 4.9 (*)    Albumin 1.6 (*)    Alkaline Phosphatase 143 (*)    All other components within normal limits  CBC WITH DIFFERENTIAL/PLATELET - Abnormal; Notable for the following  components:   WBC 16.6 (*)    Hemoglobin 12.2 (*)    RDW 19.0 (*)    Neutro Abs 15.4 (*)    Monocytes Absolute 0.0 (*)    Abs Immature Granulocytes 0.20 (*)    All other components within normal limits  PROTIME-INR - Abnormal; Notable for the following components:   Prothrombin Time 15.4 (*)    All other components within normal limits  URINALYSIS, W/ REFLEX TO CULTURE (INFECTION SUSPECTED) - Abnormal; Notable for the following components:   APPearance HAZY (*)    Glucose, UA 50 (*)    Hgb urine dipstick SMALL (*)    Ketones, ur 5 (*)    Protein, ur 30 (*)    Leukocytes,Ua MODERATE (*)    Bacteria, UA RARE (*)    All other components within normal limits  BRAIN NATRIURETIC PEPTIDE - Abnormal; Notable for the following components:   B Natriuretic Peptide 170.0 (*)    All other components within normal limits  LACTIC ACID, PLASMA - Abnormal; Notable for the following components:   Lactic Acid, Venous 2.8 (*)    All other components within normal limits  COMPREHENSIVE METABOLIC PANEL - Abnormal; Notable for the following components:   Potassium 2.7 (*)    CO2 19 (*)    Glucose, Bld 139 (*)    BUN 32 (*)    Calcium 7.5 (*)    Total Protein 4.6 (*)    Albumin <1.5 (*)    AST 13 (*)    Alkaline Phosphatase 134 (*)    All other components within normal limits  CBC - Abnormal; Notable for the following components:   WBC 19.1 (*)    RBC 4.17 (*)    Hemoglobin 11.7 (*)    HCT 37.9 (*)    RDW 19.1 (*)    All other components within normal limits  GLUCOSE, CAPILLARY - Abnormal; Notable for the following components:  Glucose-Capillary 122 (*)    All other components within normal limits  GLUCOSE, CAPILLARY - Abnormal; Notable for the following components:   Glucose-Capillary 125 (*)    All other components within normal limits  COMPREHENSIVE METABOLIC PANEL - Abnormal; Notable for the following components:   CO2 19 (*)    Glucose, Bld 167 (*)    BUN 33 (*)    Calcium 8.1 (*)     Total Protein 4.9 (*)    Albumin 1.5 (*)    Alkaline Phosphatase 134 (*)    All other components within normal limits  CBC - Abnormal; Notable for the following components:   WBC 23.1 (*)    Hemoglobin 12.0 (*)    RDW 19.2 (*)    All other components within normal limits  GLUCOSE, CAPILLARY - Abnormal; Notable for the following components:   Glucose-Capillary 127 (*)    All other components within normal limits  GLUCOSE, CAPILLARY - Abnormal; Notable for the following components:   Glucose-Capillary 152 (*)    All other components within normal limits  GLUCOSE, CAPILLARY - Abnormal; Notable for the following components:   Glucose-Capillary 151 (*)    All other components within normal limits  CBG MONITORING, ED - Abnormal; Notable for the following components:   Glucose-Capillary 130 (*)    All other components within normal limits  CBG MONITORING, ED - Abnormal; Notable for the following components:   Glucose-Capillary 117 (*)    All other components within normal limits  RESP PANEL BY RT-PCR (RSV, FLU A&B, COVID)  RVPGX2  CULTURE, BLOOD (ROUTINE X 2)  CULTURE, BLOOD (ROUTINE X 2)  RESPIRATORY PANEL BY PCR  APTT  LACTIC ACID, PLASMA  LEGIONELLA PNEUMOPHILA SEROGP 1 UR AG  STREP PNEUMONIAE URINARY ANTIGEN  MAGNESIUM  PROCALCITONIN  MAGNESIUM  PHOSPHORUS  TSH    EKG EKG Interpretation Date/Time:  Wednesday April 16 2023 21:18:59 EST Ventricular Rate:  91 PR Interval:  158 QRS Duration:  91 QT Interval:  389 QTC Calculation: 479 R Axis:   -75  Text Interpretation: Sinus rhythm Low voltage, extremity leads Consider inferior infarct Consider anterior infarct When compared with ECG of 01/08/2023, Sinus rhythm has changed Atrial fibrillation with rapid ventricular response Confirmed by Dione Booze ([id number]) on 04/17/2023 5:12:30 AM  Radiology No results found.  Procedures Procedures    Medications Ordered in ED Medications  lactated ringers infusion (  Intravenous Stopped 04/17/23 0756)  norepinephrine (LEVOPHED) 4-5 MG/250ML-% infusion SOLN (has no administration in time range)  cefTRIAXone (ROCEPHIN) 2 g in sodium chloride 0.9 % 100 mL IVPB (0 g Intravenous Stopped 04/16/23 2218)  azithromycin (ZITHROMAX) 500 mg in sodium chloride 0.9 % 250 mL IVPB (0 mg Intravenous Stopped 04/16/23 2320)  sodium chloride 0.9 % bolus 2,000 mL (0 mLs Intravenous Stopped 04/16/23 2320)  lactated ringers bolus 500 mL (0 mLs Intravenous Stopped 04/17/23 0756)  potassium chloride SA (KLOR-CON M) CR tablet 40 mEq (40 mEq Oral Given 04/17/23 0918)  potassium chloride (KLOR-CON) packet 40 mEq (40 mEq Oral Given 04/17/23 1606)  magnesium sulfate IVPB 2 g 50 mL (0 g Intravenous Stopped 04/17/23 1154)  lactated ringers bolus 500 mL ( Intravenous Rate/Dose Change 04/17/23 1515)  lactated ringers bolus 1,000 mL ( Intravenous Stopped 04/17/23 1802)  hydrocortisone sodium succinate (SOLU-CORTEF) 100 MG injection 100 mg (100 mg Intravenous Given 04/17/23 1721)  lactated ringers bolus 500 mL (500 mLs Intravenous New Bag/Given 04/17/23 2305)    ED Course/ Medical Decision Making/ A&P  CRITICAL CARE Performed by: Bethann Berkshire Total critical care time: 45 minutes Critical care time was exclusive of separately billable procedures and treating other patients. Critical care was necessary to treat or prevent imminent or life-threatening deterioration. Critical care was time spent personally by me on the following activities: development of treatment plan with patient and/or surrogate as well as nursing, discussions with consultants, evaluation of patient's response to treatment, examination of patient, obtaining history from patient or surrogate, ordering and performing treatments and interventions, ordering and review of laboratory studies, ordering and review of radiographic studies, pulse oximetry and re-evaluation of patient's condition.                                Medical Decision  Making Amount and/or Complexity of Data Reviewed Labs: ordered. Radiology: ordered.  Risk Decision regarding hospitalization.   Patient with hypotension and pneumonia.  He is admitted to the hospitalist.  His hypotension resolved with normal saline        Final Clinical Impression(s) / ED Diagnoses Final diagnoses:  Community acquired pneumonia of right upper lobe of lung    Rx / DC Orders ED Discharge Orders     None         Bethann Berkshire, MD 04/20/23 1147

## 2023-04-26 NOTE — Progress Notes (Signed)
Pt passed at 6:53 according to night shift nurse. Charge nurse notified on night and day shift. Night Dr.Adefeso notified. Wife at bedside.

## 2023-04-26 NOTE — Plan of Care (Signed)
This pt continues to be on 'Comfort Care'. Remained stable and slept throughout night shift. Pt did not appear to be uncomfortable or in pain. Rise and fall of chest visualized. Family remains at bedside. Administered PRN Robinul to reduce mucus secretions.Marland KitchenMarland Kitchen

## 2023-04-26 NOTE — TOC Progression Note (Signed)
Transition of Care [facility]) - Progression Note    Patient Details  Name: Aaron Leon MRN: [id number] Date of Birth: May 22, 1950  Transition of Care [facility]) CM/SW Contact  Catalina Gravel, LCSW Phone Number: 03/30/2023, 10:13 AM  Clinical Narrative:     CSW learned pt has died during progression update.  CSW notified Gertie Exon as the plan was to assess for inpatient Hospice. No further TOC needs.   Expected Discharge Plan: Home w Home Health Services Barriers to Discharge: Continued Medical Work up  Expected Discharge Plan and Services In-house Referral: Clinical Social Work   Post Acute Care Choice: Horticulturist, commercial, Home Health Living arrangements for the past 2 months: Single Family Home                   D[facility] Agency:  ([facility] [place]                   Social Determinants of Health (SDOH) Interventions SDOH Screenings   Food Insecurity: No Food Insecurity (04/17/2023)  Housing: Low Risk  (04/17/2023)  Transportation Needs: No Transportation Needs (04/17/2023)  Utilities: Not At Risk (04/17/2023)  Financial Resource Strain: Medium Risk (06/05/2022)   Received from [facility]  Social Connections: Unknown (04/17/2023)  Stress: No Stress Concern Present (06/01/2022)   Received from [facility]  Tobacco Use: Medium Risk (04/16/2023)    Readmission Risk Interventions    04/18/2023    3:24 PM 04/17/2023    9:16 AM 01/08/2023    2:01 PM  Readmission Risk Prevention Plan  Transportation Screening Complete Complete Complete  HRI or Home Care Consult  Complete   Social Work Consult for Recovery Care Planning/Counseling  Complete   Palliative Care Screening  Not Applicable   Medication Review Oceanographer) Complete Complete Complete  HRI or Home Care Consult Complete  Complete  SW Recovery Care/Counseling Consult Complete  Complete  Palliative Care Screening Complete  Not Applicable  Skilled Nursing Facility Not Complete   Not Applicable  SNF Comments Residential hospice recommended

## 2023-04-26 NOTE — Death Summary Note (Signed)
DEATH SUMMARY   Patient Details  Name: Aaron Leon MRN: [id number] DOB: 1951-02-17 WUJ:[id number], [name] Admission/Discharge Information   Admit Date:  Apr 26, 2023  Date of Death: Date of Death: 04-29-2023  Time of Death: Time of Death: 0653  Length of Stay: 3   Principle Cause of death: Aspiration pneumonia  Hospital Diagnoses: Principal Problem:   Aspiration pneumonia (HCC) Active Problems:   Pulmonary embolism (HCC)   Essential hypertension   Dementia with behavioral disturbance (HCC)   Acute on chronic respiratory failure with hypoxia (HCC)   Lactic acidosis   Elevated brain natriuretic peptide (BNP) level   Hypokalemia   Hypoalbuminemia due to protein-calorie malnutrition (HCC)   COPD (chronic obstructive pulmonary disease) (HCC)   End of life care   Pressure injury of skin   Failure to thrive in adult   Hospital Course: 73 year old M with PMH of COPD/chronic hypoxic RF on 3 L, CVA, IDDM-2, chronic pain, Parkinson disease, dementia and PE not on AC brought to ED by EMS due to progressive SOB, hypotension, hypoxemia and decreased p.o. intake, and admitted with working diagnosis of community-acquired pneumonia, hypothermia and lactic acidosis.  Reportedly had decreased p.o. intake for about a week or 2.  He was also hypotensive to 70s/50s and hypoxic to 80s that prompted patient's wife to activate EMS.     In ED, hypothermic to 95.  SBP as low as 80.  DBP as low as 44.  Saturating at 94% on 5 L.  WBC 16.6 with left shift.  Lactic acid 2.1>>> 2.8.  CXR with patchy bilateral airspace disease, right > left concerning for pneumonia.  Cultures obtained.  Patient was started on IV fluid, CTX, Zithromax and potassium supplementation.   Patient remained hypotensive despite aggressive IV fluid resuscitation.  Also increased oxygen requirement likely from aspiration.  Given concern for failure to thrive and grim prognosis, patient was transition to full comfort care after  further goal of care discussion with patient's wife on April 27, 2022.    Patient passed away on 2022-04-28 at 6:53 AM.  Assessment and Plan: End-of-life care/full comfort care/advance care plan/DNR/DNI: After discussion with patient's wife at bedside about his current condition, poor prognosis and poor quality of life, patient's wife felt full comfort care is to his best interest. -Initiated full comfort care-orders in place. -Will continue IV Zosyn while in house -[facility] consulted for residential hospice placement.   Acute on chronic respiratory failure with hypoxia due to aspiration pneumonia.  Presents with progressive shortness of breath, cough, hypoxemia and hypotension.  Patient is on 3 L at baseline.  CXR with bibasilar infiltrate, right > left.  Has leukocytosis with left shift.  Pro-Cal elevated to 3.66.  Blood cultures NGTD   Chronic COPD: Doubt exacerbation.   Parkinson's disease/dementia without behavioral disturbance: Stable.  Awake and alert but only oriented to self, wife and "hospital".   Hypotension/history of essential hypertension: Reportedly hypotensive to 70s/50s at home.  SBP in 80s with DBP in 40s in ED. on low-dose lisinopril, metoprolol, Lasix and Flomax at home.  He is also on baclofen and oral oxycodone which could contribute.  Started on Levophed overnight.   IDDM-2 with hyperglycemia: A1c 7.0% in 12/2022.  Seems to be on insulin and metformin at home.   Hypokalemia/hypomagnesemia   Lactic acidosis possibly due to above: Resolved.   History of pulmonary embolism: Per wife, no longer and Eliquis.   History of CVA: seems full dose aspirin and statin.   Chronic pain/insomnia: On Percocet,  baclofen, gabapentin and Ambien.  Definitely at risk for polypharmacy.   Ambulatory dysfunction/wheelchair dependence  Adult failure to thrive   Decreased oral intake        Procedures: None  Consultations: Palliative medicine  The results of significant diagnostics from  this hospitalization (including imaging, microbiology, ancillary and laboratory) are listed below for reference.   Significant Diagnostic Studies: DG Chest Port 1 View Result Date: 04/18/2023 CLINICAL DATA:  73 year old male with shortness of breath. COPD, former smoker. EXAM: PORTABLE CHEST 1 VIEW COMPARISON:  Portable chest 04/16/2023 and earlier. FINDINGS: Portable AP semi upright view at 0902 hours. Ongoing patchy and confluent multifocal right lung opacity in the upper lobe, bordering the minor fissure and at the lung base. Larger lung volumes. Stable cardiac size and mediastinal contours. Calcified aortic atherosclerosis. Left lung appears negative. No pneumothorax. No definite pleural effusion. No pulmonary edema. Anterior and posterior cervical spine fusion hardware. Osteopenia. IMPRESSION: Ongoing multifocal right lung opacity suspicious for Multilobar Pneumonia. No new cardiopulmonary abnormality. Electronically Signed   By: Odessa Fleming M.D.   On: 04/18/2023 09:49   DG Chest Port 1 View Result Date: 04/16/2023 CLINICAL DATA:  Questionable sepsis EXAM: PORTABLE CHEST 1 VIEW COMPARISON:  01/07/2023 FINDINGS: Heart normal size. Aortic atherosclerosis. Patchy bilateral airspace disease, right greater than left. Possible small right effusion. No acute bony abnormality. IMPRESSION: Patchy bilateral airspace disease, right greater than left concerning for pneumonia. Electronically Signed   By: Charlett Nose M.D.   On: 04/16/2023 22:14    Microbiology: Recent Results (from the past 240 hours)  Blood Culture (routine x 2)     Status: None (Preliminary result)   Collection Time: 04/16/23  9:32 PM   Specimen: BLOOD RIGHT FOREARM  Result Value Ref Range Status   Specimen Description BLOOD RIGHT FOREARM BOTTLES DRAWN AEROBIC ONLY  Final   Special Requests   Final    Blood Culture results may not be optimal due to an inadequate volume of blood received in culture bottles   Culture   Final    NO GROWTH 3  DAYS Performed at [facility], [address], [place], [place] [address]    Report Status PENDING  Incomplete  Blood Culture (routine x 2)     Status: None (Preliminary result)   Collection Time: 04/16/23  9:32 PM   Specimen: Right Antecubital; Blood  Result Value Ref Range Status   Specimen Description   Final    RIGHT ANTECUBITAL BOTTLES DRAWN AEROBIC AND ANAEROBIC   Special Requests Blood Culture adequate volume  Final   Culture   Final    NO GROWTH 3 DAYS Performed at [facility], [address] [place], [place] [address]    Report Status PENDING  Incomplete  Respiratory (~20 pathogens) panel by PCR     Status: None   Collection Time: 04/17/23 10:04 AM   Specimen: Nasopharyngeal Swab; Respiratory  Result Value Ref Range Status   Adenovirus NOT DETECTED NOT DETECTED Final   Coronavirus 229E NOT DETECTED NOT DETECTED Final    Comment: (NOTE) The Coronavirus on the Respiratory Panel, DOES NOT test for the novel  Coronavirus (2019 nCoV)    Coronavirus HKU1 NOT DETECTED NOT DETECTED Final   Coronavirus NL63 NOT DETECTED NOT DETECTED Final   Coronavirus OC43 NOT DETECTED NOT DETECTED Final   Metapneumovirus NOT DETECTED NOT DETECTED Final   Rhinovirus / Enterovirus NOT DETECTED NOT DETECTED Final   Influenza A NOT DETECTED NOT DETECTED Final   Influenza B NOT  DETECTED NOT DETECTED Final   Parainfluenza Virus 1 NOT DETECTED NOT DETECTED Final   Parainfluenza Virus 2 NOT DETECTED NOT DETECTED Final   Parainfluenza Virus 3 NOT DETECTED NOT DETECTED Final   Parainfluenza Virus 4 NOT DETECTED NOT DETECTED Final   Respiratory Syncytial Virus NOT DETECTED NOT DETECTED Final   Bordetella pertussis NOT DETECTED NOT DETECTED Final   Bordetella Parapertussis NOT DETECTED NOT DETECTED Final   Chlamydophila pneumoniae NOT DETECTED NOT DETECTED Final   Mycoplasma pneumoniae NOT DETECTED NOT DETECTED Final    Comment: Performed at [facility], [address], [place], [place]  [address]  Resp panel by RT-PCR (RSV, Flu A&B, Covid) Anterior Nasal Swab     Status: None   Collection Time: 04/17/23  2:55 PM   Specimen: Anterior Nasal Swab  Result Value Ref Range Status   SARS Coronavirus 2 by RT PCR NEGATIVE NEGATIVE Final    Comment: (NOTE) SARS-CoV-2 target nucleic acids are NOT DETECTED.  The SARS-CoV-2 RNA is generally detectable in upper respiratory specimens during the acute phase of infection. The lowest concentration of SARS-CoV-2 viral copies this assay can detect is 138 copies/mL. A negative result does not preclude SARS-Cov-2 infection and should not be used as the sole basis for treatment or other patient management decisions. A negative result may occur with  improper specimen collection/handling, submission of specimen other than nasopharyngeal swab, presence of viral mutation(s) within the areas targeted by this assay, and inadequate number of viral copies(<138 copies/mL). A negative result must be combined with clinical observations, patient history, and epidemiological information. The expected result is Negative.  Fact Sheet for Patients:  BloggerCourse.com  Fact Sheet for Healthcare Providers:  SeriousBroker.it  This test is no t yet approved or cleared by the [place] FDA and  has been authorized for detection and/or diagnosis of SARS-CoV-2 by FDA under an Emergency Use Authorization (EUA). This EUA will remain  in effect (meaning this test can be used) for the duration of the COVID-19 declaration under Section 564(b)(1) of the Act, 21 U.S.C.section 360bbb-3(b)(1), unless the authorization is terminated  or revoked sooner.       Influenza A by PCR NEGATIVE NEGATIVE Final   Influenza B by PCR NEGATIVE NEGATIVE Final    Comment: (NOTE) The Xpert Xpress SARS-CoV-2/FLU/RSV plus assay is intended as an aid in the diagnosis of influenza from Nasopharyngeal swab specimens and should not be  used as a sole basis for treatment. Nasal washings and aspirates are unacceptable for Xpert Xpress SARS-CoV-2/FLU/RSV testing.  Fact Sheet for Patients: BloggerCourse.com  Fact Sheet for Healthcare Providers: SeriousBroker.it  This test is not yet approved or cleared by the [place] FDA and has been authorized for detection and/or diagnosis of SARS-CoV-2 by FDA under an Emergency Use Authorization (EUA). This EUA will remain in effect (meaning this test can be used) for the duration of the COVID-19 declaration under Section 564(b)(1) of the Act, 21 U.S.C. section 360bbb-3(b)(1), unless the authorization is terminated or revoked.     Resp Syncytial Virus by PCR NEGATIVE NEGATIVE Final    Comment: (NOTE) Fact Sheet for Patients: BloggerCourse.com  Fact Sheet for Healthcare Providers: SeriousBroker.it  This test is not yet approved or cleared by the [place] FDA and has been authorized for detection and/or diagnosis of SARS-CoV-2 by FDA under an Emergency Use Authorization (EUA). This EUA will remain in effect (meaning this test can be used) for the duration of the COVID-19 declaration under Section 564(b)(1) of  the Act, 21 U.S.C. section 360bbb-3(b)(1), unless the authorization is terminated or revoked.  Performed at [facility], [address], [place], [place] [address]     Time spent: 35 minutes  Signed: Almon Hercules, MD 04/13/2023

## 2023-04-26 DEATH — deceased
